# Patient Record
Sex: Female | Born: 1935
Health system: Southern US, Community
[De-identification: ages and names within clinical notes are randomized; demographics above are authoritative.]

## PROBLEM LIST (undated history)

## (undated) DIAGNOSIS — I739 Peripheral vascular disease, unspecified: Secondary | ICD-10-CM

## (undated) DIAGNOSIS — I2510 Atherosclerotic heart disease of native coronary artery without angina pectoris: Secondary | ICD-10-CM

## (undated) DIAGNOSIS — E119 Type 2 diabetes mellitus without complications: Secondary | ICD-10-CM

## (undated) DIAGNOSIS — D509 Iron deficiency anemia, unspecified: Secondary | ICD-10-CM

## (undated) DIAGNOSIS — K5790 Diverticulosis of intestine, part unspecified, without perforation or abscess without bleeding: Secondary | ICD-10-CM

## (undated) DIAGNOSIS — J309 Allergic rhinitis, unspecified: Secondary | ICD-10-CM

## (undated) DIAGNOSIS — I10 Essential (primary) hypertension: Secondary | ICD-10-CM

## (undated) DIAGNOSIS — E785 Hyperlipidemia, unspecified: Secondary | ICD-10-CM

## (undated) DIAGNOSIS — I5022 Chronic systolic (congestive) heart failure: Secondary | ICD-10-CM

## (undated) DIAGNOSIS — D481 Neoplasm of uncertain behavior of connective and other soft tissue: Secondary | ICD-10-CM

## (undated) DIAGNOSIS — G629 Polyneuropathy, unspecified: Secondary | ICD-10-CM

## (undated) HISTORY — DX: Type 2 diabetes mellitus without complications: E11.9

## (undated) HISTORY — PX: OTHER SURGICAL HISTORY: SHX169

## (undated) HISTORY — DX: Polyneuropathy, unspecified: G62.9

## (undated) HISTORY — DX: Atherosclerotic heart disease of native coronary artery without angina pectoris: I25.10

## (undated) HISTORY — DX: Diverticulosis of intestine, part unspecified, without perforation or abscess without bleeding: K57.90

## (undated) HISTORY — PX: BACK SURGERY: SHX140

## (undated) HISTORY — DX: Hyperlipidemia, unspecified: E78.5

## (undated) HISTORY — DX: Peripheral vascular disease, unspecified: I73.9

## (undated) HISTORY — DX: Iron deficiency anemia, unspecified: D50.9

## (undated) HISTORY — DX: Allergic rhinitis, unspecified: J30.9

---

## 2003-08-02 ENCOUNTER — Ambulatory Visit (HOSPITAL_BASED_OUTPATIENT_CLINIC_OR_DEPARTMENT_OTHER): Admission: RE | Admit: 2003-08-02 | Discharge: 2003-08-02 | Payer: Self-pay | Admitting: Orthopedic Surgery

## 2003-08-02 ENCOUNTER — Encounter: Payer: Self-pay | Admitting: Orthopedic Surgery

## 2003-09-22 ENCOUNTER — Ambulatory Visit (HOSPITAL_COMMUNITY): Admission: RE | Admit: 2003-09-22 | Discharge: 2003-09-22 | Payer: Self-pay | Admitting: Orthopedic Surgery

## 2003-09-22 ENCOUNTER — Ambulatory Visit (HOSPITAL_BASED_OUTPATIENT_CLINIC_OR_DEPARTMENT_OTHER): Admission: RE | Admit: 2003-09-22 | Discharge: 2003-09-22 | Payer: Self-pay | Admitting: Orthopedic Surgery

## 2009-12-01 HISTORY — PX: CORONARY ARTERY BYPASS GRAFT: SHX141

## 2013-12-08 DIAGNOSIS — E782 Mixed hyperlipidemia: Secondary | ICD-10-CM | POA: Diagnosis not present

## 2013-12-08 DIAGNOSIS — I10 Essential (primary) hypertension: Secondary | ICD-10-CM | POA: Diagnosis not present

## 2013-12-08 DIAGNOSIS — N390 Urinary tract infection, site not specified: Secondary | ICD-10-CM | POA: Diagnosis not present

## 2013-12-08 DIAGNOSIS — R7309 Other abnormal glucose: Secondary | ICD-10-CM | POA: Diagnosis not present

## 2013-12-08 DIAGNOSIS — E119 Type 2 diabetes mellitus without complications: Secondary | ICD-10-CM | POA: Diagnosis not present

## 2013-12-08 DIAGNOSIS — Z0000 Encounter for general adult medical examination without abnormal findings: Secondary | ICD-10-CM | POA: Diagnosis not present

## 2013-12-08 DIAGNOSIS — F411 Generalized anxiety disorder: Secondary | ICD-10-CM | POA: Diagnosis not present

## 2013-12-14 DIAGNOSIS — E782 Mixed hyperlipidemia: Secondary | ICD-10-CM | POA: Diagnosis not present

## 2013-12-14 DIAGNOSIS — I2510 Atherosclerotic heart disease of native coronary artery without angina pectoris: Secondary | ICD-10-CM | POA: Diagnosis not present

## 2013-12-14 DIAGNOSIS — Z6828 Body mass index (BMI) 28.0-28.9, adult: Secondary | ICD-10-CM | POA: Diagnosis not present

## 2013-12-14 DIAGNOSIS — I10 Essential (primary) hypertension: Secondary | ICD-10-CM | POA: Diagnosis not present

## 2013-12-14 DIAGNOSIS — F411 Generalized anxiety disorder: Secondary | ICD-10-CM | POA: Diagnosis not present

## 2013-12-14 DIAGNOSIS — E119 Type 2 diabetes mellitus without complications: Secondary | ICD-10-CM | POA: Diagnosis not present

## 2013-12-22 DIAGNOSIS — E1149 Type 2 diabetes mellitus with other diabetic neurological complication: Secondary | ICD-10-CM | POA: Diagnosis not present

## 2013-12-22 DIAGNOSIS — B351 Tinea unguium: Secondary | ICD-10-CM | POA: Diagnosis not present

## 2013-12-27 DIAGNOSIS — I6529 Occlusion and stenosis of unspecified carotid artery: Secondary | ICD-10-CM | POA: Diagnosis not present

## 2013-12-27 DIAGNOSIS — I2510 Atherosclerotic heart disease of native coronary artery without angina pectoris: Secondary | ICD-10-CM | POA: Diagnosis not present

## 2013-12-27 DIAGNOSIS — I4891 Unspecified atrial fibrillation: Secondary | ICD-10-CM | POA: Diagnosis not present

## 2013-12-27 DIAGNOSIS — I2589 Other forms of chronic ischemic heart disease: Secondary | ICD-10-CM | POA: Diagnosis not present

## 2013-12-27 DIAGNOSIS — E119 Type 2 diabetes mellitus without complications: Secondary | ICD-10-CM | POA: Diagnosis not present

## 2013-12-27 DIAGNOSIS — E785 Hyperlipidemia, unspecified: Secondary | ICD-10-CM | POA: Diagnosis not present

## 2013-12-27 DIAGNOSIS — I10 Essential (primary) hypertension: Secondary | ICD-10-CM | POA: Diagnosis not present

## 2013-12-27 DIAGNOSIS — I059 Rheumatic mitral valve disease, unspecified: Secondary | ICD-10-CM | POA: Diagnosis not present

## 2013-12-29 DIAGNOSIS — Z79899 Other long term (current) drug therapy: Secondary | ICD-10-CM | POA: Diagnosis not present

## 2013-12-29 DIAGNOSIS — M5030 Other cervical disc degeneration, unspecified cervical region: Secondary | ICD-10-CM | POA: Diagnosis not present

## 2013-12-29 DIAGNOSIS — M5137 Other intervertebral disc degeneration, lumbosacral region: Secondary | ICD-10-CM | POA: Diagnosis not present

## 2014-01-12 DIAGNOSIS — Z7901 Long term (current) use of anticoagulants: Secondary | ICD-10-CM | POA: Diagnosis not present

## 2014-01-12 DIAGNOSIS — S0100XA Unspecified open wound of scalp, initial encounter: Secondary | ICD-10-CM | POA: Diagnosis not present

## 2014-01-12 DIAGNOSIS — M542 Cervicalgia: Secondary | ICD-10-CM | POA: Diagnosis not present

## 2014-01-12 DIAGNOSIS — T1490XA Injury, unspecified, initial encounter: Secondary | ICD-10-CM | POA: Diagnosis not present

## 2014-01-12 DIAGNOSIS — S0990XA Unspecified injury of head, initial encounter: Secondary | ICD-10-CM | POA: Diagnosis not present

## 2014-01-12 DIAGNOSIS — S42009A Fracture of unspecified part of unspecified clavicle, initial encounter for closed fracture: Secondary | ICD-10-CM | POA: Diagnosis not present

## 2014-01-12 DIAGNOSIS — R079 Chest pain, unspecified: Secondary | ICD-10-CM | POA: Diagnosis not present

## 2014-01-12 DIAGNOSIS — R9431 Abnormal electrocardiogram [ECG] [EKG]: Secondary | ICD-10-CM | POA: Diagnosis not present

## 2014-01-12 DIAGNOSIS — R51 Headache: Secondary | ICD-10-CM | POA: Diagnosis not present

## 2014-01-12 DIAGNOSIS — M549 Dorsalgia, unspecified: Secondary | ICD-10-CM | POA: Diagnosis not present

## 2014-01-19 DIAGNOSIS — R269 Unspecified abnormalities of gait and mobility: Secondary | ICD-10-CM | POA: Diagnosis not present

## 2014-01-19 DIAGNOSIS — Z6827 Body mass index (BMI) 27.0-27.9, adult: Secondary | ICD-10-CM | POA: Diagnosis not present

## 2014-02-13 DIAGNOSIS — Z6826 Body mass index (BMI) 26.0-26.9, adult: Secondary | ICD-10-CM | POA: Diagnosis not present

## 2014-02-13 DIAGNOSIS — I10 Essential (primary) hypertension: Secondary | ICD-10-CM | POA: Diagnosis not present

## 2014-02-13 DIAGNOSIS — E119 Type 2 diabetes mellitus without complications: Secondary | ICD-10-CM | POA: Diagnosis not present

## 2014-02-13 DIAGNOSIS — I2510 Atherosclerotic heart disease of native coronary artery without angina pectoris: Secondary | ICD-10-CM | POA: Diagnosis not present

## 2014-02-13 DIAGNOSIS — R269 Unspecified abnormalities of gait and mobility: Secondary | ICD-10-CM | POA: Diagnosis not present

## 2014-02-13 DIAGNOSIS — F411 Generalized anxiety disorder: Secondary | ICD-10-CM | POA: Diagnosis not present

## 2014-02-13 DIAGNOSIS — E782 Mixed hyperlipidemia: Secondary | ICD-10-CM | POA: Diagnosis not present

## 2014-02-15 DIAGNOSIS — M810 Age-related osteoporosis without current pathological fracture: Secondary | ICD-10-CM | POA: Diagnosis not present

## 2014-02-15 DIAGNOSIS — Z79899 Other long term (current) drug therapy: Secondary | ICD-10-CM | POA: Diagnosis not present

## 2014-02-15 DIAGNOSIS — M461 Sacroiliitis, not elsewhere classified: Secondary | ICD-10-CM | POA: Diagnosis not present

## 2014-02-15 DIAGNOSIS — M961 Postlaminectomy syndrome, not elsewhere classified: Secondary | ICD-10-CM | POA: Diagnosis not present

## 2014-02-23 DIAGNOSIS — E1149 Type 2 diabetes mellitus with other diabetic neurological complication: Secondary | ICD-10-CM | POA: Diagnosis not present

## 2014-02-23 DIAGNOSIS — R609 Edema, unspecified: Secondary | ICD-10-CM | POA: Diagnosis not present

## 2014-02-23 DIAGNOSIS — B351 Tinea unguium: Secondary | ICD-10-CM | POA: Diagnosis not present

## 2014-02-23 DIAGNOSIS — M79609 Pain in unspecified limb: Secondary | ICD-10-CM | POA: Diagnosis not present

## 2014-02-24 DIAGNOSIS — N390 Urinary tract infection, site not specified: Secondary | ICD-10-CM | POA: Diagnosis not present

## 2014-02-24 DIAGNOSIS — M79609 Pain in unspecified limb: Secondary | ICD-10-CM | POA: Diagnosis not present

## 2014-02-24 DIAGNOSIS — E1149 Type 2 diabetes mellitus with other diabetic neurological complication: Secondary | ICD-10-CM | POA: Diagnosis not present

## 2014-02-24 DIAGNOSIS — E1142 Type 2 diabetes mellitus with diabetic polyneuropathy: Secondary | ICD-10-CM | POA: Diagnosis not present

## 2014-02-24 DIAGNOSIS — R509 Fever, unspecified: Secondary | ICD-10-CM | POA: Diagnosis not present

## 2014-03-31 DIAGNOSIS — S72109A Unspecified trochanteric fracture of unspecified femur, initial encounter for closed fracture: Secondary | ICD-10-CM | POA: Diagnosis not present

## 2014-03-31 DIAGNOSIS — R269 Unspecified abnormalities of gait and mobility: Secondary | ICD-10-CM | POA: Diagnosis present

## 2014-03-31 DIAGNOSIS — I4891 Unspecified atrial fibrillation: Secondary | ICD-10-CM | POA: Diagnosis not present

## 2014-03-31 DIAGNOSIS — M47817 Spondylosis without myelopathy or radiculopathy, lumbosacral region: Secondary | ICD-10-CM | POA: Diagnosis present

## 2014-03-31 DIAGNOSIS — T1490XA Injury, unspecified, initial encounter: Secondary | ICD-10-CM | POA: Diagnosis not present

## 2014-03-31 DIAGNOSIS — Z951 Presence of aortocoronary bypass graft: Secondary | ICD-10-CM | POA: Diagnosis not present

## 2014-03-31 DIAGNOSIS — R42 Dizziness and giddiness: Secondary | ICD-10-CM | POA: Diagnosis present

## 2014-03-31 DIAGNOSIS — I498 Other specified cardiac arrhythmias: Secondary | ICD-10-CM | POA: Diagnosis not present

## 2014-03-31 DIAGNOSIS — G2581 Restless legs syndrome: Secondary | ICD-10-CM | POA: Diagnosis present

## 2014-03-31 DIAGNOSIS — S7290XA Unspecified fracture of unspecified femur, initial encounter for closed fracture: Secondary | ICD-10-CM | POA: Diagnosis not present

## 2014-03-31 DIAGNOSIS — G988 Other disorders of nervous system: Secondary | ICD-10-CM | POA: Diagnosis not present

## 2014-03-31 DIAGNOSIS — R079 Chest pain, unspecified: Secondary | ICD-10-CM | POA: Diagnosis not present

## 2014-03-31 DIAGNOSIS — G5790 Unspecified mononeuropathy of unspecified lower limb: Secondary | ICD-10-CM | POA: Diagnosis not present

## 2014-03-31 DIAGNOSIS — I10 Essential (primary) hypertension: Secondary | ICD-10-CM | POA: Diagnosis present

## 2014-03-31 DIAGNOSIS — M25559 Pain in unspecified hip: Secondary | ICD-10-CM | POA: Diagnosis not present

## 2014-03-31 DIAGNOSIS — E119 Type 2 diabetes mellitus without complications: Secondary | ICD-10-CM | POA: Diagnosis not present

## 2014-03-31 DIAGNOSIS — J449 Chronic obstructive pulmonary disease, unspecified: Secondary | ICD-10-CM | POA: Diagnosis present

## 2014-04-03 DIAGNOSIS — E119 Type 2 diabetes mellitus without complications: Secondary | ICD-10-CM | POA: Diagnosis not present

## 2014-04-03 DIAGNOSIS — G609 Hereditary and idiopathic neuropathy, unspecified: Secondary | ICD-10-CM | POA: Diagnosis not present

## 2014-04-03 DIAGNOSIS — I2510 Atherosclerotic heart disease of native coronary artery without angina pectoris: Secondary | ICD-10-CM | POA: Diagnosis not present

## 2014-04-03 DIAGNOSIS — W19XXXA Unspecified fall, initial encounter: Secondary | ICD-10-CM | POA: Diagnosis not present

## 2014-04-03 DIAGNOSIS — S72009D Fracture of unspecified part of neck of unspecified femur, subsequent encounter for closed fracture with routine healing: Secondary | ICD-10-CM | POA: Diagnosis not present

## 2014-04-03 DIAGNOSIS — S60919A Unspecified superficial injury of unspecified wrist, initial encounter: Secondary | ICD-10-CM | POA: Diagnosis not present

## 2014-04-03 DIAGNOSIS — J449 Chronic obstructive pulmonary disease, unspecified: Secondary | ICD-10-CM | POA: Diagnosis not present

## 2014-04-03 DIAGNOSIS — I739 Peripheral vascular disease, unspecified: Secondary | ICD-10-CM | POA: Diagnosis not present

## 2014-04-03 DIAGNOSIS — I10 Essential (primary) hypertension: Secondary | ICD-10-CM | POA: Diagnosis not present

## 2014-04-03 DIAGNOSIS — S50909A Unspecified superficial injury of unspecified elbow, initial encounter: Secondary | ICD-10-CM | POA: Diagnosis not present

## 2014-04-03 DIAGNOSIS — M129 Arthropathy, unspecified: Secondary | ICD-10-CM | POA: Diagnosis not present

## 2014-04-04 DIAGNOSIS — I739 Peripheral vascular disease, unspecified: Secondary | ICD-10-CM | POA: Diagnosis not present

## 2014-04-04 DIAGNOSIS — G609 Hereditary and idiopathic neuropathy, unspecified: Secondary | ICD-10-CM | POA: Diagnosis not present

## 2014-04-04 DIAGNOSIS — I2510 Atherosclerotic heart disease of native coronary artery without angina pectoris: Secondary | ICD-10-CM | POA: Diagnosis not present

## 2014-04-04 DIAGNOSIS — E119 Type 2 diabetes mellitus without complications: Secondary | ICD-10-CM | POA: Diagnosis not present

## 2014-04-04 DIAGNOSIS — J449 Chronic obstructive pulmonary disease, unspecified: Secondary | ICD-10-CM | POA: Diagnosis not present

## 2014-04-04 DIAGNOSIS — S72009D Fracture of unspecified part of neck of unspecified femur, subsequent encounter for closed fracture with routine healing: Secondary | ICD-10-CM | POA: Diagnosis not present

## 2014-04-05 DIAGNOSIS — I2510 Atherosclerotic heart disease of native coronary artery without angina pectoris: Secondary | ICD-10-CM | POA: Diagnosis not present

## 2014-04-05 DIAGNOSIS — G609 Hereditary and idiopathic neuropathy, unspecified: Secondary | ICD-10-CM | POA: Diagnosis not present

## 2014-04-05 DIAGNOSIS — J449 Chronic obstructive pulmonary disease, unspecified: Secondary | ICD-10-CM | POA: Diagnosis not present

## 2014-04-05 DIAGNOSIS — S72009D Fracture of unspecified part of neck of unspecified femur, subsequent encounter for closed fracture with routine healing: Secondary | ICD-10-CM | POA: Diagnosis not present

## 2014-04-05 DIAGNOSIS — I739 Peripheral vascular disease, unspecified: Secondary | ICD-10-CM | POA: Diagnosis not present

## 2014-04-05 DIAGNOSIS — E119 Type 2 diabetes mellitus without complications: Secondary | ICD-10-CM | POA: Diagnosis not present

## 2014-04-07 DIAGNOSIS — E119 Type 2 diabetes mellitus without complications: Secondary | ICD-10-CM | POA: Diagnosis not present

## 2014-04-07 DIAGNOSIS — I2510 Atherosclerotic heart disease of native coronary artery without angina pectoris: Secondary | ICD-10-CM | POA: Diagnosis not present

## 2014-04-07 DIAGNOSIS — G609 Hereditary and idiopathic neuropathy, unspecified: Secondary | ICD-10-CM | POA: Diagnosis not present

## 2014-04-07 DIAGNOSIS — S72009D Fracture of unspecified part of neck of unspecified femur, subsequent encounter for closed fracture with routine healing: Secondary | ICD-10-CM | POA: Diagnosis not present

## 2014-04-07 DIAGNOSIS — J449 Chronic obstructive pulmonary disease, unspecified: Secondary | ICD-10-CM | POA: Diagnosis not present

## 2014-04-07 DIAGNOSIS — I739 Peripheral vascular disease, unspecified: Secondary | ICD-10-CM | POA: Diagnosis not present

## 2014-04-10 DIAGNOSIS — I739 Peripheral vascular disease, unspecified: Secondary | ICD-10-CM | POA: Diagnosis not present

## 2014-04-10 DIAGNOSIS — I2510 Atherosclerotic heart disease of native coronary artery without angina pectoris: Secondary | ICD-10-CM | POA: Diagnosis not present

## 2014-04-10 DIAGNOSIS — G609 Hereditary and idiopathic neuropathy, unspecified: Secondary | ICD-10-CM | POA: Diagnosis not present

## 2014-04-10 DIAGNOSIS — E119 Type 2 diabetes mellitus without complications: Secondary | ICD-10-CM | POA: Diagnosis not present

## 2014-04-10 DIAGNOSIS — J449 Chronic obstructive pulmonary disease, unspecified: Secondary | ICD-10-CM | POA: Diagnosis not present

## 2014-04-10 DIAGNOSIS — S72009D Fracture of unspecified part of neck of unspecified femur, subsequent encounter for closed fracture with routine healing: Secondary | ICD-10-CM | POA: Diagnosis not present

## 2014-04-12 DIAGNOSIS — I739 Peripheral vascular disease, unspecified: Secondary | ICD-10-CM | POA: Diagnosis not present

## 2014-04-12 DIAGNOSIS — I2510 Atherosclerotic heart disease of native coronary artery without angina pectoris: Secondary | ICD-10-CM | POA: Diagnosis not present

## 2014-04-12 DIAGNOSIS — E119 Type 2 diabetes mellitus without complications: Secondary | ICD-10-CM | POA: Diagnosis not present

## 2014-04-12 DIAGNOSIS — J449 Chronic obstructive pulmonary disease, unspecified: Secondary | ICD-10-CM | POA: Diagnosis not present

## 2014-04-12 DIAGNOSIS — S72009D Fracture of unspecified part of neck of unspecified femur, subsequent encounter for closed fracture with routine healing: Secondary | ICD-10-CM | POA: Diagnosis not present

## 2014-04-12 DIAGNOSIS — G609 Hereditary and idiopathic neuropathy, unspecified: Secondary | ICD-10-CM | POA: Diagnosis not present

## 2014-04-13 DIAGNOSIS — M461 Sacroiliitis, not elsewhere classified: Secondary | ICD-10-CM | POA: Diagnosis not present

## 2014-04-13 DIAGNOSIS — R262 Difficulty in walking, not elsewhere classified: Secondary | ICD-10-CM | POA: Diagnosis not present

## 2014-04-13 DIAGNOSIS — Z79899 Other long term (current) drug therapy: Secondary | ICD-10-CM | POA: Diagnosis not present

## 2014-04-13 DIAGNOSIS — S3210XA Unspecified fracture of sacrum, initial encounter for closed fracture: Secondary | ICD-10-CM | POA: Diagnosis not present

## 2014-04-13 DIAGNOSIS — M961 Postlaminectomy syndrome, not elsewhere classified: Secondary | ICD-10-CM | POA: Diagnosis not present

## 2014-04-14 DIAGNOSIS — J449 Chronic obstructive pulmonary disease, unspecified: Secondary | ICD-10-CM | POA: Diagnosis not present

## 2014-04-14 DIAGNOSIS — I2510 Atherosclerotic heart disease of native coronary artery without angina pectoris: Secondary | ICD-10-CM | POA: Diagnosis not present

## 2014-04-14 DIAGNOSIS — G609 Hereditary and idiopathic neuropathy, unspecified: Secondary | ICD-10-CM | POA: Diagnosis not present

## 2014-04-14 DIAGNOSIS — S72009D Fracture of unspecified part of neck of unspecified femur, subsequent encounter for closed fracture with routine healing: Secondary | ICD-10-CM | POA: Diagnosis not present

## 2014-04-14 DIAGNOSIS — I739 Peripheral vascular disease, unspecified: Secondary | ICD-10-CM | POA: Diagnosis not present

## 2014-04-14 DIAGNOSIS — E119 Type 2 diabetes mellitus without complications: Secondary | ICD-10-CM | POA: Diagnosis not present

## 2014-04-18 DIAGNOSIS — J449 Chronic obstructive pulmonary disease, unspecified: Secondary | ICD-10-CM | POA: Diagnosis not present

## 2014-04-18 DIAGNOSIS — S72009D Fracture of unspecified part of neck of unspecified femur, subsequent encounter for closed fracture with routine healing: Secondary | ICD-10-CM | POA: Diagnosis not present

## 2014-04-18 DIAGNOSIS — G609 Hereditary and idiopathic neuropathy, unspecified: Secondary | ICD-10-CM | POA: Diagnosis not present

## 2014-04-18 DIAGNOSIS — E119 Type 2 diabetes mellitus without complications: Secondary | ICD-10-CM | POA: Diagnosis not present

## 2014-04-18 DIAGNOSIS — I739 Peripheral vascular disease, unspecified: Secondary | ICD-10-CM | POA: Diagnosis not present

## 2014-04-18 DIAGNOSIS — I2510 Atherosclerotic heart disease of native coronary artery without angina pectoris: Secondary | ICD-10-CM | POA: Diagnosis not present

## 2014-04-19 DIAGNOSIS — I739 Peripheral vascular disease, unspecified: Secondary | ICD-10-CM | POA: Diagnosis not present

## 2014-04-19 DIAGNOSIS — J449 Chronic obstructive pulmonary disease, unspecified: Secondary | ICD-10-CM | POA: Diagnosis not present

## 2014-04-19 DIAGNOSIS — E119 Type 2 diabetes mellitus without complications: Secondary | ICD-10-CM | POA: Diagnosis not present

## 2014-04-19 DIAGNOSIS — I2510 Atherosclerotic heart disease of native coronary artery without angina pectoris: Secondary | ICD-10-CM | POA: Diagnosis not present

## 2014-04-19 DIAGNOSIS — G609 Hereditary and idiopathic neuropathy, unspecified: Secondary | ICD-10-CM | POA: Diagnosis not present

## 2014-04-19 DIAGNOSIS — S72009D Fracture of unspecified part of neck of unspecified femur, subsequent encounter for closed fracture with routine healing: Secondary | ICD-10-CM | POA: Diagnosis not present

## 2014-04-20 DIAGNOSIS — E119 Type 2 diabetes mellitus without complications: Secondary | ICD-10-CM | POA: Diagnosis not present

## 2014-04-20 DIAGNOSIS — S72009D Fracture of unspecified part of neck of unspecified femur, subsequent encounter for closed fracture with routine healing: Secondary | ICD-10-CM | POA: Diagnosis not present

## 2014-04-20 DIAGNOSIS — G609 Hereditary and idiopathic neuropathy, unspecified: Secondary | ICD-10-CM | POA: Diagnosis not present

## 2014-04-20 DIAGNOSIS — I739 Peripheral vascular disease, unspecified: Secondary | ICD-10-CM | POA: Diagnosis not present

## 2014-04-20 DIAGNOSIS — I2510 Atherosclerotic heart disease of native coronary artery without angina pectoris: Secondary | ICD-10-CM | POA: Diagnosis not present

## 2014-04-20 DIAGNOSIS — J449 Chronic obstructive pulmonary disease, unspecified: Secondary | ICD-10-CM | POA: Diagnosis not present

## 2014-04-22 DIAGNOSIS — I739 Peripheral vascular disease, unspecified: Secondary | ICD-10-CM | POA: Diagnosis not present

## 2014-04-22 DIAGNOSIS — S72009D Fracture of unspecified part of neck of unspecified femur, subsequent encounter for closed fracture with routine healing: Secondary | ICD-10-CM | POA: Diagnosis not present

## 2014-04-22 DIAGNOSIS — J449 Chronic obstructive pulmonary disease, unspecified: Secondary | ICD-10-CM | POA: Diagnosis not present

## 2014-04-22 DIAGNOSIS — G609 Hereditary and idiopathic neuropathy, unspecified: Secondary | ICD-10-CM | POA: Diagnosis not present

## 2014-04-22 DIAGNOSIS — I2510 Atherosclerotic heart disease of native coronary artery without angina pectoris: Secondary | ICD-10-CM | POA: Diagnosis not present

## 2014-04-22 DIAGNOSIS — E119 Type 2 diabetes mellitus without complications: Secondary | ICD-10-CM | POA: Diagnosis not present

## 2014-04-23 DIAGNOSIS — H10239 Serous conjunctivitis, except viral, unspecified eye: Secondary | ICD-10-CM | POA: Diagnosis not present

## 2014-04-25 DIAGNOSIS — J449 Chronic obstructive pulmonary disease, unspecified: Secondary | ICD-10-CM | POA: Diagnosis not present

## 2014-04-25 DIAGNOSIS — I739 Peripheral vascular disease, unspecified: Secondary | ICD-10-CM | POA: Diagnosis not present

## 2014-04-25 DIAGNOSIS — E119 Type 2 diabetes mellitus without complications: Secondary | ICD-10-CM | POA: Diagnosis not present

## 2014-04-25 DIAGNOSIS — I2510 Atherosclerotic heart disease of native coronary artery without angina pectoris: Secondary | ICD-10-CM | POA: Diagnosis not present

## 2014-04-25 DIAGNOSIS — S72009D Fracture of unspecified part of neck of unspecified femur, subsequent encounter for closed fracture with routine healing: Secondary | ICD-10-CM | POA: Diagnosis not present

## 2014-04-25 DIAGNOSIS — G609 Hereditary and idiopathic neuropathy, unspecified: Secondary | ICD-10-CM | POA: Diagnosis not present

## 2014-04-27 DIAGNOSIS — R609 Edema, unspecified: Secondary | ICD-10-CM | POA: Diagnosis not present

## 2014-04-27 DIAGNOSIS — B351 Tinea unguium: Secondary | ICD-10-CM | POA: Diagnosis not present

## 2014-04-27 DIAGNOSIS — E1149 Type 2 diabetes mellitus with other diabetic neurological complication: Secondary | ICD-10-CM | POA: Diagnosis not present

## 2014-04-28 DIAGNOSIS — S72009D Fracture of unspecified part of neck of unspecified femur, subsequent encounter for closed fracture with routine healing: Secondary | ICD-10-CM | POA: Diagnosis not present

## 2014-04-28 DIAGNOSIS — I2510 Atherosclerotic heart disease of native coronary artery without angina pectoris: Secondary | ICD-10-CM | POA: Diagnosis not present

## 2014-04-28 DIAGNOSIS — G609 Hereditary and idiopathic neuropathy, unspecified: Secondary | ICD-10-CM | POA: Diagnosis not present

## 2014-04-28 DIAGNOSIS — I739 Peripheral vascular disease, unspecified: Secondary | ICD-10-CM | POA: Diagnosis not present

## 2014-04-28 DIAGNOSIS — E119 Type 2 diabetes mellitus without complications: Secondary | ICD-10-CM | POA: Diagnosis not present

## 2014-04-28 DIAGNOSIS — J449 Chronic obstructive pulmonary disease, unspecified: Secondary | ICD-10-CM | POA: Diagnosis not present

## 2014-04-30 DIAGNOSIS — S72009D Fracture of unspecified part of neck of unspecified femur, subsequent encounter for closed fracture with routine healing: Secondary | ICD-10-CM | POA: Diagnosis not present

## 2014-04-30 DIAGNOSIS — J449 Chronic obstructive pulmonary disease, unspecified: Secondary | ICD-10-CM | POA: Diagnosis not present

## 2014-04-30 DIAGNOSIS — G609 Hereditary and idiopathic neuropathy, unspecified: Secondary | ICD-10-CM | POA: Diagnosis not present

## 2014-04-30 DIAGNOSIS — I2510 Atherosclerotic heart disease of native coronary artery without angina pectoris: Secondary | ICD-10-CM | POA: Diagnosis not present

## 2014-04-30 DIAGNOSIS — E119 Type 2 diabetes mellitus without complications: Secondary | ICD-10-CM | POA: Diagnosis not present

## 2014-04-30 DIAGNOSIS — I739 Peripheral vascular disease, unspecified: Secondary | ICD-10-CM | POA: Diagnosis not present

## 2014-05-01 DIAGNOSIS — S72009D Fracture of unspecified part of neck of unspecified femur, subsequent encounter for closed fracture with routine healing: Secondary | ICD-10-CM | POA: Diagnosis not present

## 2014-05-02 DIAGNOSIS — G609 Hereditary and idiopathic neuropathy, unspecified: Secondary | ICD-10-CM | POA: Diagnosis not present

## 2014-05-02 DIAGNOSIS — J449 Chronic obstructive pulmonary disease, unspecified: Secondary | ICD-10-CM | POA: Diagnosis not present

## 2014-05-02 DIAGNOSIS — S72009D Fracture of unspecified part of neck of unspecified femur, subsequent encounter for closed fracture with routine healing: Secondary | ICD-10-CM | POA: Diagnosis not present

## 2014-05-02 DIAGNOSIS — E119 Type 2 diabetes mellitus without complications: Secondary | ICD-10-CM | POA: Diagnosis not present

## 2014-05-02 DIAGNOSIS — I739 Peripheral vascular disease, unspecified: Secondary | ICD-10-CM | POA: Diagnosis not present

## 2014-05-02 DIAGNOSIS — I2510 Atherosclerotic heart disease of native coronary artery without angina pectoris: Secondary | ICD-10-CM | POA: Diagnosis not present

## 2014-05-04 DIAGNOSIS — G609 Hereditary and idiopathic neuropathy, unspecified: Secondary | ICD-10-CM | POA: Diagnosis not present

## 2014-05-04 DIAGNOSIS — I2510 Atherosclerotic heart disease of native coronary artery without angina pectoris: Secondary | ICD-10-CM | POA: Diagnosis not present

## 2014-05-04 DIAGNOSIS — E119 Type 2 diabetes mellitus without complications: Secondary | ICD-10-CM | POA: Diagnosis not present

## 2014-05-04 DIAGNOSIS — J449 Chronic obstructive pulmonary disease, unspecified: Secondary | ICD-10-CM | POA: Diagnosis not present

## 2014-05-04 DIAGNOSIS — I739 Peripheral vascular disease, unspecified: Secondary | ICD-10-CM | POA: Diagnosis not present

## 2014-05-04 DIAGNOSIS — S72009D Fracture of unspecified part of neck of unspecified femur, subsequent encounter for closed fracture with routine healing: Secondary | ICD-10-CM | POA: Diagnosis not present

## 2014-05-10 DIAGNOSIS — R269 Unspecified abnormalities of gait and mobility: Secondary | ICD-10-CM | POA: Diagnosis not present

## 2014-05-10 DIAGNOSIS — F411 Generalized anxiety disorder: Secondary | ICD-10-CM | POA: Diagnosis not present

## 2014-05-10 DIAGNOSIS — I10 Essential (primary) hypertension: Secondary | ICD-10-CM | POA: Diagnosis not present

## 2014-05-10 DIAGNOSIS — Z6826 Body mass index (BMI) 26.0-26.9, adult: Secondary | ICD-10-CM | POA: Diagnosis not present

## 2014-05-10 DIAGNOSIS — E782 Mixed hyperlipidemia: Secondary | ICD-10-CM | POA: Diagnosis not present

## 2014-05-10 DIAGNOSIS — I2510 Atherosclerotic heart disease of native coronary artery without angina pectoris: Secondary | ICD-10-CM | POA: Diagnosis not present

## 2014-05-10 DIAGNOSIS — E119 Type 2 diabetes mellitus without complications: Secondary | ICD-10-CM | POA: Diagnosis not present

## 2014-05-11 DIAGNOSIS — M961 Postlaminectomy syndrome, not elsewhere classified: Secondary | ICD-10-CM | POA: Diagnosis not present

## 2014-05-11 DIAGNOSIS — M5137 Other intervertebral disc degeneration, lumbosacral region: Secondary | ICD-10-CM | POA: Diagnosis not present

## 2014-05-11 DIAGNOSIS — M5030 Other cervical disc degeneration, unspecified cervical region: Secondary | ICD-10-CM | POA: Diagnosis not present

## 2014-05-11 DIAGNOSIS — G4701 Insomnia due to medical condition: Secondary | ICD-10-CM | POA: Diagnosis not present

## 2014-05-17 DIAGNOSIS — Z6825 Body mass index (BMI) 25.0-25.9, adult: Secondary | ICD-10-CM | POA: Diagnosis not present

## 2014-05-17 DIAGNOSIS — I10 Essential (primary) hypertension: Secondary | ICD-10-CM | POA: Diagnosis not present

## 2014-05-17 DIAGNOSIS — E119 Type 2 diabetes mellitus without complications: Secondary | ICD-10-CM | POA: Diagnosis not present

## 2014-05-17 DIAGNOSIS — F411 Generalized anxiety disorder: Secondary | ICD-10-CM | POA: Diagnosis not present

## 2014-05-17 DIAGNOSIS — I2510 Atherosclerotic heart disease of native coronary artery without angina pectoris: Secondary | ICD-10-CM | POA: Diagnosis not present

## 2014-05-17 DIAGNOSIS — R269 Unspecified abnormalities of gait and mobility: Secondary | ICD-10-CM | POA: Diagnosis not present

## 2014-05-17 DIAGNOSIS — E782 Mixed hyperlipidemia: Secondary | ICD-10-CM | POA: Diagnosis not present

## 2014-05-22 DIAGNOSIS — S72009D Fracture of unspecified part of neck of unspecified femur, subsequent encounter for closed fracture with routine healing: Secondary | ICD-10-CM | POA: Diagnosis not present

## 2014-06-09 DIAGNOSIS — M961 Postlaminectomy syndrome, not elsewhere classified: Secondary | ICD-10-CM | POA: Diagnosis not present

## 2014-06-09 DIAGNOSIS — Z79899 Other long term (current) drug therapy: Secondary | ICD-10-CM | POA: Diagnosis not present

## 2014-06-09 DIAGNOSIS — M5137 Other intervertebral disc degeneration, lumbosacral region: Secondary | ICD-10-CM | POA: Diagnosis not present

## 2014-06-09 DIAGNOSIS — M5030 Other cervical disc degeneration, unspecified cervical region: Secondary | ICD-10-CM | POA: Diagnosis not present

## 2014-06-15 DIAGNOSIS — H43819 Vitreous degeneration, unspecified eye: Secondary | ICD-10-CM | POA: Diagnosis not present

## 2014-06-15 DIAGNOSIS — E119 Type 2 diabetes mellitus without complications: Secondary | ICD-10-CM | POA: Diagnosis not present

## 2014-06-15 DIAGNOSIS — Z0100 Encounter for examination of eyes and vision without abnormal findings: Secondary | ICD-10-CM | POA: Diagnosis not present

## 2014-06-15 DIAGNOSIS — H01139 Eczematous dermatitis of unspecified eye, unspecified eyelid: Secondary | ICD-10-CM | POA: Diagnosis not present

## 2014-06-26 DIAGNOSIS — S72009D Fracture of unspecified part of neck of unspecified femur, subsequent encounter for closed fracture with routine healing: Secondary | ICD-10-CM | POA: Diagnosis not present

## 2014-06-29 DIAGNOSIS — B351 Tinea unguium: Secondary | ICD-10-CM | POA: Diagnosis not present

## 2014-06-29 DIAGNOSIS — M2040 Other hammer toe(s) (acquired), unspecified foot: Secondary | ICD-10-CM | POA: Diagnosis not present

## 2014-06-29 DIAGNOSIS — E1149 Type 2 diabetes mellitus with other diabetic neurological complication: Secondary | ICD-10-CM | POA: Diagnosis not present

## 2014-07-06 DIAGNOSIS — I2510 Atherosclerotic heart disease of native coronary artery without angina pectoris: Secondary | ICD-10-CM | POA: Diagnosis not present

## 2014-07-06 DIAGNOSIS — E119 Type 2 diabetes mellitus without complications: Secondary | ICD-10-CM | POA: Diagnosis not present

## 2014-07-06 DIAGNOSIS — R269 Unspecified abnormalities of gait and mobility: Secondary | ICD-10-CM | POA: Diagnosis not present

## 2014-07-06 DIAGNOSIS — F411 Generalized anxiety disorder: Secondary | ICD-10-CM | POA: Diagnosis not present

## 2014-07-06 DIAGNOSIS — I10 Essential (primary) hypertension: Secondary | ICD-10-CM | POA: Diagnosis not present

## 2014-07-06 DIAGNOSIS — E782 Mixed hyperlipidemia: Secondary | ICD-10-CM | POA: Diagnosis not present

## 2014-07-10 DIAGNOSIS — Z79899 Other long term (current) drug therapy: Secondary | ICD-10-CM | POA: Diagnosis not present

## 2014-07-10 DIAGNOSIS — IMO0002 Reserved for concepts with insufficient information to code with codable children: Secondary | ICD-10-CM | POA: Diagnosis not present

## 2014-07-10 DIAGNOSIS — M47812 Spondylosis without myelopathy or radiculopathy, cervical region: Secondary | ICD-10-CM | POA: Diagnosis not present

## 2014-07-10 DIAGNOSIS — M961 Postlaminectomy syndrome, not elsewhere classified: Secondary | ICD-10-CM | POA: Diagnosis not present

## 2014-07-11 DIAGNOSIS — S0990XA Unspecified injury of head, initial encounter: Secondary | ICD-10-CM | POA: Diagnosis not present

## 2014-07-11 DIAGNOSIS — S4980XA Other specified injuries of shoulder and upper arm, unspecified arm, initial encounter: Secondary | ICD-10-CM | POA: Diagnosis not present

## 2014-07-11 DIAGNOSIS — S0190XA Unspecified open wound of unspecified part of head, initial encounter: Secondary | ICD-10-CM | POA: Diagnosis not present

## 2014-07-11 DIAGNOSIS — R51 Headache: Secondary | ICD-10-CM | POA: Diagnosis not present

## 2014-07-11 DIAGNOSIS — S40019A Contusion of unspecified shoulder, initial encounter: Secondary | ICD-10-CM | POA: Diagnosis not present

## 2014-07-11 DIAGNOSIS — S199XXA Unspecified injury of neck, initial encounter: Secondary | ICD-10-CM | POA: Diagnosis not present

## 2014-07-11 DIAGNOSIS — M545 Low back pain, unspecified: Secondary | ICD-10-CM | POA: Diagnosis not present

## 2014-07-11 DIAGNOSIS — Z981 Arthrodesis status: Secondary | ICD-10-CM | POA: Diagnosis not present

## 2014-07-11 DIAGNOSIS — S32009A Unspecified fracture of unspecified lumbar vertebra, initial encounter for closed fracture: Secondary | ICD-10-CM | POA: Diagnosis not present

## 2014-07-11 DIAGNOSIS — M5030 Other cervical disc degeneration, unspecified cervical region: Secondary | ICD-10-CM | POA: Diagnosis not present

## 2014-07-11 DIAGNOSIS — S0180XA Unspecified open wound of other part of head, initial encounter: Secondary | ICD-10-CM | POA: Diagnosis not present

## 2014-07-11 DIAGNOSIS — S0993XA Unspecified injury of face, initial encounter: Secondary | ICD-10-CM | POA: Diagnosis not present

## 2014-07-11 DIAGNOSIS — S0100XA Unspecified open wound of scalp, initial encounter: Secondary | ICD-10-CM | POA: Diagnosis not present

## 2014-07-11 DIAGNOSIS — M542 Cervicalgia: Secondary | ICD-10-CM | POA: Diagnosis not present

## 2014-07-11 DIAGNOSIS — S46909A Unspecified injury of unspecified muscle, fascia and tendon at shoulder and upper arm level, unspecified arm, initial encounter: Secondary | ICD-10-CM | POA: Diagnosis not present

## 2014-07-11 DIAGNOSIS — M25519 Pain in unspecified shoulder: Secondary | ICD-10-CM | POA: Diagnosis not present

## 2014-07-11 DIAGNOSIS — IMO0002 Reserved for concepts with insufficient information to code with codable children: Secondary | ICD-10-CM | POA: Diagnosis not present

## 2014-07-21 DIAGNOSIS — E782 Mixed hyperlipidemia: Secondary | ICD-10-CM | POA: Diagnosis not present

## 2014-07-21 DIAGNOSIS — R269 Unspecified abnormalities of gait and mobility: Secondary | ICD-10-CM | POA: Diagnosis not present

## 2014-07-21 DIAGNOSIS — F411 Generalized anxiety disorder: Secondary | ICD-10-CM | POA: Diagnosis not present

## 2014-07-21 DIAGNOSIS — E119 Type 2 diabetes mellitus without complications: Secondary | ICD-10-CM | POA: Diagnosis not present

## 2014-07-21 DIAGNOSIS — I2510 Atherosclerotic heart disease of native coronary artery without angina pectoris: Secondary | ICD-10-CM | POA: Diagnosis not present

## 2014-07-21 DIAGNOSIS — I10 Essential (primary) hypertension: Secondary | ICD-10-CM | POA: Diagnosis not present

## 2014-08-23 DIAGNOSIS — IMO0002 Reserved for concepts with insufficient information to code with codable children: Secondary | ICD-10-CM | POA: Diagnosis not present

## 2014-08-23 DIAGNOSIS — T84498A Other mechanical complication of other internal orthopedic devices, implants and grafts, initial encounter: Secondary | ICD-10-CM | POA: Diagnosis not present

## 2014-08-23 DIAGNOSIS — R262 Difficulty in walking, not elsewhere classified: Secondary | ICD-10-CM | POA: Diagnosis not present

## 2014-08-23 DIAGNOSIS — M810 Age-related osteoporosis without current pathological fracture: Secondary | ICD-10-CM | POA: Diagnosis not present

## 2014-08-23 DIAGNOSIS — M961 Postlaminectomy syndrome, not elsewhere classified: Secondary | ICD-10-CM | POA: Diagnosis not present

## 2014-08-31 DIAGNOSIS — B351 Tinea unguium: Secondary | ICD-10-CM | POA: Diagnosis not present

## 2014-08-31 DIAGNOSIS — S93515A Sprain of interphalangeal joint of left lesser toe(s), initial encounter: Secondary | ICD-10-CM | POA: Diagnosis not present

## 2014-08-31 DIAGNOSIS — M2042 Other hammer toe(s) (acquired), left foot: Secondary | ICD-10-CM | POA: Diagnosis not present

## 2014-08-31 DIAGNOSIS — L218 Other seborrheic dermatitis: Secondary | ICD-10-CM | POA: Diagnosis not present

## 2014-09-06 DIAGNOSIS — T85192A Other mechanical complication of implanted electronic neurostimulator (electrode) of spinal cord, initial encounter: Secondary | ICD-10-CM | POA: Diagnosis not present

## 2014-09-06 DIAGNOSIS — M7989 Other specified soft tissue disorders: Secondary | ICD-10-CM | POA: Diagnosis not present

## 2014-09-06 DIAGNOSIS — T85118A Breakdown (mechanical) of other implanted electronic stimulator of nervous system, initial encounter: Secondary | ICD-10-CM | POA: Diagnosis not present

## 2014-09-06 DIAGNOSIS — T8584XA Pain due to internal prosthetic devices, implants and grafts, not elsewhere classified, initial encounter: Secondary | ICD-10-CM | POA: Diagnosis not present

## 2014-09-14 DIAGNOSIS — M21372 Foot drop, left foot: Secondary | ICD-10-CM | POA: Diagnosis not present

## 2014-09-14 DIAGNOSIS — M21371 Foot drop, right foot: Secondary | ICD-10-CM | POA: Diagnosis not present

## 2014-09-28 DIAGNOSIS — Z6825 Body mass index (BMI) 25.0-25.9, adult: Secondary | ICD-10-CM | POA: Diagnosis not present

## 2014-09-28 DIAGNOSIS — I2584 Coronary atherosclerosis due to calcified coronary lesion: Secondary | ICD-10-CM | POA: Diagnosis not present

## 2014-09-28 DIAGNOSIS — E782 Mixed hyperlipidemia: Secondary | ICD-10-CM | POA: Diagnosis not present

## 2014-09-28 DIAGNOSIS — Z23 Encounter for immunization: Secondary | ICD-10-CM | POA: Diagnosis not present

## 2014-09-28 DIAGNOSIS — I10 Essential (primary) hypertension: Secondary | ICD-10-CM | POA: Diagnosis not present

## 2014-09-28 DIAGNOSIS — M792 Neuralgia and neuritis, unspecified: Secondary | ICD-10-CM | POA: Diagnosis not present

## 2014-09-28 DIAGNOSIS — E119 Type 2 diabetes mellitus without complications: Secondary | ICD-10-CM | POA: Diagnosis not present

## 2014-09-28 DIAGNOSIS — F411 Generalized anxiety disorder: Secondary | ICD-10-CM | POA: Diagnosis not present

## 2014-10-17 DIAGNOSIS — M961 Postlaminectomy syndrome, not elsewhere classified: Secondary | ICD-10-CM | POA: Diagnosis not present

## 2014-10-17 DIAGNOSIS — M810 Age-related osteoporosis without current pathological fracture: Secondary | ICD-10-CM | POA: Diagnosis not present

## 2014-10-17 DIAGNOSIS — M4806 Spinal stenosis, lumbar region: Secondary | ICD-10-CM | POA: Diagnosis not present

## 2014-10-17 DIAGNOSIS — M5416 Radiculopathy, lumbar region: Secondary | ICD-10-CM | POA: Diagnosis not present

## 2014-10-17 DIAGNOSIS — Z79891 Long term (current) use of opiate analgesic: Secondary | ICD-10-CM | POA: Diagnosis not present

## 2014-10-18 DIAGNOSIS — M4806 Spinal stenosis, lumbar region: Secondary | ICD-10-CM | POA: Diagnosis not present

## 2014-10-18 DIAGNOSIS — Z9181 History of falling: Secondary | ICD-10-CM | POA: Diagnosis not present

## 2014-10-18 DIAGNOSIS — M961 Postlaminectomy syndrome, not elsewhere classified: Secondary | ICD-10-CM | POA: Diagnosis not present

## 2014-10-18 DIAGNOSIS — Z7902 Long term (current) use of antithrombotics/antiplatelets: Secondary | ICD-10-CM | POA: Diagnosis not present

## 2014-10-18 DIAGNOSIS — I10 Essential (primary) hypertension: Secondary | ICD-10-CM | POA: Diagnosis not present

## 2014-10-18 DIAGNOSIS — Z7982 Long term (current) use of aspirin: Secondary | ICD-10-CM | POA: Diagnosis not present

## 2014-10-18 DIAGNOSIS — R269 Unspecified abnormalities of gait and mobility: Secondary | ICD-10-CM | POA: Diagnosis not present

## 2014-10-18 DIAGNOSIS — I2510 Atherosclerotic heart disease of native coronary artery without angina pectoris: Secondary | ICD-10-CM | POA: Diagnosis not present

## 2014-10-18 DIAGNOSIS — M810 Age-related osteoporosis without current pathological fracture: Secondary | ICD-10-CM | POA: Diagnosis not present

## 2014-10-18 DIAGNOSIS — E119 Type 2 diabetes mellitus without complications: Secondary | ICD-10-CM | POA: Diagnosis not present

## 2014-10-18 DIAGNOSIS — G8929 Other chronic pain: Secondary | ICD-10-CM | POA: Diagnosis not present

## 2014-10-19 DIAGNOSIS — M961 Postlaminectomy syndrome, not elsewhere classified: Secondary | ICD-10-CM | POA: Diagnosis not present

## 2014-10-19 DIAGNOSIS — I2510 Atherosclerotic heart disease of native coronary artery without angina pectoris: Secondary | ICD-10-CM | POA: Diagnosis not present

## 2014-10-19 DIAGNOSIS — R269 Unspecified abnormalities of gait and mobility: Secondary | ICD-10-CM | POA: Diagnosis not present

## 2014-10-19 DIAGNOSIS — G8929 Other chronic pain: Secondary | ICD-10-CM | POA: Diagnosis not present

## 2014-10-19 DIAGNOSIS — M810 Age-related osteoporosis without current pathological fracture: Secondary | ICD-10-CM | POA: Diagnosis not present

## 2014-10-19 DIAGNOSIS — M4806 Spinal stenosis, lumbar region: Secondary | ICD-10-CM | POA: Diagnosis not present

## 2014-10-20 DIAGNOSIS — G8929 Other chronic pain: Secondary | ICD-10-CM | POA: Diagnosis not present

## 2014-10-20 DIAGNOSIS — M4806 Spinal stenosis, lumbar region: Secondary | ICD-10-CM | POA: Diagnosis not present

## 2014-10-20 DIAGNOSIS — M961 Postlaminectomy syndrome, not elsewhere classified: Secondary | ICD-10-CM | POA: Diagnosis not present

## 2014-10-20 DIAGNOSIS — I2510 Atherosclerotic heart disease of native coronary artery without angina pectoris: Secondary | ICD-10-CM | POA: Diagnosis not present

## 2014-10-20 DIAGNOSIS — R269 Unspecified abnormalities of gait and mobility: Secondary | ICD-10-CM | POA: Diagnosis not present

## 2014-10-20 DIAGNOSIS — M810 Age-related osteoporosis without current pathological fracture: Secondary | ICD-10-CM | POA: Diagnosis not present

## 2014-10-23 DIAGNOSIS — I2510 Atherosclerotic heart disease of native coronary artery without angina pectoris: Secondary | ICD-10-CM | POA: Diagnosis not present

## 2014-10-23 DIAGNOSIS — M810 Age-related osteoporosis without current pathological fracture: Secondary | ICD-10-CM | POA: Diagnosis not present

## 2014-10-23 DIAGNOSIS — R269 Unspecified abnormalities of gait and mobility: Secondary | ICD-10-CM | POA: Diagnosis not present

## 2014-10-23 DIAGNOSIS — M4806 Spinal stenosis, lumbar region: Secondary | ICD-10-CM | POA: Diagnosis not present

## 2014-10-23 DIAGNOSIS — G8929 Other chronic pain: Secondary | ICD-10-CM | POA: Diagnosis not present

## 2014-10-23 DIAGNOSIS — M961 Postlaminectomy syndrome, not elsewhere classified: Secondary | ICD-10-CM | POA: Diagnosis not present

## 2014-10-24 DIAGNOSIS — R269 Unspecified abnormalities of gait and mobility: Secondary | ICD-10-CM | POA: Diagnosis not present

## 2014-10-24 DIAGNOSIS — M961 Postlaminectomy syndrome, not elsewhere classified: Secondary | ICD-10-CM | POA: Diagnosis not present

## 2014-10-24 DIAGNOSIS — M4806 Spinal stenosis, lumbar region: Secondary | ICD-10-CM | POA: Diagnosis not present

## 2014-10-24 DIAGNOSIS — M810 Age-related osteoporosis without current pathological fracture: Secondary | ICD-10-CM | POA: Diagnosis not present

## 2014-10-24 DIAGNOSIS — G8929 Other chronic pain: Secondary | ICD-10-CM | POA: Diagnosis not present

## 2014-10-24 DIAGNOSIS — I2510 Atherosclerotic heart disease of native coronary artery without angina pectoris: Secondary | ICD-10-CM | POA: Diagnosis not present

## 2014-10-25 DIAGNOSIS — M810 Age-related osteoporosis without current pathological fracture: Secondary | ICD-10-CM | POA: Diagnosis not present

## 2014-10-25 DIAGNOSIS — M961 Postlaminectomy syndrome, not elsewhere classified: Secondary | ICD-10-CM | POA: Diagnosis not present

## 2014-10-25 DIAGNOSIS — M4806 Spinal stenosis, lumbar region: Secondary | ICD-10-CM | POA: Diagnosis not present

## 2014-10-25 DIAGNOSIS — I2510 Atherosclerotic heart disease of native coronary artery without angina pectoris: Secondary | ICD-10-CM | POA: Diagnosis not present

## 2014-10-25 DIAGNOSIS — R269 Unspecified abnormalities of gait and mobility: Secondary | ICD-10-CM | POA: Diagnosis not present

## 2014-10-25 DIAGNOSIS — G8929 Other chronic pain: Secondary | ICD-10-CM | POA: Diagnosis not present

## 2014-10-30 DIAGNOSIS — M4806 Spinal stenosis, lumbar region: Secondary | ICD-10-CM | POA: Diagnosis not present

## 2014-10-30 DIAGNOSIS — G8929 Other chronic pain: Secondary | ICD-10-CM | POA: Diagnosis not present

## 2014-10-30 DIAGNOSIS — M961 Postlaminectomy syndrome, not elsewhere classified: Secondary | ICD-10-CM | POA: Diagnosis not present

## 2014-10-30 DIAGNOSIS — R269 Unspecified abnormalities of gait and mobility: Secondary | ICD-10-CM | POA: Diagnosis not present

## 2014-10-30 DIAGNOSIS — M810 Age-related osteoporosis without current pathological fracture: Secondary | ICD-10-CM | POA: Diagnosis not present

## 2014-10-30 DIAGNOSIS — I2510 Atherosclerotic heart disease of native coronary artery without angina pectoris: Secondary | ICD-10-CM | POA: Diagnosis not present

## 2014-11-01 DIAGNOSIS — I2510 Atherosclerotic heart disease of native coronary artery without angina pectoris: Secondary | ICD-10-CM | POA: Diagnosis not present

## 2014-11-01 DIAGNOSIS — M4806 Spinal stenosis, lumbar region: Secondary | ICD-10-CM | POA: Diagnosis not present

## 2014-11-01 DIAGNOSIS — M810 Age-related osteoporosis without current pathological fracture: Secondary | ICD-10-CM | POA: Diagnosis not present

## 2014-11-01 DIAGNOSIS — M961 Postlaminectomy syndrome, not elsewhere classified: Secondary | ICD-10-CM | POA: Diagnosis not present

## 2014-11-01 DIAGNOSIS — G8929 Other chronic pain: Secondary | ICD-10-CM | POA: Diagnosis not present

## 2014-11-01 DIAGNOSIS — R269 Unspecified abnormalities of gait and mobility: Secondary | ICD-10-CM | POA: Diagnosis not present

## 2014-12-11 DIAGNOSIS — Z79899 Other long term (current) drug therapy: Secondary | ICD-10-CM | POA: Diagnosis not present

## 2014-12-11 DIAGNOSIS — M542 Cervicalgia: Secondary | ICD-10-CM | POA: Diagnosis not present

## 2014-12-11 DIAGNOSIS — R269 Unspecified abnormalities of gait and mobility: Secondary | ICD-10-CM | POA: Diagnosis not present

## 2014-12-11 DIAGNOSIS — Z23 Encounter for immunization: Secondary | ICD-10-CM | POA: Diagnosis not present

## 2014-12-11 DIAGNOSIS — E119 Type 2 diabetes mellitus without complications: Secondary | ICD-10-CM | POA: Diagnosis not present

## 2014-12-11 DIAGNOSIS — I10 Essential (primary) hypertension: Secondary | ICD-10-CM | POA: Diagnosis not present

## 2014-12-11 DIAGNOSIS — D539 Nutritional anemia, unspecified: Secondary | ICD-10-CM | POA: Diagnosis not present

## 2014-12-19 DIAGNOSIS — R269 Unspecified abnormalities of gait and mobility: Secondary | ICD-10-CM | POA: Diagnosis not present

## 2014-12-19 DIAGNOSIS — M6281 Muscle weakness (generalized): Secondary | ICD-10-CM | POA: Diagnosis not present

## 2014-12-19 DIAGNOSIS — R279 Unspecified lack of coordination: Secondary | ICD-10-CM | POA: Diagnosis not present

## 2014-12-21 DIAGNOSIS — R279 Unspecified lack of coordination: Secondary | ICD-10-CM | POA: Diagnosis not present

## 2014-12-21 DIAGNOSIS — R269 Unspecified abnormalities of gait and mobility: Secondary | ICD-10-CM | POA: Diagnosis not present

## 2014-12-21 DIAGNOSIS — M6281 Muscle weakness (generalized): Secondary | ICD-10-CM | POA: Diagnosis not present

## 2014-12-25 DIAGNOSIS — R269 Unspecified abnormalities of gait and mobility: Secondary | ICD-10-CM | POA: Diagnosis not present

## 2014-12-25 DIAGNOSIS — R279 Unspecified lack of coordination: Secondary | ICD-10-CM | POA: Diagnosis not present

## 2014-12-25 DIAGNOSIS — M6281 Muscle weakness (generalized): Secondary | ICD-10-CM | POA: Diagnosis not present

## 2014-12-27 DIAGNOSIS — M6281 Muscle weakness (generalized): Secondary | ICD-10-CM | POA: Diagnosis not present

## 2014-12-27 DIAGNOSIS — R279 Unspecified lack of coordination: Secondary | ICD-10-CM | POA: Diagnosis not present

## 2014-12-27 DIAGNOSIS — R269 Unspecified abnormalities of gait and mobility: Secondary | ICD-10-CM | POA: Diagnosis not present

## 2014-12-28 DIAGNOSIS — R279 Unspecified lack of coordination: Secondary | ICD-10-CM | POA: Diagnosis not present

## 2014-12-28 DIAGNOSIS — M6281 Muscle weakness (generalized): Secondary | ICD-10-CM | POA: Diagnosis not present

## 2014-12-28 DIAGNOSIS — R269 Unspecified abnormalities of gait and mobility: Secondary | ICD-10-CM | POA: Diagnosis not present

## 2015-01-01 DIAGNOSIS — M6281 Muscle weakness (generalized): Secondary | ICD-10-CM | POA: Diagnosis not present

## 2015-01-01 DIAGNOSIS — R279 Unspecified lack of coordination: Secondary | ICD-10-CM | POA: Diagnosis not present

## 2015-01-01 DIAGNOSIS — R278 Other lack of coordination: Secondary | ICD-10-CM | POA: Diagnosis not present

## 2015-01-01 DIAGNOSIS — R269 Unspecified abnormalities of gait and mobility: Secondary | ICD-10-CM | POA: Diagnosis not present

## 2015-01-02 DIAGNOSIS — I10 Essential (primary) hypertension: Secondary | ICD-10-CM | POA: Diagnosis not present

## 2015-01-02 DIAGNOSIS — E611 Iron deficiency: Secondary | ICD-10-CM | POA: Diagnosis not present

## 2015-01-03 DIAGNOSIS — R278 Other lack of coordination: Secondary | ICD-10-CM | POA: Diagnosis not present

## 2015-01-03 DIAGNOSIS — M6281 Muscle weakness (generalized): Secondary | ICD-10-CM | POA: Diagnosis not present

## 2015-01-03 DIAGNOSIS — R269 Unspecified abnormalities of gait and mobility: Secondary | ICD-10-CM | POA: Diagnosis not present

## 2015-01-03 DIAGNOSIS — R279 Unspecified lack of coordination: Secondary | ICD-10-CM | POA: Diagnosis not present

## 2015-01-04 DIAGNOSIS — R279 Unspecified lack of coordination: Secondary | ICD-10-CM | POA: Diagnosis not present

## 2015-01-04 DIAGNOSIS — R278 Other lack of coordination: Secondary | ICD-10-CM | POA: Diagnosis not present

## 2015-01-04 DIAGNOSIS — M6281 Muscle weakness (generalized): Secondary | ICD-10-CM | POA: Diagnosis not present

## 2015-01-04 DIAGNOSIS — R269 Unspecified abnormalities of gait and mobility: Secondary | ICD-10-CM | POA: Diagnosis not present

## 2015-01-09 DIAGNOSIS — R279 Unspecified lack of coordination: Secondary | ICD-10-CM | POA: Diagnosis not present

## 2015-01-09 DIAGNOSIS — R278 Other lack of coordination: Secondary | ICD-10-CM | POA: Diagnosis not present

## 2015-01-09 DIAGNOSIS — M6281 Muscle weakness (generalized): Secondary | ICD-10-CM | POA: Diagnosis not present

## 2015-01-09 DIAGNOSIS — R269 Unspecified abnormalities of gait and mobility: Secondary | ICD-10-CM | POA: Diagnosis not present

## 2015-01-10 DIAGNOSIS — R269 Unspecified abnormalities of gait and mobility: Secondary | ICD-10-CM | POA: Diagnosis not present

## 2015-01-10 DIAGNOSIS — R278 Other lack of coordination: Secondary | ICD-10-CM | POA: Diagnosis not present

## 2015-01-10 DIAGNOSIS — R279 Unspecified lack of coordination: Secondary | ICD-10-CM | POA: Diagnosis not present

## 2015-01-10 DIAGNOSIS — M6281 Muscle weakness (generalized): Secondary | ICD-10-CM | POA: Diagnosis not present

## 2015-01-11 DIAGNOSIS — Z79899 Other long term (current) drug therapy: Secondary | ICD-10-CM | POA: Diagnosis not present

## 2015-01-12 DIAGNOSIS — M6281 Muscle weakness (generalized): Secondary | ICD-10-CM | POA: Diagnosis not present

## 2015-01-12 DIAGNOSIS — R279 Unspecified lack of coordination: Secondary | ICD-10-CM | POA: Diagnosis not present

## 2015-01-12 DIAGNOSIS — R278 Other lack of coordination: Secondary | ICD-10-CM | POA: Diagnosis not present

## 2015-01-12 DIAGNOSIS — R269 Unspecified abnormalities of gait and mobility: Secondary | ICD-10-CM | POA: Diagnosis not present

## 2015-01-15 DIAGNOSIS — M6281 Muscle weakness (generalized): Secondary | ICD-10-CM | POA: Diagnosis not present

## 2015-01-15 DIAGNOSIS — R278 Other lack of coordination: Secondary | ICD-10-CM | POA: Diagnosis not present

## 2015-01-15 DIAGNOSIS — R269 Unspecified abnormalities of gait and mobility: Secondary | ICD-10-CM | POA: Diagnosis not present

## 2015-01-15 DIAGNOSIS — R279 Unspecified lack of coordination: Secondary | ICD-10-CM | POA: Diagnosis not present

## 2015-01-16 DIAGNOSIS — M6281 Muscle weakness (generalized): Secondary | ICD-10-CM | POA: Diagnosis not present

## 2015-01-16 DIAGNOSIS — R278 Other lack of coordination: Secondary | ICD-10-CM | POA: Diagnosis not present

## 2015-01-16 DIAGNOSIS — R279 Unspecified lack of coordination: Secondary | ICD-10-CM | POA: Diagnosis not present

## 2015-01-16 DIAGNOSIS — R269 Unspecified abnormalities of gait and mobility: Secondary | ICD-10-CM | POA: Diagnosis not present

## 2015-01-18 DIAGNOSIS — M6281 Muscle weakness (generalized): Secondary | ICD-10-CM | POA: Diagnosis not present

## 2015-01-18 DIAGNOSIS — R279 Unspecified lack of coordination: Secondary | ICD-10-CM | POA: Diagnosis not present

## 2015-01-18 DIAGNOSIS — R278 Other lack of coordination: Secondary | ICD-10-CM | POA: Diagnosis not present

## 2015-01-18 DIAGNOSIS — R269 Unspecified abnormalities of gait and mobility: Secondary | ICD-10-CM | POA: Diagnosis not present

## 2015-01-22 DIAGNOSIS — R269 Unspecified abnormalities of gait and mobility: Secondary | ICD-10-CM | POA: Diagnosis not present

## 2015-01-22 DIAGNOSIS — M6281 Muscle weakness (generalized): Secondary | ICD-10-CM | POA: Diagnosis not present

## 2015-01-22 DIAGNOSIS — R279 Unspecified lack of coordination: Secondary | ICD-10-CM | POA: Diagnosis not present

## 2015-01-22 DIAGNOSIS — R278 Other lack of coordination: Secondary | ICD-10-CM | POA: Diagnosis not present

## 2015-01-24 DIAGNOSIS — R269 Unspecified abnormalities of gait and mobility: Secondary | ICD-10-CM | POA: Diagnosis not present

## 2015-01-24 DIAGNOSIS — R279 Unspecified lack of coordination: Secondary | ICD-10-CM | POA: Diagnosis not present

## 2015-01-24 DIAGNOSIS — R278 Other lack of coordination: Secondary | ICD-10-CM | POA: Diagnosis not present

## 2015-01-24 DIAGNOSIS — M6281 Muscle weakness (generalized): Secondary | ICD-10-CM | POA: Diagnosis not present

## 2015-01-25 DIAGNOSIS — R269 Unspecified abnormalities of gait and mobility: Secondary | ICD-10-CM | POA: Diagnosis not present

## 2015-01-25 DIAGNOSIS — R278 Other lack of coordination: Secondary | ICD-10-CM | POA: Diagnosis not present

## 2015-01-25 DIAGNOSIS — R279 Unspecified lack of coordination: Secondary | ICD-10-CM | POA: Diagnosis not present

## 2015-01-25 DIAGNOSIS — M6281 Muscle weakness (generalized): Secondary | ICD-10-CM | POA: Diagnosis not present

## 2015-01-26 DIAGNOSIS — M6281 Muscle weakness (generalized): Secondary | ICD-10-CM | POA: Diagnosis not present

## 2015-01-26 DIAGNOSIS — R279 Unspecified lack of coordination: Secondary | ICD-10-CM | POA: Diagnosis not present

## 2015-01-26 DIAGNOSIS — R278 Other lack of coordination: Secondary | ICD-10-CM | POA: Diagnosis not present

## 2015-01-26 DIAGNOSIS — R269 Unspecified abnormalities of gait and mobility: Secondary | ICD-10-CM | POA: Diagnosis not present

## 2015-01-29 DIAGNOSIS — R278 Other lack of coordination: Secondary | ICD-10-CM | POA: Diagnosis not present

## 2015-01-29 DIAGNOSIS — R279 Unspecified lack of coordination: Secondary | ICD-10-CM | POA: Diagnosis not present

## 2015-01-29 DIAGNOSIS — R269 Unspecified abnormalities of gait and mobility: Secondary | ICD-10-CM | POA: Diagnosis not present

## 2015-01-29 DIAGNOSIS — M6281 Muscle weakness (generalized): Secondary | ICD-10-CM | POA: Diagnosis not present

## 2015-01-30 DIAGNOSIS — R269 Unspecified abnormalities of gait and mobility: Secondary | ICD-10-CM | POA: Diagnosis not present

## 2015-01-30 DIAGNOSIS — M6281 Muscle weakness (generalized): Secondary | ICD-10-CM | POA: Diagnosis not present

## 2015-01-30 DIAGNOSIS — R278 Other lack of coordination: Secondary | ICD-10-CM | POA: Diagnosis not present

## 2015-01-30 DIAGNOSIS — R279 Unspecified lack of coordination: Secondary | ICD-10-CM | POA: Diagnosis not present

## 2015-01-31 DIAGNOSIS — M6281 Muscle weakness (generalized): Secondary | ICD-10-CM | POA: Diagnosis not present

## 2015-01-31 DIAGNOSIS — R279 Unspecified lack of coordination: Secondary | ICD-10-CM | POA: Diagnosis not present

## 2015-01-31 DIAGNOSIS — R269 Unspecified abnormalities of gait and mobility: Secondary | ICD-10-CM | POA: Diagnosis not present

## 2015-01-31 DIAGNOSIS — R278 Other lack of coordination: Secondary | ICD-10-CM | POA: Diagnosis not present

## 2015-02-01 DIAGNOSIS — R278 Other lack of coordination: Secondary | ICD-10-CM | POA: Diagnosis not present

## 2015-02-01 DIAGNOSIS — R269 Unspecified abnormalities of gait and mobility: Secondary | ICD-10-CM | POA: Diagnosis not present

## 2015-02-01 DIAGNOSIS — R279 Unspecified lack of coordination: Secondary | ICD-10-CM | POA: Diagnosis not present

## 2015-02-01 DIAGNOSIS — M6281 Muscle weakness (generalized): Secondary | ICD-10-CM | POA: Diagnosis not present

## 2015-02-05 DIAGNOSIS — M6281 Muscle weakness (generalized): Secondary | ICD-10-CM | POA: Diagnosis not present

## 2015-02-05 DIAGNOSIS — R279 Unspecified lack of coordination: Secondary | ICD-10-CM | POA: Diagnosis not present

## 2015-02-05 DIAGNOSIS — R269 Unspecified abnormalities of gait and mobility: Secondary | ICD-10-CM | POA: Diagnosis not present

## 2015-02-05 DIAGNOSIS — R278 Other lack of coordination: Secondary | ICD-10-CM | POA: Diagnosis not present

## 2015-02-06 DIAGNOSIS — M6281 Muscle weakness (generalized): Secondary | ICD-10-CM | POA: Diagnosis not present

## 2015-02-06 DIAGNOSIS — R278 Other lack of coordination: Secondary | ICD-10-CM | POA: Diagnosis not present

## 2015-02-06 DIAGNOSIS — R279 Unspecified lack of coordination: Secondary | ICD-10-CM | POA: Diagnosis not present

## 2015-02-06 DIAGNOSIS — R269 Unspecified abnormalities of gait and mobility: Secondary | ICD-10-CM | POA: Diagnosis not present

## 2015-02-08 DIAGNOSIS — R278 Other lack of coordination: Secondary | ICD-10-CM | POA: Diagnosis not present

## 2015-02-08 DIAGNOSIS — M6281 Muscle weakness (generalized): Secondary | ICD-10-CM | POA: Diagnosis not present

## 2015-02-08 DIAGNOSIS — R279 Unspecified lack of coordination: Secondary | ICD-10-CM | POA: Diagnosis not present

## 2015-02-08 DIAGNOSIS — R269 Unspecified abnormalities of gait and mobility: Secondary | ICD-10-CM | POA: Diagnosis not present

## 2015-02-09 DIAGNOSIS — R279 Unspecified lack of coordination: Secondary | ICD-10-CM | POA: Diagnosis not present

## 2015-02-09 DIAGNOSIS — R269 Unspecified abnormalities of gait and mobility: Secondary | ICD-10-CM | POA: Diagnosis not present

## 2015-02-09 DIAGNOSIS — R278 Other lack of coordination: Secondary | ICD-10-CM | POA: Diagnosis not present

## 2015-02-09 DIAGNOSIS — M6281 Muscle weakness (generalized): Secondary | ICD-10-CM | POA: Diagnosis not present

## 2015-02-12 DIAGNOSIS — M1712 Unilateral primary osteoarthritis, left knee: Secondary | ICD-10-CM | POA: Diagnosis not present

## 2015-02-12 DIAGNOSIS — M961 Postlaminectomy syndrome, not elsewhere classified: Secondary | ICD-10-CM | POA: Diagnosis not present

## 2015-02-12 DIAGNOSIS — M1612 Unilateral primary osteoarthritis, left hip: Secondary | ICD-10-CM | POA: Diagnosis not present

## 2015-02-13 DIAGNOSIS — M6281 Muscle weakness (generalized): Secondary | ICD-10-CM | POA: Diagnosis not present

## 2015-02-13 DIAGNOSIS — R279 Unspecified lack of coordination: Secondary | ICD-10-CM | POA: Diagnosis not present

## 2015-02-13 DIAGNOSIS — R269 Unspecified abnormalities of gait and mobility: Secondary | ICD-10-CM | POA: Diagnosis not present

## 2015-02-13 DIAGNOSIS — R278 Other lack of coordination: Secondary | ICD-10-CM | POA: Diagnosis not present

## 2015-02-14 DIAGNOSIS — R279 Unspecified lack of coordination: Secondary | ICD-10-CM | POA: Diagnosis not present

## 2015-02-14 DIAGNOSIS — R269 Unspecified abnormalities of gait and mobility: Secondary | ICD-10-CM | POA: Diagnosis not present

## 2015-02-14 DIAGNOSIS — R278 Other lack of coordination: Secondary | ICD-10-CM | POA: Diagnosis not present

## 2015-02-14 DIAGNOSIS — M6281 Muscle weakness (generalized): Secondary | ICD-10-CM | POA: Diagnosis not present

## 2015-02-15 DIAGNOSIS — M6281 Muscle weakness (generalized): Secondary | ICD-10-CM | POA: Diagnosis not present

## 2015-02-15 DIAGNOSIS — R278 Other lack of coordination: Secondary | ICD-10-CM | POA: Diagnosis not present

## 2015-02-15 DIAGNOSIS — R269 Unspecified abnormalities of gait and mobility: Secondary | ICD-10-CM | POA: Diagnosis not present

## 2015-02-15 DIAGNOSIS — R279 Unspecified lack of coordination: Secondary | ICD-10-CM | POA: Diagnosis not present

## 2015-02-16 DIAGNOSIS — M6281 Muscle weakness (generalized): Secondary | ICD-10-CM | POA: Diagnosis not present

## 2015-02-16 DIAGNOSIS — M160 Bilateral primary osteoarthritis of hip: Secondary | ICD-10-CM | POA: Diagnosis not present

## 2015-02-16 DIAGNOSIS — R278 Other lack of coordination: Secondary | ICD-10-CM | POA: Diagnosis not present

## 2015-02-16 DIAGNOSIS — R269 Unspecified abnormalities of gait and mobility: Secondary | ICD-10-CM | POA: Diagnosis not present

## 2015-02-16 DIAGNOSIS — R279 Unspecified lack of coordination: Secondary | ICD-10-CM | POA: Diagnosis not present

## 2015-02-19 DIAGNOSIS — R269 Unspecified abnormalities of gait and mobility: Secondary | ICD-10-CM | POA: Diagnosis not present

## 2015-02-19 DIAGNOSIS — M6281 Muscle weakness (generalized): Secondary | ICD-10-CM | POA: Diagnosis not present

## 2015-02-19 DIAGNOSIS — R279 Unspecified lack of coordination: Secondary | ICD-10-CM | POA: Diagnosis not present

## 2015-02-19 DIAGNOSIS — R278 Other lack of coordination: Secondary | ICD-10-CM | POA: Diagnosis not present

## 2015-02-21 DIAGNOSIS — R279 Unspecified lack of coordination: Secondary | ICD-10-CM | POA: Diagnosis not present

## 2015-02-21 DIAGNOSIS — R278 Other lack of coordination: Secondary | ICD-10-CM | POA: Diagnosis not present

## 2015-02-21 DIAGNOSIS — R269 Unspecified abnormalities of gait and mobility: Secondary | ICD-10-CM | POA: Diagnosis not present

## 2015-02-21 DIAGNOSIS — M6281 Muscle weakness (generalized): Secondary | ICD-10-CM | POA: Diagnosis not present

## 2015-02-22 DIAGNOSIS — R278 Other lack of coordination: Secondary | ICD-10-CM | POA: Diagnosis not present

## 2015-02-22 DIAGNOSIS — R279 Unspecified lack of coordination: Secondary | ICD-10-CM | POA: Diagnosis not present

## 2015-02-22 DIAGNOSIS — M6281 Muscle weakness (generalized): Secondary | ICD-10-CM | POA: Diagnosis not present

## 2015-02-22 DIAGNOSIS — R269 Unspecified abnormalities of gait and mobility: Secondary | ICD-10-CM | POA: Diagnosis not present

## 2015-02-26 DIAGNOSIS — R269 Unspecified abnormalities of gait and mobility: Secondary | ICD-10-CM | POA: Diagnosis not present

## 2015-02-26 DIAGNOSIS — M6281 Muscle weakness (generalized): Secondary | ICD-10-CM | POA: Diagnosis not present

## 2015-02-26 DIAGNOSIS — R278 Other lack of coordination: Secondary | ICD-10-CM | POA: Diagnosis not present

## 2015-02-26 DIAGNOSIS — R279 Unspecified lack of coordination: Secondary | ICD-10-CM | POA: Diagnosis not present

## 2015-02-27 DIAGNOSIS — R278 Other lack of coordination: Secondary | ICD-10-CM | POA: Diagnosis not present

## 2015-02-27 DIAGNOSIS — M6281 Muscle weakness (generalized): Secondary | ICD-10-CM | POA: Diagnosis not present

## 2015-02-27 DIAGNOSIS — R269 Unspecified abnormalities of gait and mobility: Secondary | ICD-10-CM | POA: Diagnosis not present

## 2015-02-27 DIAGNOSIS — R279 Unspecified lack of coordination: Secondary | ICD-10-CM | POA: Diagnosis not present

## 2015-02-28 DIAGNOSIS — R269 Unspecified abnormalities of gait and mobility: Secondary | ICD-10-CM | POA: Diagnosis not present

## 2015-02-28 DIAGNOSIS — M6281 Muscle weakness (generalized): Secondary | ICD-10-CM | POA: Diagnosis not present

## 2015-02-28 DIAGNOSIS — R278 Other lack of coordination: Secondary | ICD-10-CM | POA: Diagnosis not present

## 2015-02-28 DIAGNOSIS — R279 Unspecified lack of coordination: Secondary | ICD-10-CM | POA: Diagnosis not present

## 2015-03-01 DIAGNOSIS — M6281 Muscle weakness (generalized): Secondary | ICD-10-CM | POA: Diagnosis not present

## 2015-03-01 DIAGNOSIS — R278 Other lack of coordination: Secondary | ICD-10-CM | POA: Diagnosis not present

## 2015-03-01 DIAGNOSIS — R279 Unspecified lack of coordination: Secondary | ICD-10-CM | POA: Diagnosis not present

## 2015-03-01 DIAGNOSIS — R269 Unspecified abnormalities of gait and mobility: Secondary | ICD-10-CM | POA: Diagnosis not present

## 2015-03-04 ENCOUNTER — Encounter (HOSPITAL_COMMUNITY): Payer: Self-pay | Admitting: Nurse Practitioner

## 2015-03-04 ENCOUNTER — Emergency Department (HOSPITAL_COMMUNITY)
Admission: EM | Admit: 2015-03-04 | Discharge: 2015-03-05 | Disposition: A | Discharge status: Home / Self Care | Payer: Medicare Other | Attending: Emergency Medicine | Admitting: Emergency Medicine

## 2015-03-04 DIAGNOSIS — S81819A Laceration without foreign body, unspecified lower leg, initial encounter: Secondary | ICD-10-CM | POA: Diagnosis not present

## 2015-03-04 DIAGNOSIS — S81811A Laceration without foreign body, right lower leg, initial encounter: Secondary | ICD-10-CM | POA: Insufficient documentation

## 2015-03-04 DIAGNOSIS — I10 Essential (primary) hypertension: Secondary | ICD-10-CM | POA: Diagnosis not present

## 2015-03-04 DIAGNOSIS — W19XXXA Unspecified fall, initial encounter: Secondary | ICD-10-CM

## 2015-03-04 DIAGNOSIS — Y9389 Activity, other specified: Secondary | ICD-10-CM | POA: Diagnosis not present

## 2015-03-04 DIAGNOSIS — Z792 Long term (current) use of antibiotics: Secondary | ICD-10-CM | POA: Insufficient documentation

## 2015-03-04 DIAGNOSIS — Y9289 Other specified places as the place of occurrence of the external cause: Secondary | ICD-10-CM | POA: Insufficient documentation

## 2015-03-04 DIAGNOSIS — Z951 Presence of aortocoronary bypass graft: Secondary | ICD-10-CM | POA: Diagnosis not present

## 2015-03-04 DIAGNOSIS — Y998 Other external cause status: Secondary | ICD-10-CM | POA: Diagnosis not present

## 2015-03-04 DIAGNOSIS — W01198A Fall on same level from slipping, tripping and stumbling with subsequent striking against other object, initial encounter: Secondary | ICD-10-CM | POA: Diagnosis not present

## 2015-03-04 DIAGNOSIS — R031 Nonspecific low blood-pressure reading: Secondary | ICD-10-CM | POA: Diagnosis not present

## 2015-03-04 DIAGNOSIS — S51811A Laceration without foreign body of right forearm, initial encounter: Secondary | ICD-10-CM | POA: Insufficient documentation

## 2015-03-04 HISTORY — DX: Essential (primary) hypertension: I10

## 2015-03-04 MED ORDER — LIDOCAINE-EPINEPHRINE (PF) 2 %-1:200000 IJ SOLN
20.0000 mL | Freq: Once | INTRAMUSCULAR | Status: AC
  Administered 2015-03-05: 20 mL
  Filled 2015-03-04: qty 20

## 2015-03-04 NOTE — ED Notes (Signed)
Pt presented from [facility], report of a fall, pt normally has a walker states she "tripped while she was turning" fell injuring her right shin and right forearm both with skin tears, controlled bleeding and a dressing done by the medics. Pt states she is not in any pain as she had "just taken a usual Rx Norco". Of note pt takes plavix.

## 2015-03-04 NOTE — ED Notes (Signed)
Bed: RESA Expected date:  Expected time:  Means of arrival:  Comments: EMS 81 yo female from assisted living/slipped and fell

## 2015-03-05 ENCOUNTER — Other Ambulatory Visit (HOSPITAL_COMMUNITY): Payer: Self-pay

## 2015-03-05 DIAGNOSIS — S51811A Laceration without foreign body of right forearm, initial encounter: Secondary | ICD-10-CM | POA: Diagnosis not present

## 2015-03-05 LAB — I-STAT CHEM 8, ED
BUN: 23 mg/dL (ref 6–23)
CHLORIDE: 99 mmol/L (ref 96–112)
CREATININE: 1 mg/dL (ref 0.50–1.10)
Calcium, Ion: 1.24 mmol/L (ref 1.13–1.30)
Glucose, Bld: 156 mg/dL — ABNORMAL HIGH (ref 70–99)
HCT: 33 % — ABNORMAL LOW (ref 36.0–46.0)
Hemoglobin: 11.2 g/dL — ABNORMAL LOW (ref 12.0–15.0)
POTASSIUM: 4.5 mmol/L (ref 3.5–5.1)
SODIUM: 136 mmol/L (ref 135–145)
TCO2: 24 mmol/L (ref 0–100)

## 2015-03-05 MED ORDER — BACITRACIN ZINC 500 UNIT/GM EX OINT
TOPICAL_OINTMENT | CUTANEOUS | Status: AC
  Administered 2015-03-05: 1
  Filled 2015-03-05: qty 0.9

## 2015-03-05 MED ORDER — BACITRACIN ZINC 500 UNIT/GM EX OINT: 1.0000 "application " | TOPICAL_OINTMENT | Freq: Two times a day (BID) | CUTANEOUS | Status: DC

## 2015-03-05 NOTE — Discharge Instructions (Signed)
Laceration Care, Adult °A laceration is a cut or lesion that goes through all layers of the skin and into the tissue just beneath the skin. °TREATMENT  °Some lacerations may not require closure. Some lacerations may not be able to be closed due to an increased risk of infection. It is important to see your caregiver as soon as possible after an injury to minimize the risk of infection and maximize the opportunity for successful closure. °If closure is appropriate, pain medicines may be given, if needed. The wound will be cleaned to help prevent infection. Your caregiver will use stitches (sutures), staples, wound glue (adhesive), or skin adhesive strips to repair the laceration. These tools bring the skin edges together to allow for faster healing and a better cosmetic outcome. However, all wounds will heal with a scar. Once the wound has healed, scarring can be minimized by covering the wound with sunscreen during the day for 1 full year. °HOME CARE INSTRUCTIONS  °For sutures or staples: °· Keep the wound clean and dry. °· If you were given a bandage (dressing), you should change it at least once a day. Also, change the dressing if it becomes wet or dirty, or as directed by your caregiver. °· Wash the wound with soap and water 2 times a day. Rinse the wound off with water to remove all soap. Pat the wound dry with a clean towel. °· After cleaning, apply a thin layer of the antibiotic ointment as recommended by your caregiver. This will help prevent infection and keep the dressing from sticking. °· You may shower as usual after the first 24 hours. Do not soak the wound in water until the sutures are removed. °· Only take over-the-counter or prescription medicines for pain, discomfort, or fever as directed by your caregiver. °· Get your sutures or staples removed as directed by your caregiver. °For skin adhesive strips: °· Keep the wound clean and dry. °· Do not get the skin adhesive strips wet. You may bathe  carefully, using caution to keep the wound dry. °· If the wound gets wet, pat it dry with a clean towel. °· Skin adhesive strips will fall off on their own. You may trim the strips as the wound heals. Do not remove skin adhesive strips that are still stuck to the wound. They will fall off in time. °For wound adhesive: °· You may briefly wet your wound in the shower or bath. Do not soak or scrub the wound. Do not swim. Avoid periods of heavy perspiration until the skin adhesive has fallen off on its own. After showering or bathing, gently pat the wound dry with a clean towel. °· Do not apply liquid medicine, cream medicine, or ointment medicine to your wound while the skin adhesive is in place. This may loosen the film before your wound is healed. °· If a dressing is placed over the wound, be careful not to apply tape directly over the skin adhesive. This may cause the adhesive to be pulled off before the wound is healed. °· Avoid prolonged exposure to sunlight or tanning lamps while the skin adhesive is in place. Exposure to ultraviolet light in the first year will darken the scar. °· The skin adhesive will usually remain in place for 5 to 10 days, then naturally fall off the skin. Do not pick at the adhesive film. °You may need a tetanus shot if: °· You cannot remember when you had your last tetanus shot. °· You have never had a tetanus   shot. If you get a tetanus shot, your arm may swell, get red, and feel warm to the touch. This is common and not a problem. If you need a tetanus shot and you choose not to have one, there is a rare chance of getting tetanus. Sickness from tetanus can be serious. SEEK MEDICAL CARE IF:   You have redness, swelling, or increasing pain in the wound.  You see a red line that goes away from the wound.  You have yellowish-white fluid (pus) coming from the wound.  You have a fever.  You notice a bad smell coming from the wound or dressing.  Your wound breaks open before or  after sutures have been removed.  You notice something coming out of the wound such as wood or glass.  Your wound is on your hand or foot and you cannot move a finger or toe. SEEK IMMEDIATE MEDICAL CARE IF:   Your pain is not controlled with prescribed medicine.  You have severe swelling around the wound causing pain and numbness or a change in color in your arm, hand, leg, or foot.  Your wound splits open and starts bleeding.  You have worsening numbness, weakness, or loss of function of any joint around or beyond the wound.  You develop painful lumps near the wound or on the skin anywhere on your body. MAKE SURE YOU:   Understand these instructions.  Will watch your condition.  Will get help right away if you are not doing well or get worse. Document Released: 11/17/2005 Document Revised: 02/09/2012 Document Reviewed: 05/13/2011 [facility] Patient Information 2015 [place], [place]. This information is not intended to replace advice given to you by your health care provider. Make sure you discuss any questions you have with your health care provider. Tissue Adhesive Wound Care Some cuts, wounds, lacerations, and incisions can be repaired by using tissue adhesive. Tissue adhesive is like glue. It holds the skin together, allowing for faster healing. It forms a strong bond on the skin in about 1 minute and reaches its full strength in about 2 or 3 minutes. The adhesive disappears naturally while the wound is healing. It is important to take proper care of your wound at home while it heals.  HOME CARE INSTRUCTIONS   Showers are allowed. Do not soak the area containing the tissue adhesive. Do not take baths, swim, or use hot tubs. Do not use any soaps or ointments on the wound. Certain ointments can weaken the glue.  If a bandage (dressing) has been applied, follow your health care provider's instructions for how often to change the dressing.   Keep the dressing dry if one has been  applied.   Do not scratch, pick, or rub the adhesive.   Do not place tape over the adhesive. The adhesive could come off when pulling the tape off.   Protect the wound from further injury until it is healed.   Protect the wound from sun and tanning bed exposure while it is healing and for several weeks after healing.   Only take over-the-counter or prescription medicines as directed by your health care provider.   Keep all follow-up appointments as directed by your health care provider. SEEK IMMEDIATE MEDICAL CARE IF:   Your wound becomes red, swollen, hot, or tender.   You develop a rash after the glue is applied.  You have increasing pain in the wound.   You have a red streak that goes away from the wound.   You have  pus coming from the wound.   You have increased bleeding.  You have a fever.  You have shaking chills.   You notice a bad smell coming from the wound.   Your wound or adhesive breaks open.  MAKE SURE YOU:   Understand these instructions.  Will watch your condition.  Will get help right away if you are not doing well or get worse. Document Released: 05/13/2001 Document Revised: 09/07/2013 Document Reviewed: 06/08/2013 [facility] Patient Information 2015 [place], [place]. This information is not intended to replace advice given to you by your health care provider. Make sure you discuss any questions you have with your health care provider.

## 2015-03-05 NOTE — ED Provider Notes (Signed)
CSN: [id number]     Arrival date & time 03/04/15  2247 History   First MD Initiated Contact with Patient 03/04/15 2308     Chief Complaint  Patient presents with  . Fall  . Skin Tears      HPI Patient reports she was walking out of the bathroom and then ended up on the floor P she is unsure if she passed out or whether or not she simply tripped.  Family reports that she has recurrent falls.  Patient presents with a skin tear to her right forearm and a skin tear with associated laceration to her right lower leg.  She denies head injury.  No neck pain.  She denies weakness of her arms or legs.  Patient was brought to the ER by EMS.  Patient's been in her normal state of health recently per her husband and family members.  Patient was seen earlier by the daughter and noted to be normal.  No reports of vomiting or diarrhea.  No recent fever or chills.  No other complaints.  Requests repair of her  skin tears and laceration.   Past Medical History  Diagnosis Date  . Hypertension   . S/P CABG x 3    Past Surgical History  Procedure Laterality Date  . Coronary stents x3     History reviewed. No pertinent family history. History  Substance Use Topics  . Smoking status: Never Smoker   . Smokeless tobacco: Never Used  . Alcohol Use: No   OB History    No data available     Review of Systems  All other systems reviewed and are negative.     Allergies  Review of patient's allergies indicates no known allergies.  Home Medications   Prior to Admission medications   Medication Sig Start Date End Date Taking? Authorizing Provider  bacitracin ointment Apply 1 application topically 2 (two) times daily. 03/05/15   Jola Schmidt, MD   BP 133/39 mmHg  Pulse 55  Temp(Src) 98.1 F (36.7 C) (Oral)  Resp 12  SpO2 98% Physical Exam  Constitutional: She is oriented to person, place, and time. She appears well-developed and well-nourished. No distress.  HENT:  Head: Normocephalic and  atraumatic.  Eyes: EOM are normal.  Neck: Normal range of motion.  No C-spine tenderness.  Cardiovascular: Normal rate, regular rhythm and normal heart sounds.   Pulmonary/Chest: Effort normal and breath sounds normal.  Abdominal: Soft. She exhibits no distension. There is no tenderness.  Musculoskeletal: Normal range of motion.  Superficial skin tear to her right posterior forearm without active bleeding.  Patient with a complicated skin tear with associated deep laceration involving the dermis and epidermis and subcutaneous fat of the right lower leg.  Full range of motion of the bilateral hips and bilateral knees and bilateral ankles.  Full range of motion of wrists, elbows, shoulders bilaterally.  Neurological: She is alert and oriented to person, place, and time.  Skin: Skin is warm and dry.  Psychiatric: She has a normal mood and affect. Judgment normal.  Nursing note and vitals reviewed.   ED Course  Procedures (including critical care time)  LACERATION REPAIR (COMPLICATED -LAYERED CLOSURE) Performed by: Hoy Morn Consent: Verbal consent obtained. Risks and benefits: risks, benefits and alternatives were discussed Patient identity confirmed: provided demographic data Time out performed prior to procedure Prepped and Draped in normal sterile fashion Wound explored Laceration Location: right lower lateral leg Laceration Length: 4cm No Foreign Bodies seen or palpated Anesthesia:  local infiltration Local anesthetic: lidocaine 2% with epinephrine Anesthetic total: 7 ml Irrigation method: syringe Amount of cleaning: standard Skin closure: 3-0 prolene and 4-0 vicryl Number of sutures or staples: 2 and  Technique: horizontal mattress and single interrupted deep Patient tolerance: Patient tolerated the procedure well with no immediate complications.  LACERATION REPAIR Performed by: Hoy Morn Consent: Verbal consent obtained. Risks and benefits: risks, benefits and  alternatives were discussed Patient identity confirmed: provided demographic data Time out performed prior to procedure Prepped and Draped in normal sterile fashion Wound explored Laceration Location: right forearm Laceration Length: 1.5cm No Foreign Bodies seen or palpated Anesthesia: none Amount of cleaning: standard Skin closure: tissue adhesive Number of sutures or staples: tissue adhesive Technique: tissue adhesive Patient tolerance: Patient tolerated the procedure well with no immediate complications.     Labs Review Labs Reviewed  I-STAT CHEM 8, ED - Abnormal; Notable for the following:    Glucose, Bld 156 (*)    Hemoglobin 11.2 (*)    HCT 33.0 (*)    All other components within normal limits    Imaging Review No results found.  ECG interpretation   Date: 03/05/2015  Rate: 58  Rhythm: normal sinus rhythm  QRS Axis: normal  Intervals: normal  ST/T Wave abnormalities: normal  Conduction Disutrbances: none  Narrative Interpretation:   Old EKG Reviewed: no prior ecg   MDM   Final diagnoses:  Fall, initial encounter  Skin tear of right forearm without complication, initial encounter  Laceration of right lower leg, initial encounter      Infection warnings given.  Head injury warnings given.  EKG and labs without significant abnormality.  Skin tear fixed with Dermabond.  Complicated laceration with layered closure.  Jola Schmidt, MD 03/05/15 [id number]

## 2015-03-05 NOTE — ED Notes (Signed)
EKG given to [facility] for review

## 2015-03-06 DIAGNOSIS — M6281 Muscle weakness (generalized): Secondary | ICD-10-CM | POA: Diagnosis not present

## 2015-03-06 DIAGNOSIS — R269 Unspecified abnormalities of gait and mobility: Secondary | ICD-10-CM | POA: Diagnosis not present

## 2015-03-06 DIAGNOSIS — R278 Other lack of coordination: Secondary | ICD-10-CM | POA: Diagnosis not present

## 2015-03-06 DIAGNOSIS — R279 Unspecified lack of coordination: Secondary | ICD-10-CM | POA: Diagnosis not present

## 2015-03-07 DIAGNOSIS — R269 Unspecified abnormalities of gait and mobility: Secondary | ICD-10-CM | POA: Diagnosis not present

## 2015-03-07 DIAGNOSIS — R278 Other lack of coordination: Secondary | ICD-10-CM | POA: Diagnosis not present

## 2015-03-07 DIAGNOSIS — R279 Unspecified lack of coordination: Secondary | ICD-10-CM | POA: Diagnosis not present

## 2015-03-07 DIAGNOSIS — M6281 Muscle weakness (generalized): Secondary | ICD-10-CM | POA: Diagnosis not present

## 2015-03-08 DIAGNOSIS — R279 Unspecified lack of coordination: Secondary | ICD-10-CM | POA: Diagnosis not present

## 2015-03-08 DIAGNOSIS — R278 Other lack of coordination: Secondary | ICD-10-CM | POA: Diagnosis not present

## 2015-03-08 DIAGNOSIS — M1612 Unilateral primary osteoarthritis, left hip: Secondary | ICD-10-CM | POA: Diagnosis not present

## 2015-03-08 DIAGNOSIS — R269 Unspecified abnormalities of gait and mobility: Secondary | ICD-10-CM | POA: Diagnosis not present

## 2015-03-08 DIAGNOSIS — M6281 Muscle weakness (generalized): Secondary | ICD-10-CM | POA: Diagnosis not present

## 2015-03-09 DIAGNOSIS — R278 Other lack of coordination: Secondary | ICD-10-CM | POA: Diagnosis not present

## 2015-03-09 DIAGNOSIS — M6281 Muscle weakness (generalized): Secondary | ICD-10-CM | POA: Diagnosis not present

## 2015-03-09 DIAGNOSIS — R269 Unspecified abnormalities of gait and mobility: Secondary | ICD-10-CM | POA: Diagnosis not present

## 2015-03-09 DIAGNOSIS — R279 Unspecified lack of coordination: Secondary | ICD-10-CM | POA: Diagnosis not present

## 2015-03-11 DIAGNOSIS — L0390 Cellulitis, unspecified: Secondary | ICD-10-CM | POA: Diagnosis not present

## 2015-03-13 DIAGNOSIS — M1712 Unilateral primary osteoarthritis, left knee: Secondary | ICD-10-CM | POA: Diagnosis not present

## 2015-03-13 DIAGNOSIS — S81811A Laceration without foreign body, right lower leg, initial encounter: Secondary | ICD-10-CM | POA: Diagnosis not present

## 2015-03-13 DIAGNOSIS — R279 Unspecified lack of coordination: Secondary | ICD-10-CM | POA: Diagnosis not present

## 2015-03-13 DIAGNOSIS — M6281 Muscle weakness (generalized): Secondary | ICD-10-CM | POA: Diagnosis not present

## 2015-03-13 DIAGNOSIS — R269 Unspecified abnormalities of gait and mobility: Secondary | ICD-10-CM | POA: Diagnosis not present

## 2015-03-13 DIAGNOSIS — M1612 Unilateral primary osteoarthritis, left hip: Secondary | ICD-10-CM | POA: Diagnosis not present

## 2015-03-13 DIAGNOSIS — R278 Other lack of coordination: Secondary | ICD-10-CM | POA: Diagnosis not present

## 2015-03-20 DIAGNOSIS — T148 Other injury of unspecified body region: Secondary | ICD-10-CM | POA: Diagnosis not present

## 2015-03-20 DIAGNOSIS — R279 Unspecified lack of coordination: Secondary | ICD-10-CM | POA: Diagnosis not present

## 2015-03-20 DIAGNOSIS — M6281 Muscle weakness (generalized): Secondary | ICD-10-CM | POA: Diagnosis not present

## 2015-03-20 DIAGNOSIS — R269 Unspecified abnormalities of gait and mobility: Secondary | ICD-10-CM | POA: Diagnosis not present

## 2015-03-20 DIAGNOSIS — R278 Other lack of coordination: Secondary | ICD-10-CM | POA: Diagnosis not present

## 2015-03-21 DIAGNOSIS — R278 Other lack of coordination: Secondary | ICD-10-CM | POA: Diagnosis not present

## 2015-03-21 DIAGNOSIS — R279 Unspecified lack of coordination: Secondary | ICD-10-CM | POA: Diagnosis not present

## 2015-03-21 DIAGNOSIS — R269 Unspecified abnormalities of gait and mobility: Secondary | ICD-10-CM | POA: Diagnosis not present

## 2015-03-21 DIAGNOSIS — M6281 Muscle weakness (generalized): Secondary | ICD-10-CM | POA: Diagnosis not present

## 2015-03-22 DIAGNOSIS — M6281 Muscle weakness (generalized): Secondary | ICD-10-CM | POA: Diagnosis not present

## 2015-03-22 DIAGNOSIS — R279 Unspecified lack of coordination: Secondary | ICD-10-CM | POA: Diagnosis not present

## 2015-03-22 DIAGNOSIS — R278 Other lack of coordination: Secondary | ICD-10-CM | POA: Diagnosis not present

## 2015-03-22 DIAGNOSIS — R269 Unspecified abnormalities of gait and mobility: Secondary | ICD-10-CM | POA: Diagnosis not present

## 2015-03-23 DIAGNOSIS — R269 Unspecified abnormalities of gait and mobility: Secondary | ICD-10-CM | POA: Diagnosis not present

## 2015-03-23 DIAGNOSIS — R279 Unspecified lack of coordination: Secondary | ICD-10-CM | POA: Diagnosis not present

## 2015-03-23 DIAGNOSIS — M6281 Muscle weakness (generalized): Secondary | ICD-10-CM | POA: Diagnosis not present

## 2015-03-23 DIAGNOSIS — R278 Other lack of coordination: Secondary | ICD-10-CM | POA: Diagnosis not present

## 2015-03-26 DIAGNOSIS — R279 Unspecified lack of coordination: Secondary | ICD-10-CM | POA: Diagnosis not present

## 2015-03-26 DIAGNOSIS — R269 Unspecified abnormalities of gait and mobility: Secondary | ICD-10-CM | POA: Diagnosis not present

## 2015-03-26 DIAGNOSIS — R278 Other lack of coordination: Secondary | ICD-10-CM | POA: Diagnosis not present

## 2015-03-26 DIAGNOSIS — M6281 Muscle weakness (generalized): Secondary | ICD-10-CM | POA: Diagnosis not present

## 2015-03-27 DIAGNOSIS — M6281 Muscle weakness (generalized): Secondary | ICD-10-CM | POA: Diagnosis not present

## 2015-03-27 DIAGNOSIS — R278 Other lack of coordination: Secondary | ICD-10-CM | POA: Diagnosis not present

## 2015-03-27 DIAGNOSIS — R269 Unspecified abnormalities of gait and mobility: Secondary | ICD-10-CM | POA: Diagnosis not present

## 2015-03-27 DIAGNOSIS — R279 Unspecified lack of coordination: Secondary | ICD-10-CM | POA: Diagnosis not present

## 2015-03-28 DIAGNOSIS — R279 Unspecified lack of coordination: Secondary | ICD-10-CM | POA: Diagnosis not present

## 2015-03-28 DIAGNOSIS — R269 Unspecified abnormalities of gait and mobility: Secondary | ICD-10-CM | POA: Diagnosis not present

## 2015-03-28 DIAGNOSIS — R278 Other lack of coordination: Secondary | ICD-10-CM | POA: Diagnosis not present

## 2015-03-28 DIAGNOSIS — M6281 Muscle weakness (generalized): Secondary | ICD-10-CM | POA: Diagnosis not present

## 2015-03-29 DIAGNOSIS — S81811D Laceration without foreign body, right lower leg, subsequent encounter: Secondary | ICD-10-CM | POA: Diagnosis not present

## 2015-03-29 DIAGNOSIS — R278 Other lack of coordination: Secondary | ICD-10-CM | POA: Diagnosis not present

## 2015-03-29 DIAGNOSIS — M545 Low back pain: Secondary | ICD-10-CM | POA: Diagnosis not present

## 2015-03-29 DIAGNOSIS — R279 Unspecified lack of coordination: Secondary | ICD-10-CM | POA: Diagnosis not present

## 2015-03-29 DIAGNOSIS — R269 Unspecified abnormalities of gait and mobility: Secondary | ICD-10-CM | POA: Diagnosis not present

## 2015-03-29 DIAGNOSIS — M6281 Muscle weakness (generalized): Secondary | ICD-10-CM | POA: Diagnosis not present

## 2015-03-30 DIAGNOSIS — R278 Other lack of coordination: Secondary | ICD-10-CM | POA: Diagnosis not present

## 2015-03-30 DIAGNOSIS — R279 Unspecified lack of coordination: Secondary | ICD-10-CM | POA: Diagnosis not present

## 2015-03-30 DIAGNOSIS — R269 Unspecified abnormalities of gait and mobility: Secondary | ICD-10-CM | POA: Diagnosis not present

## 2015-03-30 DIAGNOSIS — M6281 Muscle weakness (generalized): Secondary | ICD-10-CM | POA: Diagnosis not present

## 2015-04-11 DIAGNOSIS — D649 Anemia, unspecified: Secondary | ICD-10-CM | POA: Diagnosis not present

## 2015-04-11 DIAGNOSIS — M6281 Muscle weakness (generalized): Secondary | ICD-10-CM | POA: Diagnosis not present

## 2015-04-11 DIAGNOSIS — R269 Unspecified abnormalities of gait and mobility: Secondary | ICD-10-CM | POA: Diagnosis not present

## 2015-04-11 DIAGNOSIS — R278 Other lack of coordination: Secondary | ICD-10-CM | POA: Diagnosis not present

## 2015-04-11 DIAGNOSIS — I2510 Atherosclerotic heart disease of native coronary artery without angina pectoris: Secondary | ICD-10-CM | POA: Diagnosis not present

## 2015-04-11 DIAGNOSIS — E1142 Type 2 diabetes mellitus with diabetic polyneuropathy: Secondary | ICD-10-CM | POA: Diagnosis not present

## 2015-04-11 DIAGNOSIS — R279 Unspecified lack of coordination: Secondary | ICD-10-CM | POA: Diagnosis not present

## 2015-04-11 DIAGNOSIS — J309 Allergic rhinitis, unspecified: Secondary | ICD-10-CM | POA: Diagnosis not present

## 2015-04-11 DIAGNOSIS — R0600 Dyspnea, unspecified: Secondary | ICD-10-CM | POA: Diagnosis not present

## 2015-04-11 DIAGNOSIS — I10 Essential (primary) hypertension: Secondary | ICD-10-CM | POA: Diagnosis not present

## 2015-04-12 DIAGNOSIS — R278 Other lack of coordination: Secondary | ICD-10-CM | POA: Diagnosis not present

## 2015-04-12 DIAGNOSIS — S81811D Laceration without foreign body, right lower leg, subsequent encounter: Secondary | ICD-10-CM | POA: Diagnosis not present

## 2015-04-12 DIAGNOSIS — M6281 Muscle weakness (generalized): Secondary | ICD-10-CM | POA: Diagnosis not present

## 2015-04-12 DIAGNOSIS — R279 Unspecified lack of coordination: Secondary | ICD-10-CM | POA: Diagnosis not present

## 2015-04-12 DIAGNOSIS — R269 Unspecified abnormalities of gait and mobility: Secondary | ICD-10-CM | POA: Diagnosis not present

## 2015-04-13 DIAGNOSIS — R269 Unspecified abnormalities of gait and mobility: Secondary | ICD-10-CM | POA: Diagnosis not present

## 2015-04-13 DIAGNOSIS — R279 Unspecified lack of coordination: Secondary | ICD-10-CM | POA: Diagnosis not present

## 2015-04-13 DIAGNOSIS — R278 Other lack of coordination: Secondary | ICD-10-CM | POA: Diagnosis not present

## 2015-04-13 DIAGNOSIS — M6281 Muscle weakness (generalized): Secondary | ICD-10-CM | POA: Diagnosis not present

## 2015-04-16 DIAGNOSIS — R278 Other lack of coordination: Secondary | ICD-10-CM | POA: Diagnosis not present

## 2015-04-16 DIAGNOSIS — M6281 Muscle weakness (generalized): Secondary | ICD-10-CM | POA: Diagnosis not present

## 2015-04-16 DIAGNOSIS — R279 Unspecified lack of coordination: Secondary | ICD-10-CM | POA: Diagnosis not present

## 2015-04-16 DIAGNOSIS — R269 Unspecified abnormalities of gait and mobility: Secondary | ICD-10-CM | POA: Diagnosis not present

## 2015-04-17 DIAGNOSIS — M6281 Muscle weakness (generalized): Secondary | ICD-10-CM | POA: Diagnosis not present

## 2015-04-17 DIAGNOSIS — R278 Other lack of coordination: Secondary | ICD-10-CM | POA: Diagnosis not present

## 2015-04-17 DIAGNOSIS — R279 Unspecified lack of coordination: Secondary | ICD-10-CM | POA: Diagnosis not present

## 2015-04-17 DIAGNOSIS — R269 Unspecified abnormalities of gait and mobility: Secondary | ICD-10-CM | POA: Diagnosis not present

## 2015-04-18 DIAGNOSIS — M6281 Muscle weakness (generalized): Secondary | ICD-10-CM | POA: Diagnosis not present

## 2015-04-18 DIAGNOSIS — R279 Unspecified lack of coordination: Secondary | ICD-10-CM | POA: Diagnosis not present

## 2015-04-18 DIAGNOSIS — R269 Unspecified abnormalities of gait and mobility: Secondary | ICD-10-CM | POA: Diagnosis not present

## 2015-04-18 DIAGNOSIS — R278 Other lack of coordination: Secondary | ICD-10-CM | POA: Diagnosis not present

## 2015-04-19 DIAGNOSIS — M6281 Muscle weakness (generalized): Secondary | ICD-10-CM | POA: Diagnosis not present

## 2015-04-19 DIAGNOSIS — R278 Other lack of coordination: Secondary | ICD-10-CM | POA: Diagnosis not present

## 2015-04-19 DIAGNOSIS — S81811D Laceration without foreign body, right lower leg, subsequent encounter: Secondary | ICD-10-CM | POA: Diagnosis not present

## 2015-04-19 DIAGNOSIS — R269 Unspecified abnormalities of gait and mobility: Secondary | ICD-10-CM | POA: Diagnosis not present

## 2015-04-19 DIAGNOSIS — R279 Unspecified lack of coordination: Secondary | ICD-10-CM | POA: Diagnosis not present

## 2015-04-20 DIAGNOSIS — M479 Spondylosis, unspecified: Secondary | ICD-10-CM | POA: Diagnosis not present

## 2015-04-20 DIAGNOSIS — M170 Bilateral primary osteoarthritis of knee: Secondary | ICD-10-CM | POA: Diagnosis not present

## 2015-04-20 DIAGNOSIS — R278 Other lack of coordination: Secondary | ICD-10-CM | POA: Diagnosis not present

## 2015-04-20 DIAGNOSIS — E1140 Type 2 diabetes mellitus with diabetic neuropathy, unspecified: Secondary | ICD-10-CM | POA: Diagnosis not present

## 2015-04-20 DIAGNOSIS — M6281 Muscle weakness (generalized): Secondary | ICD-10-CM | POA: Diagnosis not present

## 2015-04-20 DIAGNOSIS — M129 Arthropathy, unspecified: Secondary | ICD-10-CM | POA: Diagnosis not present

## 2015-04-20 DIAGNOSIS — M1612 Unilateral primary osteoarthritis, left hip: Secondary | ICD-10-CM | POA: Diagnosis not present

## 2015-04-20 DIAGNOSIS — R269 Unspecified abnormalities of gait and mobility: Secondary | ICD-10-CM | POA: Diagnosis not present

## 2015-04-20 DIAGNOSIS — R279 Unspecified lack of coordination: Secondary | ICD-10-CM | POA: Diagnosis not present

## 2015-04-23 DIAGNOSIS — R278 Other lack of coordination: Secondary | ICD-10-CM | POA: Diagnosis not present

## 2015-04-23 DIAGNOSIS — R269 Unspecified abnormalities of gait and mobility: Secondary | ICD-10-CM | POA: Diagnosis not present

## 2015-04-23 DIAGNOSIS — R279 Unspecified lack of coordination: Secondary | ICD-10-CM | POA: Diagnosis not present

## 2015-04-23 DIAGNOSIS — M6281 Muscle weakness (generalized): Secondary | ICD-10-CM | POA: Diagnosis not present

## 2015-04-24 DIAGNOSIS — R279 Unspecified lack of coordination: Secondary | ICD-10-CM | POA: Diagnosis not present

## 2015-04-24 DIAGNOSIS — R278 Other lack of coordination: Secondary | ICD-10-CM | POA: Diagnosis not present

## 2015-04-24 DIAGNOSIS — M6281 Muscle weakness (generalized): Secondary | ICD-10-CM | POA: Diagnosis not present

## 2015-04-24 DIAGNOSIS — R269 Unspecified abnormalities of gait and mobility: Secondary | ICD-10-CM | POA: Diagnosis not present

## 2015-04-25 DIAGNOSIS — R269 Unspecified abnormalities of gait and mobility: Secondary | ICD-10-CM | POA: Diagnosis not present

## 2015-04-25 DIAGNOSIS — M6281 Muscle weakness (generalized): Secondary | ICD-10-CM | POA: Diagnosis not present

## 2015-04-25 DIAGNOSIS — R278 Other lack of coordination: Secondary | ICD-10-CM | POA: Diagnosis not present

## 2015-04-25 DIAGNOSIS — R279 Unspecified lack of coordination: Secondary | ICD-10-CM | POA: Diagnosis not present

## 2015-04-26 DIAGNOSIS — R278 Other lack of coordination: Secondary | ICD-10-CM | POA: Diagnosis not present

## 2015-04-26 DIAGNOSIS — R269 Unspecified abnormalities of gait and mobility: Secondary | ICD-10-CM | POA: Diagnosis not present

## 2015-04-26 DIAGNOSIS — M6281 Muscle weakness (generalized): Secondary | ICD-10-CM | POA: Diagnosis not present

## 2015-04-26 DIAGNOSIS — R279 Unspecified lack of coordination: Secondary | ICD-10-CM | POA: Diagnosis not present

## 2015-05-01 DIAGNOSIS — R269 Unspecified abnormalities of gait and mobility: Secondary | ICD-10-CM | POA: Diagnosis not present

## 2015-05-01 DIAGNOSIS — R279 Unspecified lack of coordination: Secondary | ICD-10-CM | POA: Diagnosis not present

## 2015-05-01 DIAGNOSIS — M6281 Muscle weakness (generalized): Secondary | ICD-10-CM | POA: Diagnosis not present

## 2015-05-01 DIAGNOSIS — R278 Other lack of coordination: Secondary | ICD-10-CM | POA: Diagnosis not present

## 2015-05-02 ENCOUNTER — Ambulatory Visit (INDEPENDENT_AMBULATORY_CARE_PROVIDER_SITE_OTHER): Payer: Medicare Other | Admitting: Cardiology

## 2015-05-02 ENCOUNTER — Encounter: Payer: Self-pay | Admitting: Cardiology

## 2015-05-02 VITALS — BP 144/50 | HR 63 | Ht 61.0 in | Wt 140.0 lb

## 2015-05-02 DIAGNOSIS — I779 Disorder of arteries and arterioles, unspecified: Secondary | ICD-10-CM | POA: Diagnosis not present

## 2015-05-02 DIAGNOSIS — R42 Dizziness and giddiness: Secondary | ICD-10-CM | POA: Diagnosis not present

## 2015-05-02 DIAGNOSIS — I2510 Atherosclerotic heart disease of native coronary artery without angina pectoris: Secondary | ICD-10-CM

## 2015-05-02 DIAGNOSIS — R279 Unspecified lack of coordination: Secondary | ICD-10-CM | POA: Diagnosis not present

## 2015-05-02 DIAGNOSIS — I2583 Coronary atherosclerosis due to lipid rich plaque: Secondary | ICD-10-CM

## 2015-05-02 DIAGNOSIS — R278 Other lack of coordination: Secondary | ICD-10-CM | POA: Diagnosis not present

## 2015-05-02 DIAGNOSIS — R269 Unspecified abnormalities of gait and mobility: Secondary | ICD-10-CM | POA: Diagnosis not present

## 2015-05-02 DIAGNOSIS — I739 Peripheral vascular disease, unspecified: Secondary | ICD-10-CM

## 2015-05-02 DIAGNOSIS — M6281 Muscle weakness (generalized): Secondary | ICD-10-CM | POA: Diagnosis not present

## 2015-05-02 NOTE — Progress Notes (Signed)
Cardiology Office Note   Date:  [id number]   ID:  Jessica King, DOB [id number], MRN [id number]  PCP:  Jessica Argyle, MD  Cardiologist:   Jessica Breeding, MD   Chief Complaint  Patient presents with  . Coronary Artery Disease      History of Present Illness: Jessica King is a 81 y.o. female who presents for evaluation of known coronary disease. She has moved from [place] where she had bypass surgery and she reports 3 stents. I don't have any of these records. She apparently also has had a subclavian stent. Many years ago she had carotid endarterectomy.  She is being considered for possible orthopedic surgery. It's interesting she has been told that she will need this when she falls and she says they expect her to fall again. She has frequent falls and uses a walker. She does get some chest discomfort. This is in her mid chest. It is sporadic. It is sharp. It comes and goes spontaneously. It lasts for a very brief time. She does not have associated symptoms such as nausea vomiting or diaphoresis. She has chronic dyspnea. She gets around slowly with orthopedic problems and uses a walker. She does not describe resting shortness of breath, PND or orthopnea. She doesn't have palpitations, presyncope or syncope.   Past Medical History  Diagnosis Date  . Hypertension   . Diabetes mellitus without complication   . Coronary artery disease   . Hyperlipidemia   . Iron deficiency anemia   . Diverticulosis   . Peripheral neuropathy     Frequent fall  . Dyspnea   . Allergic rhinitis     Past Surgical History  Procedure Laterality Date  . Coronary stents x3    . Back surgery      x 3 or 4 lumbar, cervical x 2  . Coronary artery bypass graft  2011    x 3 (records pending)  . Cea       Current Outpatient Prescriptions  Medication Sig Dispense Refill  . Calcium Carbonate (CALCIUM 600 PO) Take 600 mg by mouth 2 (two) times daily.    . carvedilol (COREG) 3.125 MG tablet Take 3.125  mg by mouth 2 (two) times daily with a meal.    . clonazePAM (KLONOPIN) 0.5 MG tablet Take 0.5 mg by mouth 2 (two) times daily as needed for anxiety.    . clopidogrel (PLAVIX) 75 MG tablet Take 75 mg by mouth daily.    . ferrous sulfate 325 (65 FE) MG tablet Take 325 mg by mouth daily with breakfast.    . gabapentin (NEURONTIN) 300 MG capsule Take 300 mg by mouth 3 (three) times daily.    Marland Kitchen HYDROcodone-acetaminophen (NORCO) 7.5-325 MG per tablet Take 1 tablet by mouth every 6 (six) hours as needed for moderate pain.    Marland Kitchen lisinopril (PRINIVIL,ZESTRIL) 2.5 MG tablet Take 2.5 mg by mouth daily.    . metFORMIN (GLUCOPHAGE) 1000 MG tablet Take 1,000 mg by mouth 2 (two) times daily with a meal.    . methocarbamol (ROBAXIN) 750 MG tablet Take 750 mg by mouth 2 (two) times daily.    Marland Kitchen PARoxetine (PAXIL) 20 MG tablet Take 20 mg by mouth daily.    . simvastatin (ZOCOR) 40 MG tablet Take 40 mg by mouth daily.    Marland Kitchen zolpidem (AMBIEN) 5 MG tablet Take 10 mg by mouth at bedtime.     No current facility-administered medications for this visit.    Allergies:   Sulfa  antibiotics    Social History:  The patient  reports that she has never smoked. She has never used smokeless tobacco. She reports that she does not drink alcohol.   Family History:  The patient's family history includes Diabetes in her father; Heart attack (age of onset: 84) in her brother; Heart disease in her mother; Hypertension in her mother.    ROS:  Please see the history of present illness.   Otherwise, review of systems are positive for reflux, back pain, joint pains..   All other systems are reviewed and negative.    PHYSICAL EXAM: VS:  BP 144/50 mmHg  Pulse 63  Ht 5\' 1"  (1.549 m)  Wt 140 lb (63.504 kg)  BMI 26.47 kg/m2 , BMI Body mass index is 26.47 kg/(m^2). GENERAL:  Well appearing, but somewhat frail HEENT:  Pupils equal round and reactive, fundi not visualized, oral mucosa unremarkable NECK:  No jugular venous distention,  waveform within normal limits, carotid upstroke brisk and symmetric, bilateral bruits, no thyromegaly LYMPHATICS:  No cervical, inguinal adenopathy LUNGS:  Clear to auscultation bilaterally BACK:  No CVA tenderness CHEST:  Unremarkable HEART:  PMI not displaced or sustained,S1 and S2 within normal limits, no S3, no S4, no clicks, no rubs, no murmurs ABD:  Flat, positive bowel sounds normal in frequency in pitch, no bruits, no rebound, no guarding, no midline pulsatile mass, no hepatomegaly, no splenomegaly EXT:  2 plus pulses throughout, no edema, no cyanosis no clubbing, right leg swelling with healing anterior tibial wound.  SKIN:  No rashes no nodules NEURO:  Cranial nerves II through XII grossly intact, motor grossly intact throughout PSYCH:  Cognitively intact, oriented to person place and time    EKG:  EKG is notordered today. The ekg ordered 4/4 demonstrates sinus rhythm, rate 58, axis within normal limits, intervals within normal limits, no acute ST-T wave changes.   Recent Labs: 03/05/2015: BUN 23; Creatinine 1.00; Hemoglobin 11.2*; Potassium 4.5; Sodium 136    Lipid Panel No results found for: CHOL, TRIG, HDL, CHOLHDL, VLDL, LDLCALC, LDLDIRECT    Wt Readings from Last 3 Encounters:  05/02/15 140 lb (63.504 kg)      Other studies Reviewed: Additional studies/ records that were reviewed today include: Office records from Jessica Argyle, MD. Review of the above records demonstrates:  Please see elsewhere in the note.     ASSESSMENT AND PLAN:  CAD:  I will need to get her records from [place]. She's not had a recent stress test and she does have some pain though atypical. She will need stress testing but would not be a walk on a treadmill. Therefore, she will have a [employer].  BRUIT:  She has known carotid disease and bilateral bruits. I will get a carotid Doppler.  HTN:  Her blood pressure is borderline. I will not changes to her meds that she can keep an  eye on this at home.  DM:  I will defer to Jessica Argyle, MD   DYSLIPIDEMIA:  Given her significant vascular history I will suggest a goal LDL less than 70 and I will again defer to Jessica Argyle, MD   Current medicines are reviewed at length with the patient today.  The patient does not have concerns regarding medicines.  The following changes have been made:  no change  Labs/ tests ordered today include:   Orders Placed This Encounter  Procedures  . Myocardial Perfusion Imaging     Disposition:   FU with me in six  months.     Signed, Jessica Breeding, MD  05/02/2015 4:01 PM    [facility]

## 2015-05-02 NOTE — Patient Instructions (Signed)
Your physician recommends that you schedule a follow-up appointment in: 6 months with Dr. Percival Spanish  We are ordering a stress test and a doppler for you to get done

## 2015-05-03 DIAGNOSIS — R279 Unspecified lack of coordination: Secondary | ICD-10-CM | POA: Diagnosis not present

## 2015-05-03 DIAGNOSIS — M6281 Muscle weakness (generalized): Secondary | ICD-10-CM | POA: Diagnosis not present

## 2015-05-03 DIAGNOSIS — R269 Unspecified abnormalities of gait and mobility: Secondary | ICD-10-CM | POA: Diagnosis not present

## 2015-05-03 DIAGNOSIS — R278 Other lack of coordination: Secondary | ICD-10-CM | POA: Diagnosis not present

## 2015-05-07 DIAGNOSIS — R278 Other lack of coordination: Secondary | ICD-10-CM | POA: Diagnosis not present

## 2015-05-07 DIAGNOSIS — R269 Unspecified abnormalities of gait and mobility: Secondary | ICD-10-CM | POA: Diagnosis not present

## 2015-05-07 DIAGNOSIS — M6281 Muscle weakness (generalized): Secondary | ICD-10-CM | POA: Diagnosis not present

## 2015-05-07 DIAGNOSIS — R279 Unspecified lack of coordination: Secondary | ICD-10-CM | POA: Diagnosis not present

## 2015-05-08 ENCOUNTER — Telehealth: Payer: Self-pay | Admitting: Cardiology

## 2015-05-08 NOTE — Telephone Encounter (Signed)
Patient requesting records be obtained from [facility] as she in this area now.  She gave information as to where to fax.  Faxed her signed release on 05/08/15. lp

## 2015-05-09 DIAGNOSIS — R279 Unspecified lack of coordination: Secondary | ICD-10-CM | POA: Diagnosis not present

## 2015-05-09 DIAGNOSIS — R269 Unspecified abnormalities of gait and mobility: Secondary | ICD-10-CM | POA: Diagnosis not present

## 2015-05-09 DIAGNOSIS — M6281 Muscle weakness (generalized): Secondary | ICD-10-CM | POA: Diagnosis not present

## 2015-05-09 DIAGNOSIS — R278 Other lack of coordination: Secondary | ICD-10-CM | POA: Diagnosis not present

## 2015-05-10 DIAGNOSIS — R278 Other lack of coordination: Secondary | ICD-10-CM | POA: Diagnosis not present

## 2015-05-10 DIAGNOSIS — R279 Unspecified lack of coordination: Secondary | ICD-10-CM | POA: Diagnosis not present

## 2015-05-10 DIAGNOSIS — R269 Unspecified abnormalities of gait and mobility: Secondary | ICD-10-CM | POA: Diagnosis not present

## 2015-05-10 DIAGNOSIS — M6281 Muscle weakness (generalized): Secondary | ICD-10-CM | POA: Diagnosis not present

## 2015-05-11 DIAGNOSIS — R279 Unspecified lack of coordination: Secondary | ICD-10-CM | POA: Diagnosis not present

## 2015-05-11 DIAGNOSIS — M6281 Muscle weakness (generalized): Secondary | ICD-10-CM | POA: Diagnosis not present

## 2015-05-11 DIAGNOSIS — R269 Unspecified abnormalities of gait and mobility: Secondary | ICD-10-CM | POA: Diagnosis not present

## 2015-05-11 DIAGNOSIS — R278 Other lack of coordination: Secondary | ICD-10-CM | POA: Diagnosis not present

## 2015-05-14 DIAGNOSIS — R279 Unspecified lack of coordination: Secondary | ICD-10-CM | POA: Diagnosis not present

## 2015-05-14 DIAGNOSIS — M6281 Muscle weakness (generalized): Secondary | ICD-10-CM | POA: Diagnosis not present

## 2015-05-14 DIAGNOSIS — R269 Unspecified abnormalities of gait and mobility: Secondary | ICD-10-CM | POA: Diagnosis not present

## 2015-05-14 DIAGNOSIS — R278 Other lack of coordination: Secondary | ICD-10-CM | POA: Diagnosis not present

## 2015-05-15 DIAGNOSIS — R269 Unspecified abnormalities of gait and mobility: Secondary | ICD-10-CM | POA: Diagnosis not present

## 2015-05-15 DIAGNOSIS — R279 Unspecified lack of coordination: Secondary | ICD-10-CM | POA: Diagnosis not present

## 2015-05-15 DIAGNOSIS — M6281 Muscle weakness (generalized): Secondary | ICD-10-CM | POA: Diagnosis not present

## 2015-05-15 DIAGNOSIS — R278 Other lack of coordination: Secondary | ICD-10-CM | POA: Diagnosis not present

## 2015-05-16 DIAGNOSIS — R278 Other lack of coordination: Secondary | ICD-10-CM | POA: Diagnosis not present

## 2015-05-16 DIAGNOSIS — M6281 Muscle weakness (generalized): Secondary | ICD-10-CM | POA: Diagnosis not present

## 2015-05-16 DIAGNOSIS — R279 Unspecified lack of coordination: Secondary | ICD-10-CM | POA: Diagnosis not present

## 2015-05-16 DIAGNOSIS — R269 Unspecified abnormalities of gait and mobility: Secondary | ICD-10-CM | POA: Diagnosis not present

## 2015-05-17 DIAGNOSIS — R278 Other lack of coordination: Secondary | ICD-10-CM | POA: Diagnosis not present

## 2015-05-17 DIAGNOSIS — R279 Unspecified lack of coordination: Secondary | ICD-10-CM | POA: Diagnosis not present

## 2015-05-17 DIAGNOSIS — M6281 Muscle weakness (generalized): Secondary | ICD-10-CM | POA: Diagnosis not present

## 2015-05-17 DIAGNOSIS — R269 Unspecified abnormalities of gait and mobility: Secondary | ICD-10-CM | POA: Diagnosis not present

## 2015-05-18 DIAGNOSIS — R269 Unspecified abnormalities of gait and mobility: Secondary | ICD-10-CM | POA: Diagnosis not present

## 2015-05-18 DIAGNOSIS — R279 Unspecified lack of coordination: Secondary | ICD-10-CM | POA: Diagnosis not present

## 2015-05-18 DIAGNOSIS — M6281 Muscle weakness (generalized): Secondary | ICD-10-CM | POA: Diagnosis not present

## 2015-05-18 DIAGNOSIS — R278 Other lack of coordination: Secondary | ICD-10-CM | POA: Diagnosis not present

## 2015-05-21 DIAGNOSIS — R279 Unspecified lack of coordination: Secondary | ICD-10-CM | POA: Diagnosis not present

## 2015-05-21 DIAGNOSIS — M6281 Muscle weakness (generalized): Secondary | ICD-10-CM | POA: Diagnosis not present

## 2015-05-21 DIAGNOSIS — R278 Other lack of coordination: Secondary | ICD-10-CM | POA: Diagnosis not present

## 2015-05-21 DIAGNOSIS — R269 Unspecified abnormalities of gait and mobility: Secondary | ICD-10-CM | POA: Diagnosis not present

## 2015-05-22 ENCOUNTER — Telehealth (HOSPITAL_COMMUNITY): Payer: Self-pay

## 2015-05-22 DIAGNOSIS — M6281 Muscle weakness (generalized): Secondary | ICD-10-CM | POA: Diagnosis not present

## 2015-05-22 DIAGNOSIS — R269 Unspecified abnormalities of gait and mobility: Secondary | ICD-10-CM | POA: Diagnosis not present

## 2015-05-22 DIAGNOSIS — R278 Other lack of coordination: Secondary | ICD-10-CM | POA: Diagnosis not present

## 2015-05-22 DIAGNOSIS — R279 Unspecified lack of coordination: Secondary | ICD-10-CM | POA: Diagnosis not present

## 2015-05-22 NOTE — Telephone Encounter (Signed)
Encounter complete. 

## 2015-05-23 DIAGNOSIS — R278 Other lack of coordination: Secondary | ICD-10-CM | POA: Diagnosis not present

## 2015-05-23 DIAGNOSIS — M6281 Muscle weakness (generalized): Secondary | ICD-10-CM | POA: Diagnosis not present

## 2015-05-23 DIAGNOSIS — R279 Unspecified lack of coordination: Secondary | ICD-10-CM | POA: Diagnosis not present

## 2015-05-23 DIAGNOSIS — R269 Unspecified abnormalities of gait and mobility: Secondary | ICD-10-CM | POA: Diagnosis not present

## 2015-05-24 ENCOUNTER — Ambulatory Visit (HOSPITAL_BASED_OUTPATIENT_CLINIC_OR_DEPARTMENT_OTHER)
Admission: RE | Admit: 2015-05-24 | Discharge: 2015-05-24 | Disposition: A | Discharge status: Home / Self Care | Payer: Medicare Other | Source: Ambulatory Visit | Attending: Cardiology | Admitting: Cardiology

## 2015-05-24 ENCOUNTER — Ambulatory Visit (HOSPITAL_COMMUNITY)
Admission: RE | Admit: 2015-05-24 | Discharge: 2015-05-24 | Disposition: A | Discharge status: Home / Self Care | Payer: Medicare Other | Source: Ambulatory Visit | Attending: Cardiology | Admitting: Cardiology

## 2015-05-24 DIAGNOSIS — R42 Dizziness and giddiness: Secondary | ICD-10-CM

## 2015-05-24 DIAGNOSIS — I779 Disorder of arteries and arterioles, unspecified: Secondary | ICD-10-CM | POA: Insufficient documentation

## 2015-05-24 DIAGNOSIS — I2510 Atherosclerotic heart disease of native coronary artery without angina pectoris: Secondary | ICD-10-CM

## 2015-05-24 DIAGNOSIS — I739 Peripheral vascular disease, unspecified: Principal | ICD-10-CM

## 2015-05-24 DIAGNOSIS — I2583 Coronary atherosclerosis due to lipid rich plaque: Secondary | ICD-10-CM

## 2015-05-24 LAB — MYOCARDIAL PERFUSION IMAGING
CHL CUP NUCLEAR SDS: 0
CHL CUP NUCLEAR SSS: 3
LV sys vol: 43 mL
LVDIAVOL: 99 mL
Peak HR: 64 {beats}/min
Rest HR: 50 {beats}/min
SRS: 3
TID: 1.01

## 2015-05-24 MED ORDER — REGADENOSON 0.4 MG/5ML IV SOLN
0.4000 mg | Freq: Once | INTRAVENOUS | Status: AC
  Administered 2015-05-24: 0.4 mg via INTRAVENOUS

## 2015-05-24 MED ORDER — TECHNETIUM TC 99M SESTAMIBI GENERIC - CARDIOLITE
10.4000 | Freq: Once | INTRAVENOUS | Status: AC | PRN
  Administered 2015-05-24: 10 via INTRAVENOUS

## 2015-05-24 MED ORDER — AMINOPHYLLINE 25 MG/ML IV SOLN
75.0000 mg | Freq: Once | INTRAVENOUS | Status: AC
  Administered 2015-05-24: 75 mg via INTRAVENOUS

## 2015-05-24 MED ORDER — TECHNETIUM TC 99M SESTAMIBI GENERIC - CARDIOLITE
31.6000 | Freq: Once | INTRAVENOUS | Status: AC | PRN
  Administered 2015-05-24: 32 via INTRAVENOUS

## 2015-05-25 ENCOUNTER — Telehealth: Payer: Self-pay | Admitting: Cardiology

## 2015-05-25 DIAGNOSIS — R279 Unspecified lack of coordination: Secondary | ICD-10-CM | POA: Diagnosis not present

## 2015-05-25 DIAGNOSIS — R278 Other lack of coordination: Secondary | ICD-10-CM | POA: Diagnosis not present

## 2015-05-25 DIAGNOSIS — R269 Unspecified abnormalities of gait and mobility: Secondary | ICD-10-CM | POA: Diagnosis not present

## 2015-05-25 DIAGNOSIS — M6281 Muscle weakness (generalized): Secondary | ICD-10-CM | POA: Diagnosis not present

## 2015-05-25 NOTE — Telephone Encounter (Signed)
Closed encounter °

## 2015-05-30 ENCOUNTER — Telehealth: Payer: Self-pay | Admitting: Cardiology

## 2015-05-30 NOTE — Telephone Encounter (Signed)
Received records from [facility] for appointment with Dr Percival Spanish on 05/31/15.  Records given to [facility] (medical records) for Dr Hochrein's schedule on 05/31/15. lp

## 2015-05-31 ENCOUNTER — Ambulatory Visit (INDEPENDENT_AMBULATORY_CARE_PROVIDER_SITE_OTHER): Payer: Medicare Other | Admitting: Cardiology

## 2015-05-31 ENCOUNTER — Encounter: Payer: Self-pay | Admitting: Cardiology

## 2015-05-31 VITALS — BP 156/54 | HR 61 | Ht 61.0 in | Wt 142.0 lb

## 2015-05-31 DIAGNOSIS — I2510 Atherosclerotic heart disease of native coronary artery without angina pectoris: Secondary | ICD-10-CM | POA: Diagnosis not present

## 2015-05-31 NOTE — Patient Instructions (Signed)
Your physician wants you to follow-up in: 1 Year. You will receive a reminder letter in the mail two months in advance. If you don't receive a letter, please call our office to schedule the follow-up appointment.  

## 2015-05-31 NOTE — Progress Notes (Signed)
Cardiology Office Note   Date:  0000000   ID:  Jessica King, DOB [id number], MRN [id number]  PCP:  Mathews Argyle, MD  Cardiologist:   Minus Breeding, MD   Chief Complaint  Patient presents with  . Coronary Artery Disease      History of Present Illness: Jessica King is a 81 y.o. female who presents for evaluation of known coronary disease. She has moved from [place].  Since my first appointment with her I have received her medical records. Her history of coronary disease is reported below. She's had stenting and bypass surgery. Her last stress test appears to be 2013.   I saw her as a new patient recently and saw her preoperatively given her extensive vascular history. I did send her for a stress perfusion study which was low risk with no evidence of ischemia.  I did not receive a record of her bypass anatomy however.  She has not committed to surgery yet but returned for follow up after her stress test and carotid Dopplers. She uses a walker. She does get some chest discomfort as previously described but this is rare and unchanged.  She has chronic dyspnea. She gets around slowly with orthopedic problems . She does not describe resting shortness of breath, PND or orthopnea. She doesn't have palpitations, presyncope or syncope.   Past Medical History  Diagnosis Date  . Hypertension   . Diabetes mellitus without complication   . Coronary artery disease     Cardiac cath 1999, 2007, 2010, Taxus stent x 2 to LAD 2007.   Marland Kitchen Hyperlipidemia   . Iron deficiency anemia   . Diverticulosis   . Peripheral neuropathy     Frequent fall  . Dyspnea   . Allergic rhinitis   . PVD (peripheral vascular disease)     Subclavian stent May 2013 (subclavian steal)    Past Surgical History  Procedure Laterality Date  . Coronary stents x3    . Back surgery      x 3 or 4 lumbar, cervical x 2  . Coronary artery bypass graft  2011    x 3   . Cea       Current Outpatient Prescriptions   Medication Sig Dispense Refill  . carvedilol (COREG) 3.125 MG tablet Take 3.125 mg by mouth 2 (two) times daily with a meal.    . clonazePAM (KLONOPIN) 0.5 MG tablet Take 0.5 mg by mouth 2 (two) times daily as needed for anxiety.    . clopidogrel (PLAVIX) 75 MG tablet Take 75 mg by mouth daily.    . ferrous sulfate 325 (65 FE) MG tablet Take 325 mg by mouth daily with breakfast.    . gabapentin (NEURONTIN) 300 MG capsule Take 300 mg by mouth 3 (three) times daily.    Marland Kitchen HYDROcodone-acetaminophen (NORCO) 7.5-325 MG per tablet Take 1 tablet by mouth every 6 (six) hours as needed for moderate pain.    Marland Kitchen lisinopril (PRINIVIL,ZESTRIL) 2.5 MG tablet Take 2.5 mg by mouth daily.    . metFORMIN (GLUCOPHAGE) 1000 MG tablet Take 1,000 mg by mouth 2 (two) times daily with a meal.    . methocarbamol (ROBAXIN) 750 MG tablet Take 750 mg by mouth 2 (two) times daily.    Marland Kitchen PARoxetine (PAXIL) 20 MG tablet Take 20 mg by mouth daily.    . simvastatin (ZOCOR) 40 MG tablet Take 40 mg by mouth daily.    Marland Kitchen zolpidem (AMBIEN) 5 MG tablet Take 10 mg by mouth  at bedtime.     No current facility-administered medications for this visit.    Allergies:   Sulfa antibiotics    ROS:  Please see the history of present illness.   Otherwise, review of systems are positive for back pain, joint pains..   All other systems are reviewed and negative.    PHYSICAL EXAM: VS:  BP 156/54 mmHg  Pulse 61  Ht 5\' 1"  (1.549 m)  Wt 142 lb (64.411 kg)  BMI 26.84 kg/m2 , BMI Body mass index is 26.84 kg/(m^2). GENERAL:  Well appearing, but somewhat frail HEENT:  Pupils equal round and reactive, fundi not visualized, oral mucosa unremarkable NECK:  No jugular venous distention, waveform within normal limits, carotid upstroke brisk and symmetric, bilateral bruits, no thyromegaly LUNGS:  Clear to auscultation bilaterally CHEST:  Unremarkable HEART:  PMI not displaced or sustained,S1 and S2 within normal limits, no S3, no S4, no clicks, no  rubs, no murmurs ABD:  Flat, positive bowel sounds normal in frequency in pitch, no bruits, no rebound, no guarding, no midline pulsatile mass, no hepatomegaly, no splenomegaly EXT:  2 plus pulses throughout, no edema, no cyanosis no clubbing, right leg swelling with healing anterior tibial wound.     EKG:  EKG is not ordered today.    Recent Labs: 03/05/2015: BUN 23; Creatinine, Ser 1.00; Hemoglobin 11.2*; Potassium 4.5; Sodium 136    Lipid Panel No results found for: CHOL, TRIG, HDL, CHOLHDL, VLDL, LDLCALC, LDLDIRECT    Wt Readings from Last 3 Encounters:  05/31/15 142 lb (64.411 kg)  05/24/15 140 lb (63.504 kg)  05/02/15 140 lb (63.504 kg)      Other studies Reviewed: Additional studies/ records that were reviewed today include: Office records from [place] of the above records demonstrates:  Please see elsewhere in the note.     ASSESSMENT AND PLAN:  CAD:   She had no ischemia and the EF was normal.  No further testing is indicated.  Of note, if she decides to have orthopedic surgery, based on ACC/AHA guidelines, the patient would be at acceptable risk for the planned procedure without further cardiovascular testing if she has this in the next six to twelve months.  BRUIT:  She had mild plaque on Doppler.  I will follow again in one year.   HTN:  Her blood pressure is borderline. I will not changes to her meds that she can keep an eye on this at home.  DM:  I will defer to Mathews Argyle, MD   DYSLIPIDEMIA:  Given her significant vascular history I will suggest a goal LDL less than 70 and I will again defer to Mathews Argyle, MD   Current medicines are reviewed at length with the patient today.  The patient does not have concerns regarding medicines.  The following changes have been made:  no change  Labs/ tests ordered today include:   No orders of the defined types were placed in this encounter.     Disposition:   FU with me in one year.     Signed, Minus Breeding, MD  05/31/2015 2:20 PM    [facility]

## 2015-06-11 ENCOUNTER — Ambulatory Visit: Payer: BLUE CROSS/BLUE SHIELD | Admitting: Cardiology

## 2015-06-12 DIAGNOSIS — I739 Peripheral vascular disease, unspecified: Secondary | ICD-10-CM | POA: Diagnosis not present

## 2015-06-12 DIAGNOSIS — E1151 Type 2 diabetes mellitus with diabetic peripheral angiopathy without gangrene: Secondary | ICD-10-CM | POA: Diagnosis not present

## 2015-06-12 DIAGNOSIS — L603 Nail dystrophy: Secondary | ICD-10-CM | POA: Diagnosis not present

## 2015-06-12 DIAGNOSIS — M79609 Pain in unspecified limb: Secondary | ICD-10-CM | POA: Diagnosis not present

## 2015-06-19 DIAGNOSIS — M25552 Pain in left hip: Secondary | ICD-10-CM | POA: Diagnosis not present

## 2015-07-06 DIAGNOSIS — I10 Essential (primary) hypertension: Secondary | ICD-10-CM | POA: Diagnosis not present

## 2015-07-06 DIAGNOSIS — E119 Type 2 diabetes mellitus without complications: Secondary | ICD-10-CM | POA: Diagnosis not present

## 2015-07-13 DIAGNOSIS — L570 Actinic keratosis: Secondary | ICD-10-CM | POA: Diagnosis not present

## 2015-07-13 DIAGNOSIS — L905 Scar conditions and fibrosis of skin: Secondary | ICD-10-CM | POA: Diagnosis not present

## 2015-07-13 DIAGNOSIS — C44612 Basal cell carcinoma of skin of right upper limb, including shoulder: Secondary | ICD-10-CM | POA: Diagnosis not present

## 2015-07-13 DIAGNOSIS — D044 Carcinoma in situ of skin of scalp and neck: Secondary | ICD-10-CM | POA: Diagnosis not present

## 2015-07-24 DIAGNOSIS — L0889 Other specified local infections of the skin and subcutaneous tissue: Secondary | ICD-10-CM | POA: Diagnosis not present

## 2015-07-24 DIAGNOSIS — L03115 Cellulitis of right lower limb: Secondary | ICD-10-CM | POA: Diagnosis not present

## 2015-07-24 DIAGNOSIS — D044 Carcinoma in situ of skin of scalp and neck: Secondary | ICD-10-CM | POA: Diagnosis not present

## 2015-07-24 DIAGNOSIS — B9562 Methicillin resistant Staphylococcus aureus infection as the cause of diseases classified elsewhere: Secondary | ICD-10-CM | POA: Diagnosis not present

## 2015-07-24 DIAGNOSIS — Z85828 Personal history of other malignant neoplasm of skin: Secondary | ICD-10-CM | POA: Diagnosis not present

## 2015-08-14 DIAGNOSIS — M25552 Pain in left hip: Secondary | ICD-10-CM | POA: Diagnosis not present

## 2015-08-30 DIAGNOSIS — I739 Peripheral vascular disease, unspecified: Secondary | ICD-10-CM | POA: Diagnosis not present

## 2015-08-30 DIAGNOSIS — E1151 Type 2 diabetes mellitus with diabetic peripheral angiopathy without gangrene: Secondary | ICD-10-CM | POA: Diagnosis not present

## 2015-08-30 DIAGNOSIS — L603 Nail dystrophy: Secondary | ICD-10-CM | POA: Diagnosis not present

## 2015-09-13 DIAGNOSIS — Z23 Encounter for immunization: Secondary | ICD-10-CM | POA: Diagnosis not present

## 2015-10-19 DIAGNOSIS — E1140 Type 2 diabetes mellitus with diabetic neuropathy, unspecified: Secondary | ICD-10-CM | POA: Diagnosis not present

## 2015-10-19 DIAGNOSIS — E119 Type 2 diabetes mellitus without complications: Secondary | ICD-10-CM | POA: Diagnosis not present

## 2015-10-19 DIAGNOSIS — E1121 Type 2 diabetes mellitus with diabetic nephropathy: Secondary | ICD-10-CM | POA: Diagnosis not present

## 2015-10-19 DIAGNOSIS — E7800 Pure hypercholesterolemia, unspecified: Secondary | ICD-10-CM | POA: Diagnosis not present

## 2015-10-19 DIAGNOSIS — N183 Chronic kidney disease, stage 3 (moderate): Secondary | ICD-10-CM | POA: Diagnosis not present

## 2015-10-19 DIAGNOSIS — E611 Iron deficiency: Secondary | ICD-10-CM | POA: Diagnosis not present

## 2015-10-19 DIAGNOSIS — Z0000 Encounter for general adult medical examination without abnormal findings: Secondary | ICD-10-CM | POA: Diagnosis not present

## 2015-10-19 DIAGNOSIS — M810 Age-related osteoporosis without current pathological fracture: Secondary | ICD-10-CM | POA: Diagnosis not present

## 2015-10-19 DIAGNOSIS — I129 Hypertensive chronic kidney disease with stage 1 through stage 4 chronic kidney disease, or unspecified chronic kidney disease: Secondary | ICD-10-CM | POA: Diagnosis not present

## 2015-10-19 DIAGNOSIS — Z79899 Other long term (current) drug therapy: Secondary | ICD-10-CM | POA: Diagnosis not present

## 2015-10-19 DIAGNOSIS — F325 Major depressive disorder, single episode, in full remission: Secondary | ICD-10-CM | POA: Diagnosis not present

## 2015-10-19 DIAGNOSIS — I10 Essential (primary) hypertension: Secondary | ICD-10-CM | POA: Diagnosis not present

## 2015-11-15 DIAGNOSIS — G9009 Other idiopathic peripheral autonomic neuropathy: Secondary | ICD-10-CM | POA: Diagnosis not present

## 2015-11-15 DIAGNOSIS — L603 Nail dystrophy: Secondary | ICD-10-CM | POA: Diagnosis not present

## 2015-11-15 DIAGNOSIS — I739 Peripheral vascular disease, unspecified: Secondary | ICD-10-CM | POA: Diagnosis not present

## 2015-11-15 DIAGNOSIS — E1151 Type 2 diabetes mellitus with diabetic peripheral angiopathy without gangrene: Secondary | ICD-10-CM | POA: Diagnosis not present

## 2015-12-05 DIAGNOSIS — M810 Age-related osteoporosis without current pathological fracture: Secondary | ICD-10-CM | POA: Diagnosis not present

## 2015-12-12 DIAGNOSIS — E119 Type 2 diabetes mellitus without complications: Secondary | ICD-10-CM | POA: Diagnosis not present

## 2015-12-12 DIAGNOSIS — H1859 Other hereditary corneal dystrophies: Secondary | ICD-10-CM | POA: Diagnosis not present

## 2015-12-12 DIAGNOSIS — H5213 Myopia, bilateral: Secondary | ICD-10-CM | POA: Diagnosis not present

## 2015-12-12 DIAGNOSIS — H04123 Dry eye syndrome of bilateral lacrimal glands: Secondary | ICD-10-CM | POA: Diagnosis not present

## 2016-01-01 DIAGNOSIS — J069 Acute upper respiratory infection, unspecified: Secondary | ICD-10-CM | POA: Diagnosis not present

## 2016-01-18 DIAGNOSIS — L720 Epidermal cyst: Secondary | ICD-10-CM | POA: Diagnosis not present

## 2016-01-18 DIAGNOSIS — L814 Other melanin hyperpigmentation: Secondary | ICD-10-CM | POA: Diagnosis not present

## 2016-01-18 DIAGNOSIS — Z85828 Personal history of other malignant neoplasm of skin: Secondary | ICD-10-CM | POA: Diagnosis not present

## 2016-01-18 DIAGNOSIS — D2271 Melanocytic nevi of right lower limb, including hip: Secondary | ICD-10-CM | POA: Diagnosis not present

## 2016-01-18 DIAGNOSIS — D1801 Hemangioma of skin and subcutaneous tissue: Secondary | ICD-10-CM | POA: Diagnosis not present

## 2016-01-18 DIAGNOSIS — L821 Other seborrheic keratosis: Secondary | ICD-10-CM | POA: Diagnosis not present

## 2016-04-15 DIAGNOSIS — R269 Unspecified abnormalities of gait and mobility: Secondary | ICD-10-CM | POA: Diagnosis not present

## 2016-04-15 DIAGNOSIS — E7800 Pure hypercholesterolemia, unspecified: Secondary | ICD-10-CM | POA: Diagnosis not present

## 2016-04-15 DIAGNOSIS — Z7984 Long term (current) use of oral hypoglycemic drugs: Secondary | ICD-10-CM | POA: Diagnosis not present

## 2016-04-15 DIAGNOSIS — I480 Paroxysmal atrial fibrillation: Secondary | ICD-10-CM | POA: Diagnosis not present

## 2016-04-15 DIAGNOSIS — E1121 Type 2 diabetes mellitus with diabetic nephropathy: Secondary | ICD-10-CM | POA: Diagnosis not present

## 2016-04-15 DIAGNOSIS — I498 Other specified cardiac arrhythmias: Secondary | ICD-10-CM | POA: Diagnosis not present

## 2016-04-15 DIAGNOSIS — N183 Chronic kidney disease, stage 3 (moderate): Secondary | ICD-10-CM | POA: Diagnosis not present

## 2016-04-15 DIAGNOSIS — E1140 Type 2 diabetes mellitus with diabetic neuropathy, unspecified: Secondary | ICD-10-CM | POA: Diagnosis not present

## 2016-04-15 DIAGNOSIS — I129 Hypertensive chronic kidney disease with stage 1 through stage 4 chronic kidney disease, or unspecified chronic kidney disease: Secondary | ICD-10-CM | POA: Diagnosis not present

## 2016-05-05 DIAGNOSIS — R41841 Cognitive communication deficit: Secondary | ICD-10-CM | POA: Diagnosis not present

## 2016-05-05 DIAGNOSIS — M6281 Muscle weakness (generalized): Secondary | ICD-10-CM | POA: Diagnosis not present

## 2016-05-05 DIAGNOSIS — R262 Difficulty in walking, not elsewhere classified: Secondary | ICD-10-CM | POA: Diagnosis not present

## 2016-05-05 DIAGNOSIS — R278 Other lack of coordination: Secondary | ICD-10-CM | POA: Diagnosis not present

## 2016-05-07 ENCOUNTER — Other Ambulatory Visit (HOSPITAL_COMMUNITY): Payer: Self-pay | Admitting: Geriatric Medicine

## 2016-05-07 DIAGNOSIS — R278 Other lack of coordination: Secondary | ICD-10-CM | POA: Diagnosis not present

## 2016-05-07 DIAGNOSIS — I4891 Unspecified atrial fibrillation: Secondary | ICD-10-CM

## 2016-05-07 DIAGNOSIS — M6281 Muscle weakness (generalized): Secondary | ICD-10-CM | POA: Diagnosis not present

## 2016-05-07 DIAGNOSIS — R41841 Cognitive communication deficit: Secondary | ICD-10-CM | POA: Diagnosis not present

## 2016-05-07 DIAGNOSIS — R262 Difficulty in walking, not elsewhere classified: Secondary | ICD-10-CM | POA: Diagnosis not present

## 2016-05-09 DIAGNOSIS — R41841 Cognitive communication deficit: Secondary | ICD-10-CM | POA: Diagnosis not present

## 2016-05-09 DIAGNOSIS — R262 Difficulty in walking, not elsewhere classified: Secondary | ICD-10-CM | POA: Diagnosis not present

## 2016-05-09 DIAGNOSIS — R278 Other lack of coordination: Secondary | ICD-10-CM | POA: Diagnosis not present

## 2016-05-09 DIAGNOSIS — M6281 Muscle weakness (generalized): Secondary | ICD-10-CM | POA: Diagnosis not present

## 2016-05-12 DIAGNOSIS — R41841 Cognitive communication deficit: Secondary | ICD-10-CM | POA: Diagnosis not present

## 2016-05-12 DIAGNOSIS — M6281 Muscle weakness (generalized): Secondary | ICD-10-CM | POA: Diagnosis not present

## 2016-05-12 DIAGNOSIS — R278 Other lack of coordination: Secondary | ICD-10-CM | POA: Diagnosis not present

## 2016-05-12 DIAGNOSIS — R262 Difficulty in walking, not elsewhere classified: Secondary | ICD-10-CM | POA: Diagnosis not present

## 2016-05-14 DIAGNOSIS — R278 Other lack of coordination: Secondary | ICD-10-CM | POA: Diagnosis not present

## 2016-05-14 DIAGNOSIS — R262 Difficulty in walking, not elsewhere classified: Secondary | ICD-10-CM | POA: Diagnosis not present

## 2016-05-14 DIAGNOSIS — R41841 Cognitive communication deficit: Secondary | ICD-10-CM | POA: Diagnosis not present

## 2016-05-14 DIAGNOSIS — M6281 Muscle weakness (generalized): Secondary | ICD-10-CM | POA: Diagnosis not present

## 2016-05-16 DIAGNOSIS — M6281 Muscle weakness (generalized): Secondary | ICD-10-CM | POA: Diagnosis not present

## 2016-05-16 DIAGNOSIS — R41841 Cognitive communication deficit: Secondary | ICD-10-CM | POA: Diagnosis not present

## 2016-05-16 DIAGNOSIS — R278 Other lack of coordination: Secondary | ICD-10-CM | POA: Diagnosis not present

## 2016-05-16 DIAGNOSIS — R262 Difficulty in walking, not elsewhere classified: Secondary | ICD-10-CM | POA: Diagnosis not present

## 2016-05-19 DIAGNOSIS — R278 Other lack of coordination: Secondary | ICD-10-CM | POA: Diagnosis not present

## 2016-05-19 DIAGNOSIS — M6281 Muscle weakness (generalized): Secondary | ICD-10-CM | POA: Diagnosis not present

## 2016-05-19 DIAGNOSIS — R41841 Cognitive communication deficit: Secondary | ICD-10-CM | POA: Diagnosis not present

## 2016-05-19 DIAGNOSIS — R262 Difficulty in walking, not elsewhere classified: Secondary | ICD-10-CM | POA: Diagnosis not present

## 2016-05-20 DIAGNOSIS — R41841 Cognitive communication deficit: Secondary | ICD-10-CM | POA: Diagnosis not present

## 2016-05-20 DIAGNOSIS — R278 Other lack of coordination: Secondary | ICD-10-CM | POA: Diagnosis not present

## 2016-05-20 DIAGNOSIS — R262 Difficulty in walking, not elsewhere classified: Secondary | ICD-10-CM | POA: Diagnosis not present

## 2016-05-20 DIAGNOSIS — M6281 Muscle weakness (generalized): Secondary | ICD-10-CM | POA: Diagnosis not present

## 2016-05-22 ENCOUNTER — Ambulatory Visit (HOSPITAL_COMMUNITY): Payer: Medicare Other | Attending: Cardiology

## 2016-05-22 ENCOUNTER — Other Ambulatory Visit: Payer: Self-pay

## 2016-05-22 DIAGNOSIS — I351 Nonrheumatic aortic (valve) insufficiency: Secondary | ICD-10-CM | POA: Diagnosis not present

## 2016-05-22 DIAGNOSIS — E119 Type 2 diabetes mellitus without complications: Secondary | ICD-10-CM | POA: Diagnosis not present

## 2016-05-22 DIAGNOSIS — I340 Nonrheumatic mitral (valve) insufficiency: Secondary | ICD-10-CM | POA: Diagnosis not present

## 2016-05-22 DIAGNOSIS — I4891 Unspecified atrial fibrillation: Secondary | ICD-10-CM | POA: Insufficient documentation

## 2016-05-22 DIAGNOSIS — I119 Hypertensive heart disease without heart failure: Secondary | ICD-10-CM | POA: Diagnosis not present

## 2016-05-22 DIAGNOSIS — E785 Hyperlipidemia, unspecified: Secondary | ICD-10-CM | POA: Diagnosis not present

## 2016-05-22 LAB — ECHOCARDIOGRAM COMPLETE
E decel time: 151 msec
FS: 22 % — AB (ref 28–44)
IV/PV OW: 0.94
LA diam index: 2.88 cm/m2
LA vol A4C: 86 ml
LA vol index: 54 mL/m2
LA vol: 88 mL
LASIZE: 47 mm
LDCA: 3.14 cm2
LEFT ATRIUM END SYS DIAM: 47 mm
LV PW d: 9.96 mm — AB (ref 0.6–1.1)
LVOT VTI: 10.1 cm
LVOT peak grad rest: 1 mmHg
LVOT peak vel: 60.6 cm/s
LVOTD: 20 mm
LVOTSV: 32 mL
MV Dec: 151
MV pk A vel: 62.4 m/s
MV pk E vel: 106 m/s
MVPG: 4 mmHg
Reg peak vel: 250 cm/s
TR max vel: 250 cm/s
VTI: 195 cm

## 2016-05-23 DIAGNOSIS — M6281 Muscle weakness (generalized): Secondary | ICD-10-CM | POA: Diagnosis not present

## 2016-05-23 DIAGNOSIS — R262 Difficulty in walking, not elsewhere classified: Secondary | ICD-10-CM | POA: Diagnosis not present

## 2016-05-23 DIAGNOSIS — R278 Other lack of coordination: Secondary | ICD-10-CM | POA: Diagnosis not present

## 2016-05-23 DIAGNOSIS — R41841 Cognitive communication deficit: Secondary | ICD-10-CM | POA: Diagnosis not present

## 2016-05-26 DIAGNOSIS — R278 Other lack of coordination: Secondary | ICD-10-CM | POA: Diagnosis not present

## 2016-05-26 DIAGNOSIS — R41841 Cognitive communication deficit: Secondary | ICD-10-CM | POA: Diagnosis not present

## 2016-05-26 DIAGNOSIS — R262 Difficulty in walking, not elsewhere classified: Secondary | ICD-10-CM | POA: Diagnosis not present

## 2016-05-26 DIAGNOSIS — M6281 Muscle weakness (generalized): Secondary | ICD-10-CM | POA: Diagnosis not present

## 2016-05-28 DIAGNOSIS — R278 Other lack of coordination: Secondary | ICD-10-CM | POA: Diagnosis not present

## 2016-05-28 DIAGNOSIS — R262 Difficulty in walking, not elsewhere classified: Secondary | ICD-10-CM | POA: Diagnosis not present

## 2016-05-28 DIAGNOSIS — R41841 Cognitive communication deficit: Secondary | ICD-10-CM | POA: Diagnosis not present

## 2016-05-28 DIAGNOSIS — M6281 Muscle weakness (generalized): Secondary | ICD-10-CM | POA: Diagnosis not present

## 2016-05-29 DIAGNOSIS — R262 Difficulty in walking, not elsewhere classified: Secondary | ICD-10-CM | POA: Diagnosis not present

## 2016-05-29 DIAGNOSIS — R278 Other lack of coordination: Secondary | ICD-10-CM | POA: Diagnosis not present

## 2016-05-29 DIAGNOSIS — M6281 Muscle weakness (generalized): Secondary | ICD-10-CM | POA: Diagnosis not present

## 2016-05-29 DIAGNOSIS — R41841 Cognitive communication deficit: Secondary | ICD-10-CM | POA: Diagnosis not present

## 2016-06-02 DIAGNOSIS — M6281 Muscle weakness (generalized): Secondary | ICD-10-CM | POA: Diagnosis not present

## 2016-06-02 DIAGNOSIS — R296 Repeated falls: Secondary | ICD-10-CM | POA: Diagnosis not present

## 2016-06-02 DIAGNOSIS — R262 Difficulty in walking, not elsewhere classified: Secondary | ICD-10-CM | POA: Diagnosis not present

## 2016-06-02 DIAGNOSIS — R278 Other lack of coordination: Secondary | ICD-10-CM | POA: Diagnosis not present

## 2016-06-02 DIAGNOSIS — R2681 Unsteadiness on feet: Secondary | ICD-10-CM | POA: Diagnosis not present

## 2016-06-02 DIAGNOSIS — R41841 Cognitive communication deficit: Secondary | ICD-10-CM | POA: Diagnosis not present

## 2016-06-04 DIAGNOSIS — R262 Difficulty in walking, not elsewhere classified: Secondary | ICD-10-CM | POA: Diagnosis not present

## 2016-06-04 DIAGNOSIS — R2681 Unsteadiness on feet: Secondary | ICD-10-CM | POA: Diagnosis not present

## 2016-06-04 DIAGNOSIS — M6281 Muscle weakness (generalized): Secondary | ICD-10-CM | POA: Diagnosis not present

## 2016-06-04 DIAGNOSIS — R296 Repeated falls: Secondary | ICD-10-CM | POA: Diagnosis not present

## 2016-06-04 DIAGNOSIS — R41841 Cognitive communication deficit: Secondary | ICD-10-CM | POA: Diagnosis not present

## 2016-06-04 DIAGNOSIS — R278 Other lack of coordination: Secondary | ICD-10-CM | POA: Diagnosis not present

## 2016-06-05 DIAGNOSIS — R278 Other lack of coordination: Secondary | ICD-10-CM | POA: Diagnosis not present

## 2016-06-05 DIAGNOSIS — R262 Difficulty in walking, not elsewhere classified: Secondary | ICD-10-CM | POA: Diagnosis not present

## 2016-06-05 DIAGNOSIS — R2681 Unsteadiness on feet: Secondary | ICD-10-CM | POA: Diagnosis not present

## 2016-06-05 DIAGNOSIS — R41841 Cognitive communication deficit: Secondary | ICD-10-CM | POA: Diagnosis not present

## 2016-06-05 DIAGNOSIS — R296 Repeated falls: Secondary | ICD-10-CM | POA: Diagnosis not present

## 2016-06-05 DIAGNOSIS — M6281 Muscle weakness (generalized): Secondary | ICD-10-CM | POA: Diagnosis not present

## 2016-06-06 DIAGNOSIS — R278 Other lack of coordination: Secondary | ICD-10-CM | POA: Diagnosis not present

## 2016-06-06 DIAGNOSIS — R296 Repeated falls: Secondary | ICD-10-CM | POA: Diagnosis not present

## 2016-06-06 DIAGNOSIS — R41841 Cognitive communication deficit: Secondary | ICD-10-CM | POA: Diagnosis not present

## 2016-06-06 DIAGNOSIS — R262 Difficulty in walking, not elsewhere classified: Secondary | ICD-10-CM | POA: Diagnosis not present

## 2016-06-06 DIAGNOSIS — R2681 Unsteadiness on feet: Secondary | ICD-10-CM | POA: Diagnosis not present

## 2016-06-06 DIAGNOSIS — M6281 Muscle weakness (generalized): Secondary | ICD-10-CM | POA: Diagnosis not present

## 2016-06-10 DIAGNOSIS — R262 Difficulty in walking, not elsewhere classified: Secondary | ICD-10-CM | POA: Diagnosis not present

## 2016-06-10 DIAGNOSIS — R296 Repeated falls: Secondary | ICD-10-CM | POA: Diagnosis not present

## 2016-06-10 DIAGNOSIS — R41841 Cognitive communication deficit: Secondary | ICD-10-CM | POA: Diagnosis not present

## 2016-06-10 DIAGNOSIS — R2681 Unsteadiness on feet: Secondary | ICD-10-CM | POA: Diagnosis not present

## 2016-06-10 DIAGNOSIS — M6281 Muscle weakness (generalized): Secondary | ICD-10-CM | POA: Diagnosis not present

## 2016-06-10 DIAGNOSIS — R278 Other lack of coordination: Secondary | ICD-10-CM | POA: Diagnosis not present

## 2016-06-11 DIAGNOSIS — R262 Difficulty in walking, not elsewhere classified: Secondary | ICD-10-CM | POA: Diagnosis not present

## 2016-06-11 DIAGNOSIS — R278 Other lack of coordination: Secondary | ICD-10-CM | POA: Diagnosis not present

## 2016-06-11 DIAGNOSIS — R41841 Cognitive communication deficit: Secondary | ICD-10-CM | POA: Diagnosis not present

## 2016-06-11 DIAGNOSIS — R296 Repeated falls: Secondary | ICD-10-CM | POA: Diagnosis not present

## 2016-06-11 DIAGNOSIS — M6281 Muscle weakness (generalized): Secondary | ICD-10-CM | POA: Diagnosis not present

## 2016-06-11 DIAGNOSIS — R2681 Unsteadiness on feet: Secondary | ICD-10-CM | POA: Diagnosis not present

## 2016-06-13 DIAGNOSIS — R2681 Unsteadiness on feet: Secondary | ICD-10-CM | POA: Diagnosis not present

## 2016-06-13 DIAGNOSIS — R278 Other lack of coordination: Secondary | ICD-10-CM | POA: Diagnosis not present

## 2016-06-13 DIAGNOSIS — R296 Repeated falls: Secondary | ICD-10-CM | POA: Diagnosis not present

## 2016-06-13 DIAGNOSIS — R262 Difficulty in walking, not elsewhere classified: Secondary | ICD-10-CM | POA: Diagnosis not present

## 2016-06-13 DIAGNOSIS — R41841 Cognitive communication deficit: Secondary | ICD-10-CM | POA: Diagnosis not present

## 2016-06-13 DIAGNOSIS — M6281 Muscle weakness (generalized): Secondary | ICD-10-CM | POA: Diagnosis not present

## 2016-06-16 DIAGNOSIS — R262 Difficulty in walking, not elsewhere classified: Secondary | ICD-10-CM | POA: Diagnosis not present

## 2016-06-16 DIAGNOSIS — R296 Repeated falls: Secondary | ICD-10-CM | POA: Diagnosis not present

## 2016-06-16 DIAGNOSIS — R2681 Unsteadiness on feet: Secondary | ICD-10-CM | POA: Diagnosis not present

## 2016-06-16 DIAGNOSIS — R278 Other lack of coordination: Secondary | ICD-10-CM | POA: Diagnosis not present

## 2016-06-16 DIAGNOSIS — M6281 Muscle weakness (generalized): Secondary | ICD-10-CM | POA: Diagnosis not present

## 2016-06-16 DIAGNOSIS — R41841 Cognitive communication deficit: Secondary | ICD-10-CM | POA: Diagnosis not present

## 2016-06-18 DIAGNOSIS — M6281 Muscle weakness (generalized): Secondary | ICD-10-CM | POA: Diagnosis not present

## 2016-06-18 DIAGNOSIS — R41841 Cognitive communication deficit: Secondary | ICD-10-CM | POA: Diagnosis not present

## 2016-06-18 DIAGNOSIS — R2681 Unsteadiness on feet: Secondary | ICD-10-CM | POA: Diagnosis not present

## 2016-06-18 DIAGNOSIS — R278 Other lack of coordination: Secondary | ICD-10-CM | POA: Diagnosis not present

## 2016-06-18 DIAGNOSIS — R296 Repeated falls: Secondary | ICD-10-CM | POA: Diagnosis not present

## 2016-06-18 DIAGNOSIS — R262 Difficulty in walking, not elsewhere classified: Secondary | ICD-10-CM | POA: Diagnosis not present

## 2016-06-19 DIAGNOSIS — R262 Difficulty in walking, not elsewhere classified: Secondary | ICD-10-CM | POA: Diagnosis not present

## 2016-06-19 DIAGNOSIS — M6281 Muscle weakness (generalized): Secondary | ICD-10-CM | POA: Diagnosis not present

## 2016-06-19 DIAGNOSIS — F331 Major depressive disorder, recurrent, moderate: Secondary | ICD-10-CM | POA: Diagnosis not present

## 2016-06-19 DIAGNOSIS — R41841 Cognitive communication deficit: Secondary | ICD-10-CM | POA: Diagnosis not present

## 2016-06-19 DIAGNOSIS — R296 Repeated falls: Secondary | ICD-10-CM | POA: Diagnosis not present

## 2016-06-19 DIAGNOSIS — R278 Other lack of coordination: Secondary | ICD-10-CM | POA: Diagnosis not present

## 2016-06-19 DIAGNOSIS — R2681 Unsteadiness on feet: Secondary | ICD-10-CM | POA: Diagnosis not present

## 2016-06-20 DIAGNOSIS — M6281 Muscle weakness (generalized): Secondary | ICD-10-CM | POA: Diagnosis not present

## 2016-06-20 DIAGNOSIS — R2681 Unsteadiness on feet: Secondary | ICD-10-CM | POA: Diagnosis not present

## 2016-06-20 DIAGNOSIS — R41841 Cognitive communication deficit: Secondary | ICD-10-CM | POA: Diagnosis not present

## 2016-06-20 DIAGNOSIS — R296 Repeated falls: Secondary | ICD-10-CM | POA: Diagnosis not present

## 2016-06-20 DIAGNOSIS — R278 Other lack of coordination: Secondary | ICD-10-CM | POA: Diagnosis not present

## 2016-06-20 DIAGNOSIS — R262 Difficulty in walking, not elsewhere classified: Secondary | ICD-10-CM | POA: Diagnosis not present

## 2016-06-24 DIAGNOSIS — R278 Other lack of coordination: Secondary | ICD-10-CM | POA: Diagnosis not present

## 2016-06-24 DIAGNOSIS — M6281 Muscle weakness (generalized): Secondary | ICD-10-CM | POA: Diagnosis not present

## 2016-06-24 DIAGNOSIS — R41841 Cognitive communication deficit: Secondary | ICD-10-CM | POA: Diagnosis not present

## 2016-06-24 DIAGNOSIS — R262 Difficulty in walking, not elsewhere classified: Secondary | ICD-10-CM | POA: Diagnosis not present

## 2016-06-24 DIAGNOSIS — R296 Repeated falls: Secondary | ICD-10-CM | POA: Diagnosis not present

## 2016-06-24 DIAGNOSIS — R2681 Unsteadiness on feet: Secondary | ICD-10-CM | POA: Diagnosis not present

## 2016-06-26 ENCOUNTER — Emergency Department (HOSPITAL_COMMUNITY): Payer: Medicare Other

## 2016-06-26 ENCOUNTER — Encounter (HOSPITAL_COMMUNITY): Payer: Self-pay

## 2016-06-26 ENCOUNTER — Emergency Department (HOSPITAL_COMMUNITY)
Admission: EM | Admit: 2016-06-26 | Discharge: 2016-06-26 | Disposition: A | Discharge status: Home / Self Care | Payer: Medicare Other | Attending: Emergency Medicine | Admitting: Emergency Medicine

## 2016-06-26 DIAGNOSIS — Z7984 Long term (current) use of oral hypoglycemic drugs: Secondary | ICD-10-CM | POA: Diagnosis not present

## 2016-06-26 DIAGNOSIS — I10 Essential (primary) hypertension: Secondary | ICD-10-CM | POA: Diagnosis not present

## 2016-06-26 DIAGNOSIS — M546 Pain in thoracic spine: Secondary | ICD-10-CM | POA: Diagnosis not present

## 2016-06-26 DIAGNOSIS — W19XXXA Unspecified fall, initial encounter: Secondary | ICD-10-CM

## 2016-06-26 DIAGNOSIS — Y92009 Unspecified place in unspecified non-institutional (private) residence as the place of occurrence of the external cause: Secondary | ICD-10-CM | POA: Insufficient documentation

## 2016-06-26 DIAGNOSIS — Z955 Presence of coronary angioplasty implant and graft: Secondary | ICD-10-CM | POA: Insufficient documentation

## 2016-06-26 DIAGNOSIS — E1140 Type 2 diabetes mellitus with diabetic neuropathy, unspecified: Secondary | ICD-10-CM | POA: Diagnosis not present

## 2016-06-26 DIAGNOSIS — W228XXA Striking against or struck by other objects, initial encounter: Secondary | ICD-10-CM | POA: Diagnosis not present

## 2016-06-26 DIAGNOSIS — R262 Difficulty in walking, not elsewhere classified: Secondary | ICD-10-CM | POA: Diagnosis not present

## 2016-06-26 DIAGNOSIS — M542 Cervicalgia: Secondary | ICD-10-CM | POA: Insufficient documentation

## 2016-06-26 DIAGNOSIS — S299XXA Unspecified injury of thorax, initial encounter: Secondary | ICD-10-CM | POA: Diagnosis not present

## 2016-06-26 DIAGNOSIS — Y999 Unspecified external cause status: Secondary | ICD-10-CM | POA: Diagnosis not present

## 2016-06-26 DIAGNOSIS — I2510 Atherosclerotic heart disease of native coronary artery without angina pectoris: Secondary | ICD-10-CM | POA: Diagnosis not present

## 2016-06-26 DIAGNOSIS — R2681 Unsteadiness on feet: Secondary | ICD-10-CM | POA: Diagnosis not present

## 2016-06-26 DIAGNOSIS — Z951 Presence of aortocoronary bypass graft: Secondary | ICD-10-CM | POA: Insufficient documentation

## 2016-06-26 DIAGNOSIS — Y939 Activity, unspecified: Secondary | ICD-10-CM | POA: Diagnosis not present

## 2016-06-26 DIAGNOSIS — M6281 Muscle weakness (generalized): Secondary | ICD-10-CM | POA: Diagnosis not present

## 2016-06-26 DIAGNOSIS — Z7901 Long term (current) use of anticoagulants: Secondary | ICD-10-CM | POA: Insufficient documentation

## 2016-06-26 DIAGNOSIS — S0093XA Contusion of unspecified part of head, initial encounter: Secondary | ICD-10-CM | POA: Insufficient documentation

## 2016-06-26 DIAGNOSIS — R296 Repeated falls: Secondary | ICD-10-CM | POA: Diagnosis not present

## 2016-06-26 DIAGNOSIS — S199XXA Unspecified injury of neck, initial encounter: Secondary | ICD-10-CM | POA: Diagnosis not present

## 2016-06-26 DIAGNOSIS — R41841 Cognitive communication deficit: Secondary | ICD-10-CM | POA: Diagnosis not present

## 2016-06-26 DIAGNOSIS — R6889 Other general symptoms and signs: Secondary | ICD-10-CM | POA: Diagnosis not present

## 2016-06-26 DIAGNOSIS — R278 Other lack of coordination: Secondary | ICD-10-CM | POA: Diagnosis not present

## 2016-06-26 DIAGNOSIS — Z79899 Other long term (current) drug therapy: Secondary | ICD-10-CM | POA: Insufficient documentation

## 2016-06-26 DIAGNOSIS — T1490 Injury, unspecified: Secondary | ICD-10-CM | POA: Diagnosis not present

## 2016-06-26 DIAGNOSIS — S0990XA Unspecified injury of head, initial encounter: Secondary | ICD-10-CM | POA: Diagnosis not present

## 2016-06-26 LAB — BASIC METABOLIC PANEL
ANION GAP: 9 (ref 5–15)
BUN: 16 mg/dL (ref 6–20)
CHLORIDE: 103 mmol/L (ref 101–111)
CO2: 27 mmol/L (ref 22–32)
Calcium: 9.1 mg/dL (ref 8.9–10.3)
Creatinine, Ser: 1.15 mg/dL — ABNORMAL HIGH (ref 0.44–1.00)
GFR calc Af Amer: 50 mL/min — ABNORMAL LOW (ref >60)
GFR calc non Af Amer: 43 mL/min — ABNORMAL LOW (ref >60)
GLUCOSE: 252 mg/dL — AB (ref 65–99)
POTASSIUM: 4.4 mmol/L (ref 3.5–5.1)
Sodium: 139 mmol/L (ref 135–145)

## 2016-06-26 LAB — CBC WITH DIFFERENTIAL/PLATELET
Basophils Absolute: 0 10*3/uL (ref 0.0–0.1)
Basophils Relative: 0 %
EOS PCT: 3 %
Eosinophils Absolute: 0.2 10*3/uL (ref 0.0–0.7)
HEMATOCRIT: 33.4 % — AB (ref 36.0–46.0)
Hemoglobin: 10.8 g/dL — ABNORMAL LOW (ref 12.0–15.0)
LYMPHS ABS: 3.5 10*3/uL (ref 0.7–4.0)
LYMPHS PCT: 43 %
MCH: 32.1 pg (ref 26.0–34.0)
MCHC: 32.3 g/dL (ref 30.0–36.0)
MCV: 99.4 fL (ref 78.0–100.0)
Monocytes Absolute: 0.6 10*3/uL (ref 0.1–1.0)
Monocytes Relative: 7 %
NEUTROS ABS: 3.9 10*3/uL (ref 1.7–7.7)
Neutrophils Relative %: 47 %
PLATELETS: 192 10*3/uL (ref 150–400)
RBC: 3.36 MIL/uL — AB (ref 3.87–5.11)
RDW: 13.2 % (ref 11.5–15.5)
WBC: 8.3 10*3/uL (ref 4.0–10.5)

## 2016-06-26 LAB — URINALYSIS, ROUTINE W REFLEX MICROSCOPIC
Bilirubin Urine: NEGATIVE
Glucose, UA: NEGATIVE mg/dL
Ketones, ur: NEGATIVE mg/dL
Nitrite: NEGATIVE
PROTEIN: NEGATIVE mg/dL
Specific Gravity, Urine: 1.018 (ref 1.005–1.030)
pH: 5.5 (ref 5.0–8.0)

## 2016-06-26 LAB — URINE MICROSCOPIC-ADD ON

## 2016-06-26 LAB — I-STAT TROPONIN, ED: Troponin i, poc: 0.01 ng/mL (ref 0.00–0.08)

## 2016-06-26 NOTE — ED Notes (Signed)
Pt back in room . No distress from transport observed.

## 2016-06-26 NOTE — ED Provider Notes (Signed)
[facility] DEPT Provider Note   CSN: [id number] Arrival date & time: 06/26/16  [id number]  First Provider Contact:  First MD Initiated Contact with Patient 06/26/16 1910        History   Chief Complaint Chief Complaint  Patient presents with  . Fall    HPI Jessica King is a 82 y.o. female.  HPI   Patient is a 82 year old female with history of peripheral neuropathy with frequent falls, history of hyperlipidemia hypertension, CAD, A. fib on eliquis, presents to emergency room via EMS for a mechanical fall at home where she fell backwards onto a carpeted surface, hitting her head. She denies loss of consciousness. She states that immediate neck pain and gradual onset headache and upper back pain between her shoulder blades. She has a history of neck fracture, and arrived via EMS in c-collar. She has not lost consciousness since the fall, she denies nausea or vomiting. Currently she rates her neck pain is 0 out of 10, states that she has taken her pain medications at home.  She denies any numbness, tingling, chest pain, shortness of breath, abdominal pain.  Past Medical History:  Diagnosis Date  . Allergic rhinitis   . Coronary artery disease    Cardiac cath 1999, 2007, 2010, Taxus stent x 2 to LAD 2007.   . Diabetes mellitus without complication ([place])   . Diverticulosis   . Dyspnea   . Hyperlipidemia   . Hypertension   . Iron deficiency anemia   . Peripheral neuropathy ([facility])    Frequent fall  . PVD (peripheral vascular disease) ([place])    Subclavian stent May 2013 (subclavian steal)    Patient Active Problem List   Diagnosis Date Noted  . Dizziness 05/02/2015  . CAD (coronary artery disease) 05/02/2015  . PVD (peripheral vascular disease) ([place]) 05/02/2015    Past Surgical History:  Procedure Laterality Date  . BACK SURGERY     x 3 or 4 lumbar, cervical x 2  . CEA    . CORONARY ARTERY BYPASS GRAFT  2011   x 3   . Coronary Stents x3      OB History    No data  available       Home Medications    Prior to Admission medications   Medication Sig Start Date End Date Taking? Authorizing Provider  apixaban (ELIQUIS) 2.5 MG TABS tablet Take 2.5 mg by mouth 2 (two) times daily.   Yes Historical Provider, MD  carvedilol (COREG) 3.125 MG tablet Take 3.125 mg by mouth 2 (two) times daily with a meal.   Yes Historical Provider, MD  clonazePAM (KLONOPIN) 0.5 MG tablet Take 0.5 mg by mouth See admin instructions. Takes 1 tab in daytime as needed and 1 tab every bedtime   Yes Historical Provider, MD  gabapentin (NEURONTIN) 300 MG capsule Take 300 mg by mouth 4 (four) times daily. Takes 1 cap three times daily and 2 caps at bedtime   Yes Historical Provider, MD  HYDROcodone-acetaminophen (NORCO) 7.5-325 MG per tablet Take 1 tablet by mouth every 6 (six) hours.    Yes Historical Provider, MD  lisinopril (PRINIVIL,ZESTRIL) 2.5 MG tablet Take 2.5 mg by mouth daily.   Yes Historical Provider, MD  loratadine (CLARITIN) 10 MG tablet Take 10 mg by mouth daily.   Yes Historical Provider, MD  metFORMIN (GLUCOPHAGE) 1000 MG tablet Take 1,000 mg by mouth 2 (two) times daily with a meal.   Yes Historical Provider, MD  methocarbamol (ROBAXIN) 750 MG tablet  Take 750 mg by mouth 2 (two) times daily.   Yes Historical Provider, MD  Multiple Vitamin (MULTIVITAMIN WITH MINERALS) TABS tablet Take 1 tablet by mouth daily.   Yes Historical Provider, MD  PARoxetine (PAXIL) 20 MG tablet Take 20 mg by mouth daily.   Yes Historical Provider, MD  simvastatin (ZOCOR) 40 MG tablet Take 40 mg by mouth daily.   Yes Historical Provider, MD  zolpidem (AMBIEN) 5 MG tablet Take 10 mg by mouth at bedtime.   Yes Historical Provider, MD    Family History Family History  Problem Relation Age of Onset  . Hypertension Mother   . Heart disease Mother     No details.  Died at age 53  . Diabetes Father   . Heart attack Brother 39    Social History Social History  Substance Use Topics  . Smoking  status: Never Smoker  . Smokeless tobacco: Never Used  . Alcohol use No     Allergies   Sulfa antibiotics   Review of Systems Review of Systems  All other systems reviewed and are negative.    Physical Exam Updated Vital Signs BP 147/68 (BP Location: Right Arm)   Pulse 79   Temp 97.6 F (36.4 C) (Oral)   Resp 17   SpO2 98%   Physical Exam  Constitutional: She is oriented to person, place, and time. She appears well-developed and well-nourished. She is cooperative.  Non-toxic appearance. She does not have a sickly appearance. She does not appear ill. No distress. Cervical collar in place.  HENT:  Head: Normocephalic and atraumatic.  Nose: Nose normal.  Mouth/Throat: Oropharynx is clear and moist. No oropharyngeal exudate.  Eyes: Conjunctivae and EOM are normal. Pupils are equal, round, and reactive to light. Right eye exhibits no discharge. Left eye exhibits no discharge. No scleral icterus.  Neck: Phonation normal.  Cardiovascular: Normal rate, normal heart sounds and intact distal pulses.  An irregularly irregular rhythm present. Exam reveals no gallop and no friction rub.   No murmur heard. Pulses:      Radial pulses are 2+ on the right side, and 2+ on the left side.  Pulmonary/Chest: Effort normal and breath sounds normal. No respiratory distress. She has no wheezes. She has no rales. She exhibits no tenderness.  Abdominal: Soft. Bowel sounds are normal. She exhibits no distension and no mass. There is no tenderness. There is no rebound and no guarding.  Musculoskeletal: Normal range of motion. She exhibits no edema or tenderness.  Lymphadenopathy:    She has no cervical adenopathy.  Neurological: She is alert and oriented to person, place, and time. She has normal reflexes. She displays normal reflexes. No cranial nerve deficit. She exhibits normal muscle tone. Coordination normal.  Skin: Skin is warm and dry. No rash noted. She is not diaphoretic. No erythema. No  pallor.  Psychiatric: She has a normal mood and affect. Her behavior is normal. Judgment and thought content normal.  Nursing note and vitals reviewed.    ED Treatments / Results  Labs (all labs ordered are listed, but only abnormal results are displayed) Labs Reviewed  BASIC METABOLIC PANEL - Abnormal; Notable for the following:       Result Value   Glucose, Bld 252 (*)    Creatinine, Ser 1.15 (*)    GFR calc non Af Amer 43 (*)    GFR calc Af Amer 50 (*)    All other components within normal limits  CBC WITH DIFFERENTIAL/PLATELET - Abnormal;  Notable for the following:    RBC 3.36 (*)    Hemoglobin 10.8 (*)    HCT 33.4 (*)    All other components within normal limits  URINALYSIS, ROUTINE W REFLEX MICROSCOPIC (NOT AT [facility]) - Abnormal; Notable for the following:    Hgb urine dipstick SMALL (*)    Leukocytes, UA MODERATE (*)    All other components within normal limits  URINE MICROSCOPIC-ADD ON - Abnormal; Notable for the following:    Squamous Epithelial / LPF 0-5 (*)    Bacteria, UA RARE (*)    All other components within normal limits  URINE CULTURE  I-STAT TROPOININ, ED    EKG  EKG Interpretation None       Radiology Dg Thoracic Spine 2 View  Result Date: 06/26/2016 CLINICAL DATA:  Fall today with upper back pain, initial encounter EXAM: THORACIC SPINE 2 VIEWS COMPARISON:  None. FINDINGS: Vertebral body height is well maintained with the exception of the T12 vertebral body which demonstrates changes of prior vertebral augmentation. Osteophytic changes are noted marked at the T4-5 interspace. Changes of prior cervical spine surgery both anteriorly and posteriorly are noted. Changes of a left subclavian arterial stent are seen. No paraspinal mass lesion is noted. IMPRESSION: Chronic changes without acute abnormality. Electronically Signed   By: Inez Catalina M.D.   On: 06/26/2016 20:08  Ct Head Wo Contrast  Result Date: 06/26/2016 CLINICAL DATA:  Neck pain secondary to a  fall tonight. The patient struck the back of her head. EXAM: CT HEAD WITHOUT CONTRAST CT CERVICAL SPINE WITHOUT CONTRAST TECHNIQUE: Multidetector CT imaging of the head and cervical spine was performed following the standard protocol without intravenous contrast. Multiplanar CT image reconstructions of the cervical spine were also generated. COMPARISON:  None. FINDINGS: CT HEAD FINDINGS No mass lesion. No midline shift. No acute hemorrhage or hematoma. No extra-axial fluid collections. No evidence of acute infarction. There is diffuse cerebral cortical and cerebellar atrophy with secondary ventricular dilatation. No acute bone abnormality. CT CERVICAL SPINE FINDINGS There is no acute fracture or subluxation. Old healed fracture of the odontoid process of C2 with a screw in place. Previous anterior cervical fusion from C5-C7 with a fibula strut graft in place. Previous surgical posterior fusion from C4 to the T2. Auto fusion at T2-3 and T3-4. Moderate right bilateral facet arthritis at C2-3 and C3-4. IMPRESSION: 1. No acute intracranial abnormality.  Diffuse atrophy. 2. No acute abnormality of the cervical spine. Healed fracture of C2. Fusion of the spine from C4 through T4. 3. Aortic atherosclerosis. Electronically Signed   By: Lorriane Shire M.D.   On: 06/26/2016 21:11  Ct Cervical Spine Wo Contrast  Result Date: 06/26/2016 CLINICAL DATA:  Neck pain secondary to a fall tonight. The patient struck the back of her head. EXAM: CT HEAD WITHOUT CONTRAST CT CERVICAL SPINE WITHOUT CONTRAST TECHNIQUE: Multidetector CT imaging of the head and cervical spine was performed following the standard protocol without intravenous contrast. Multiplanar CT image reconstructions of the cervical spine were also generated. COMPARISON:  None. FINDINGS: CT HEAD FINDINGS No mass lesion. No midline shift. No acute hemorrhage or hematoma. No extra-axial fluid collections. No evidence of acute infarction. There is diffuse cerebral  cortical and cerebellar atrophy with secondary ventricular dilatation. No acute bone abnormality. CT CERVICAL SPINE FINDINGS There is no acute fracture or subluxation. Old healed fracture of the odontoid process of C2 with a screw in place. Previous anterior cervical fusion from C5-C7 with a fibula strut graft  in place. Previous surgical posterior fusion from C4 to the T2. Auto fusion at T2-3 and T3-4. Moderate right bilateral facet arthritis at C2-3 and C3-4. IMPRESSION: 1. No acute intracranial abnormality.  Diffuse atrophy. 2. No acute abnormality of the cervical spine. Healed fracture of C2. Fusion of the spine from C4 through T4. 3. Aortic atherosclerosis. Electronically Signed   By: Lorriane Shire M.D.   On: 06/26/2016 21:11   Procedures Procedures (including critical care time)  Medications Ordered in ED Medications - No data to display   Initial Impression / Assessment and Plan / ED Course  I have reviewed the triage vital signs and the nursing notes.  Pertinent labs & imaging results that were available during my care of the patient were reviewed by me and considered in my medical decision making (see chart for details).  Clinical Course  Elderly female with reported mechanical fall secondary to peripheral neuropathy, she did fall backwards hitting her head on a carpeted surface, patient is on eliquis, no LOC.  Pt complains of neck pain and has a history of cervical spine fractures. She also complains of upper midline back pain and denies headache or any other associated symptoms.  Basic labs, urinalysis, Ct head and neck and plain films of T-spine obtained. Urinalysis was pertinent for small hemoglobin, moderate leukocytes and rare bacteria on microscopy, sent for culture, patient does not have any urinary symptoms.  The patient will need antibiotic treatment only if culture is positive.  CT head and neck and plain films of T-spine were negative, c-collar was removed, patient continued  to report no pain.  She was discharged home with her family members, in good condition, stable vital signs.  She was encouraged to follow-up with her PCP next week    Final Clinical Impressions(s) / ED Diagnoses   Final diagnoses:  Fall, initial encounter  Head contusion, initial encounter  Neck pain  Midline thoracic back pain    New Prescriptions Discharge Medication List as of 06/26/2016 10:23 PM       Delsa Grana, PA-C 06/28/16 0133    Leonard Schwartz, MD 07/03/16 1657

## 2016-06-26 NOTE — ED Notes (Signed)
Patient transported to CT. Randall Hiss

## 2016-06-26 NOTE — ED Notes (Signed)
Patient transported to X-ray 

## 2016-06-26 NOTE — ED Triage Notes (Addendum)
Per EMS - pt had ground-level fall at home, has neuropathy and this caused fall. Denies LOC. Hit head. No deformities, bruising or swelling. Pt takes blood thinner for hx CABG.   Hx neck fx in past. Reports discomfort in neck at this time, no other neuro symptoms.

## 2016-06-26 NOTE — ED Notes (Signed)
Pt in afib.  Pt was dropping heart rate into low 40's-high 30's on pulse ox so I placed her on a 12-lead.

## 2016-06-26 NOTE — ED Notes (Signed)
Pt assisted to bathroom

## 2016-06-27 DIAGNOSIS — R278 Other lack of coordination: Secondary | ICD-10-CM | POA: Diagnosis not present

## 2016-06-27 DIAGNOSIS — R262 Difficulty in walking, not elsewhere classified: Secondary | ICD-10-CM | POA: Diagnosis not present

## 2016-06-27 DIAGNOSIS — R2681 Unsteadiness on feet: Secondary | ICD-10-CM | POA: Diagnosis not present

## 2016-06-27 DIAGNOSIS — R41841 Cognitive communication deficit: Secondary | ICD-10-CM | POA: Diagnosis not present

## 2016-06-27 DIAGNOSIS — R296 Repeated falls: Secondary | ICD-10-CM | POA: Diagnosis not present

## 2016-06-27 DIAGNOSIS — M6281 Muscle weakness (generalized): Secondary | ICD-10-CM | POA: Diagnosis not present

## 2016-06-29 LAB — URINE CULTURE

## 2016-06-30 DIAGNOSIS — G8929 Other chronic pain: Secondary | ICD-10-CM | POA: Diagnosis not present

## 2016-06-30 DIAGNOSIS — R2681 Unsteadiness on feet: Secondary | ICD-10-CM | POA: Diagnosis not present

## 2016-06-30 DIAGNOSIS — Z7984 Long term (current) use of oral hypoglycemic drugs: Secondary | ICD-10-CM | POA: Diagnosis not present

## 2016-06-30 DIAGNOSIS — F5101 Primary insomnia: Secondary | ICD-10-CM | POA: Diagnosis not present

## 2016-06-30 DIAGNOSIS — M25562 Pain in left knee: Secondary | ICD-10-CM | POA: Diagnosis not present

## 2016-06-30 DIAGNOSIS — R278 Other lack of coordination: Secondary | ICD-10-CM | POA: Diagnosis not present

## 2016-06-30 DIAGNOSIS — I129 Hypertensive chronic kidney disease with stage 1 through stage 4 chronic kidney disease, or unspecified chronic kidney disease: Secondary | ICD-10-CM | POA: Diagnosis not present

## 2016-06-30 DIAGNOSIS — R262 Difficulty in walking, not elsewhere classified: Secondary | ICD-10-CM | POA: Diagnosis not present

## 2016-06-30 DIAGNOSIS — R251 Tremor, unspecified: Secondary | ICD-10-CM | POA: Diagnosis not present

## 2016-06-30 DIAGNOSIS — M25561 Pain in right knee: Secondary | ICD-10-CM | POA: Diagnosis not present

## 2016-06-30 DIAGNOSIS — M6281 Muscle weakness (generalized): Secondary | ICD-10-CM | POA: Diagnosis not present

## 2016-06-30 DIAGNOSIS — N183 Chronic kidney disease, stage 3 (moderate): Secondary | ICD-10-CM | POA: Diagnosis not present

## 2016-06-30 DIAGNOSIS — E1121 Type 2 diabetes mellitus with diabetic nephropathy: Secondary | ICD-10-CM | POA: Diagnosis not present

## 2016-06-30 DIAGNOSIS — R41841 Cognitive communication deficit: Secondary | ICD-10-CM | POA: Diagnosis not present

## 2016-06-30 DIAGNOSIS — R296 Repeated falls: Secondary | ICD-10-CM | POA: Diagnosis not present

## 2016-06-30 DIAGNOSIS — R269 Unspecified abnormalities of gait and mobility: Secondary | ICD-10-CM | POA: Diagnosis not present

## 2016-07-08 DIAGNOSIS — M25521 Pain in right elbow: Secondary | ICD-10-CM | POA: Diagnosis not present

## 2016-07-08 DIAGNOSIS — R278 Other lack of coordination: Secondary | ICD-10-CM | POA: Diagnosis not present

## 2016-07-08 DIAGNOSIS — M6281 Muscle weakness (generalized): Secondary | ICD-10-CM | POA: Diagnosis not present

## 2016-07-08 DIAGNOSIS — R262 Difficulty in walking, not elsewhere classified: Secondary | ICD-10-CM | POA: Diagnosis not present

## 2016-07-08 DIAGNOSIS — R296 Repeated falls: Secondary | ICD-10-CM | POA: Diagnosis not present

## 2016-07-09 DIAGNOSIS — M6281 Muscle weakness (generalized): Secondary | ICD-10-CM | POA: Diagnosis not present

## 2016-07-09 DIAGNOSIS — M25521 Pain in right elbow: Secondary | ICD-10-CM | POA: Diagnosis not present

## 2016-07-09 DIAGNOSIS — R262 Difficulty in walking, not elsewhere classified: Secondary | ICD-10-CM | POA: Diagnosis not present

## 2016-07-09 DIAGNOSIS — R278 Other lack of coordination: Secondary | ICD-10-CM | POA: Diagnosis not present

## 2016-07-09 DIAGNOSIS — R296 Repeated falls: Secondary | ICD-10-CM | POA: Diagnosis not present

## 2016-07-10 DIAGNOSIS — F331 Major depressive disorder, recurrent, moderate: Secondary | ICD-10-CM | POA: Diagnosis not present

## 2016-07-11 ENCOUNTER — Emergency Department (HOSPITAL_COMMUNITY)
Admission: EM | Admit: 2016-07-11 | Discharge: 2016-07-12 | Disposition: A | Discharge status: Home / Self Care | Payer: Medicare Other | Attending: Emergency Medicine | Admitting: Emergency Medicine

## 2016-07-11 ENCOUNTER — Emergency Department (HOSPITAL_COMMUNITY): Payer: Medicare Other

## 2016-07-11 ENCOUNTER — Encounter (HOSPITAL_COMMUNITY): Payer: Self-pay

## 2016-07-11 DIAGNOSIS — E1140 Type 2 diabetes mellitus with diabetic neuropathy, unspecified: Secondary | ICD-10-CM | POA: Diagnosis not present

## 2016-07-11 DIAGNOSIS — Z7901 Long term (current) use of anticoagulants: Secondary | ICD-10-CM | POA: Diagnosis not present

## 2016-07-11 DIAGNOSIS — Y92009 Unspecified place in unspecified non-institutional (private) residence as the place of occurrence of the external cause: Secondary | ICD-10-CM | POA: Diagnosis not present

## 2016-07-11 DIAGNOSIS — Z955 Presence of coronary angioplasty implant and graft: Secondary | ICD-10-CM | POA: Diagnosis not present

## 2016-07-11 DIAGNOSIS — S42451A Displaced fracture of lateral condyle of right humerus, initial encounter for closed fracture: Secondary | ICD-10-CM

## 2016-07-11 DIAGNOSIS — R262 Difficulty in walking, not elsewhere classified: Secondary | ICD-10-CM | POA: Diagnosis not present

## 2016-07-11 DIAGNOSIS — R102 Pelvic and perineal pain: Secondary | ICD-10-CM | POA: Diagnosis not present

## 2016-07-11 DIAGNOSIS — S0003XA Contusion of scalp, initial encounter: Secondary | ICD-10-CM | POA: Diagnosis not present

## 2016-07-11 DIAGNOSIS — Y9389 Activity, other specified: Secondary | ICD-10-CM | POA: Diagnosis not present

## 2016-07-11 DIAGNOSIS — W228XXA Striking against or struck by other objects, initial encounter: Secondary | ICD-10-CM | POA: Diagnosis not present

## 2016-07-11 DIAGNOSIS — I2510 Atherosclerotic heart disease of native coronary artery without angina pectoris: Secondary | ICD-10-CM | POA: Diagnosis not present

## 2016-07-11 DIAGNOSIS — Y999 Unspecified external cause status: Secondary | ICD-10-CM | POA: Diagnosis not present

## 2016-07-11 DIAGNOSIS — R296 Repeated falls: Secondary | ICD-10-CM | POA: Diagnosis not present

## 2016-07-11 DIAGNOSIS — R0789 Other chest pain: Secondary | ICD-10-CM | POA: Diagnosis not present

## 2016-07-11 DIAGNOSIS — R278 Other lack of coordination: Secondary | ICD-10-CM | POA: Diagnosis not present

## 2016-07-11 DIAGNOSIS — M25561 Pain in right knee: Secondary | ICD-10-CM | POA: Diagnosis not present

## 2016-07-11 DIAGNOSIS — Z7984 Long term (current) use of oral hypoglycemic drugs: Secondary | ICD-10-CM | POA: Insufficient documentation

## 2016-07-11 DIAGNOSIS — M25551 Pain in right hip: Secondary | ICD-10-CM

## 2016-07-11 DIAGNOSIS — Z79899 Other long term (current) drug therapy: Secondary | ICD-10-CM | POA: Insufficient documentation

## 2016-07-11 DIAGNOSIS — S199XXA Unspecified injury of neck, initial encounter: Secondary | ICD-10-CM | POA: Diagnosis not present

## 2016-07-11 DIAGNOSIS — Z951 Presence of aortocoronary bypass graft: Secondary | ICD-10-CM | POA: Diagnosis not present

## 2016-07-11 DIAGNOSIS — I10 Essential (primary) hypertension: Secondary | ICD-10-CM | POA: Insufficient documentation

## 2016-07-11 DIAGNOSIS — S098XXA Other specified injuries of head, initial encounter: Secondary | ICD-10-CM | POA: Diagnosis not present

## 2016-07-11 DIAGNOSIS — S0190XA Unspecified open wound of unspecified part of head, initial encounter: Secondary | ICD-10-CM | POA: Diagnosis not present

## 2016-07-11 DIAGNOSIS — R51 Headache: Secondary | ICD-10-CM | POA: Diagnosis not present

## 2016-07-11 DIAGNOSIS — S42431A Displaced fracture (avulsion) of lateral epicondyle of right humerus, initial encounter for closed fracture: Secondary | ICD-10-CM | POA: Diagnosis not present

## 2016-07-11 DIAGNOSIS — W19XXXA Unspecified fall, initial encounter: Secondary | ICD-10-CM

## 2016-07-11 DIAGNOSIS — R600 Localized edema: Secondary | ICD-10-CM | POA: Diagnosis not present

## 2016-07-11 DIAGNOSIS — M25521 Pain in right elbow: Secondary | ICD-10-CM | POA: Diagnosis not present

## 2016-07-11 DIAGNOSIS — S59901A Unspecified injury of right elbow, initial encounter: Secondary | ICD-10-CM | POA: Diagnosis present

## 2016-07-11 DIAGNOSIS — M6281 Muscle weakness (generalized): Secondary | ICD-10-CM | POA: Diagnosis not present

## 2016-07-11 NOTE — ED Notes (Signed)
Pt not in room. At x-ray

## 2016-07-11 NOTE — ED Triage Notes (Signed)
Per EMS - Pt is coming from home with complaints of mechanical fall due to unsteady balance. Pt denies LOC, but reports hitting either wall or table on the way down. Family was unable to help patient up, and EMS was called. Pt has HX of falls and is currently on Eliquis. Vitals per EMS: 136/72, 76 HR, 16 RR, 99% 0/10 pain

## 2016-07-11 NOTE — ED Notes (Signed)
MD Rancour at the bedside

## 2016-07-11 NOTE — ED Notes (Signed)
Pt back in room.

## 2016-07-11 NOTE — ED Notes (Signed)
Gave PT water and crackers. No problems with fluid challenge.

## 2016-07-11 NOTE — ED Notes (Addendum)
Pt unable to ambulate. States neuropathy in lower extremities.Right hip pain. MD aware.

## 2016-07-11 NOTE — Progress Notes (Signed)
Orthopedic Tech Progress Note Patient Details:  Golden Havard [id number] [id number]  Ortho Devices Type of Ortho Device: Ace wrap, Arm sling, Post (long arm) splint Ortho Device/Splint Location: RUE Ortho Device/Splint Interventions: Ordered, Application   Braulio Bosch 07/11/2016, 10:05 PM

## 2016-07-11 NOTE — ED Notes (Signed)
Didn't make it out of the room with PT when trying to walk with walker. PT in a lot of pain and very unsteady on her feet. Spoke with MD and RN

## 2016-07-11 NOTE — ED Provider Notes (Signed)
[facility] DEPT Provider Note   CSN: [id number] Arrival date & time: 07/11/16  1845  First Provider Contact:  First MD Initiated Contact with Patient 07/11/16 [place]        History   Chief Complaint Chief Complaint  Patient presents with  . Fall    HPI Jessica King is a 82 y.o. female.  Patient from home after fall. States she was standing up to turn off the television when she lost her balance and fell striking her head, right knee and right elbow. Did not lose consciousness. She is on Eliquis for history of atrial fibrillation. Denies any chest pain or shortness of breath. Denies any preceding dizziness. States she has neuropathy and sometimes she has difficulty walking from that. Denies any focal weakness, numbness or tingling. Denies any bowel or bladder incontinence. Complains of pain to her right elbow, right knee and right hip. Also complains of a headache. No neck pain. No focal weakness   The history is provided by the patient.  Fall  Associated symptoms include headaches. Pertinent negatives include no chest pain, no abdominal pain and no shortness of breath.    Past Medical History:  Diagnosis Date  . Allergic rhinitis   . Coronary artery disease    Cardiac cath 1999, 2007, 2010, Taxus stent x 2 to LAD 2007.   . Diabetes mellitus without complication ([place])   . Diverticulosis   . Dyspnea   . Hyperlipidemia   . Hypertension   . Iron deficiency anemia   . Peripheral neuropathy (HCC)    Frequent fall  . PVD (peripheral vascular disease) ([place])    Subclavian stent May 2013 (subclavian steal)    Patient Active Problem List   Diagnosis Date Noted  . Dizziness 05/02/2015  . CAD (coronary artery disease) 05/02/2015  . PVD (peripheral vascular disease) ([place]) 05/02/2015    Past Surgical History:  Procedure Laterality Date  . BACK SURGERY     x 3 or 4 lumbar, cervical x 2  . CEA    . CORONARY ARTERY BYPASS GRAFT  2011   x 3   . Coronary Stents x3      OB  History    No data available       Home Medications    Prior to Admission medications   Medication Sig Start Date End Date Taking? Authorizing Provider  apixaban (ELIQUIS) 2.5 MG TABS tablet Take 2.5 mg by mouth 2 (two) times daily.    Historical Provider, MD  carvedilol (COREG) 3.125 MG tablet Take 3.125 mg by mouth 2 (two) times daily with a meal.    Historical Provider, MD  clonazePAM (KLONOPIN) 0.5 MG tablet Take 0.5 mg by mouth See admin instructions. Takes 1 tab in daytime as needed and 1 tab every bedtime    Historical Provider, MD  gabapentin (NEURONTIN) 300 MG capsule Take 300 mg by mouth 4 (four) times daily. Takes 1 cap three times daily and 2 caps at bedtime    Historical Provider, MD  HYDROcodone-acetaminophen (NORCO) 7.5-325 MG per tablet Take 1 tablet by mouth every 6 (six) hours.     Historical Provider, MD  lisinopril (PRINIVIL,ZESTRIL) 2.5 MG tablet Take 2.5 mg by mouth daily.    Historical Provider, MD  loratadine (CLARITIN) 10 MG tablet Take 10 mg by mouth daily.    Historical Provider, MD  metFORMIN (GLUCOPHAGE) 1000 MG tablet Take 1,000 mg by mouth 2 (two) times daily with a meal.    Historical Provider, MD  methocarbamol (  ROBAXIN) 750 MG tablet Take 750 mg by mouth 2 (two) times daily.    Historical Provider, MD  Multiple Vitamin (MULTIVITAMIN WITH MINERALS) TABS tablet Take 1 tablet by mouth daily.    Historical Provider, MD  PARoxetine (PAXIL) 20 MG tablet Take 20 mg by mouth daily.    Historical Provider, MD  simvastatin (ZOCOR) 40 MG tablet Take 40 mg by mouth daily.    Historical Provider, MD  zolpidem (AMBIEN) 5 MG tablet Take 10 mg by mouth at bedtime.    Historical Provider, MD    Family History Family History  Problem Relation Age of Onset  . Hypertension Mother   . Heart disease Mother     No details.  Died at age 95  . Diabetes Father   . Heart attack Brother 11    Social History Social History  Substance Use Topics  . Smoking status: Never  Smoker  . Smokeless tobacco: Never Used  . Alcohol use No     Allergies   Sulfa antibiotics   Review of Systems Review of Systems  Constitutional: Negative for activity change, appetite change and fatigue.  HENT: Negative for congestion and rhinorrhea.   Eyes: Negative for redness and visual disturbance.  Respiratory: Negative for cough, chest tightness and shortness of breath.   Cardiovascular: Negative for chest pain.  Gastrointestinal: Negative for abdominal pain, nausea and vomiting.  Genitourinary: Negative for dysuria and hematuria.  Musculoskeletal: Positive for arthralgias and myalgias.  Skin: Negative for rash.  Neurological: Positive for headaches. Negative for dizziness, weakness and light-headedness.  Hematological: Negative for adenopathy.   A complete 10 system review of systems was obtained and all systems are negative except as noted in the HPI and PMH.    Physical Exam Updated Vital Signs BP 162/82 (BP Location: Right Arm)   Pulse (!) 38   Temp 98.1 F (36.7 C) (Oral)   Resp 13   Ht 5\' 4"  (1.626 m)   Wt 147 lb (66.7 kg)   SpO2 98%   BMI 25.23 kg/m   Physical Exam  Constitutional: She is oriented to person, place, and time. She appears well-developed and well-nourished. No distress.  HENT:  Head: Normocephalic and atraumatic.  Mouth/Throat: Oropharynx is clear and moist. No oropharyngeal exudate.  Eyes: Conjunctivae and EOM are normal. Pupils are equal, round, and reactive to light.  Neck: Normal range of motion. Neck supple.  No C spine tenderness No meningismus.  Cardiovascular: Normal rate, normal heart sounds and intact distal pulses.   No murmur heard. Irregular rhythm  Pulmonary/Chest: Effort normal and breath sounds normal. No respiratory distress. She exhibits no tenderness.  Abdominal: Soft. There is no tenderness. There is no rebound and no guarding.  Musculoskeletal: Normal range of motion. She exhibits tenderness. She exhibits no edema.   TTP R elbow, ROM intact TTP R knee, FROM.  Intact DP and PT pulses Pelvis stable  Neurological: She is alert and oriented to person, place, and time. No cranial nerve deficit. She exhibits normal muscle tone. Coordination normal.  No ataxia on finger to nose bilaterally. No pronator drift. 5/5 strength throughout. CN 2-12 intact.Equal grip strength. Sensation intact.   Skin: Skin is warm. Capillary refill takes less than 2 seconds.  Psychiatric: She has a normal mood and affect. Her behavior is normal.  Nursing note and vitals reviewed.    ED Treatments / Results  Labs (all labs ordered are listed, but only abnormal results are displayed) Labs Reviewed - No data to  display  EKG  EKG Interpretation  Date/Time:  Friday July 11 2016 19:17:48 EDT Ventricular Rate:  71 PR Interval:    QRS Duration: 93 QT Interval:  435 QTC Calculation: 473 R Axis:   26 Text Interpretation:  Atrial flutter with predominant 3:1 AV block now atrial flutter Confirmed by Wyvonnia Dusky  MD, Madolyn Ackroyd [phone]) on 07/11/2016 7:47:10 PM       Radiology Dg Chest 2 View  Result Date: 07/11/2016 CLINICAL DATA:  82 year old female status post fall from standing with right side pain. Initial encounter. EXAM: CHEST  2 VIEW COMPARISON:  Thoracic spine series [id number]. FINDINGS: Cardiomegaly. Sequelae of CABG. Calcified aortic atherosclerosis. Lower cervical and cervicothoracic junction fusion sequelae. Bulky upper thoracic endplate osteophytosis. Diffuse osteopenia. Previously augmented lower thoracic compression fracture. Chronic appearing right proximal humerus fracture. Advanced degenerative changes at both shoulders. Chronic appearing left lateral fifth sixth and seventh rib fractures. No pneumothorax, pulmonary edema, pleural effusion or confluent pulmonary opacity. IMPRESSION: 1. Cardiomegaly. No acute cardiopulmonary abnormality. 2. Chronic degenerative, posttraumatic, and postoperative osseous findings. 3.  Calcified  aortic atherosclerosis. Electronically Signed   By: Genevie Ann M.D.   On: 07/11/2016 20:12   Dg Pelvis 1-2 Views  Result Date: 07/11/2016 CLINICAL DATA:  82 year old female status post fall from standing with right side pain. Initial encounter. EXAM: PELVIS - 1-2 VIEW COMPARISON:  None. FINDINGS: Chronic appearing right inferior pubic ramus fracture with callus. Age indeterminate lucency at the right pubic symphysis. Chronic appearing more lateral right superior pubic ramus fracture with callus. Femoral heads normally located. Grossly intact proximal femurs. The pelvis elsewhere appears intact. Bulky enthesophyte ptosis asymmetrically involving the right iliac wing and right superior trochanter. Vacuum disc with bulky endplate spurring and sclerosis in the lower lumbar spine. Negative visible bowel gas pattern. IMPRESSION: 1. Chronic appearing fractures of the right inferior and superior pubic rami with age indeterminate lucency at the right pubic symphysis. Query pubic symphysis point tenderness. 2. No other acute fracture identified about the pelvis. If there is lateralizing hip pain then dedicated hip series is recommended. 3. Pelvic and lower lumbar spine degeneration. Electronically Signed   By: Genevie Ann M.D.   On: 07/11/2016 20:10   Dg Elbow Complete Right  Result Date: 07/11/2016 CLINICAL DATA:  82 year old female status post fall from standing with right side pain. Initial encounter. EXAM: RIGHT ELBOW - COMPLETE 3+ VIEW COMPARISON:  None. FINDINGS: Suspicion of joint effusion. Joint spaces and alignment about the right elbow are preserved. The radial head appears to remain intact. However, there might be a small impaction fracture at the lateral epicondyles (image 1 arrow) versus degenerative calcifications. Proximal ulna and distal humerus appear intact. IMPRESSION: 1. Suspicion of joint effusion which might reflect hemarthrosis in this setting. 2. Possible impaction fracture at the right lateral  epicondyles. The radial head appears intact and no other acute fracture or dislocation identified about the right elbow. Electronically Signed   By: Genevie Ann M.D.   On: 07/11/2016 20:05   Ct Head Wo Contrast  Result Date: 07/11/2016 CLINICAL DATA:  Fall. EXAM: CT HEAD WITHOUT CONTRAST CT CERVICAL SPINE WITHOUT CONTRAST TECHNIQUE: Multidetector CT imaging of the head and cervical spine was performed following the standard protocol without intravenous contrast. Multiplanar CT image reconstructions of the cervical spine were also generated. COMPARISON:  06/26/2016 head and cervical spine CT. FINDINGS: CT HEAD FINDINGS Small posterior right parietal scalp contusion. No evidence of parenchymal hemorrhage or extra-axial fluid collection. No mass lesion, mass effect, or  midline shift. No CT evidence of acute infarction. Intracranial atherosclerosis. Nonspecific stable mild subcortical and periventricular white matter hypodensity, most in keeping with chronic small vessel ischemic change. Generalized cerebral volume loss. No ventriculomegaly. No fluid levels in the visualized paranasal sinuses. Mucoperiosteal thickening is seen in the bilateral ethmoidal air cells and maxillary sinuses. The mastoid air cells are unopacified. No evidence of calvarial fracture. CT CERVICAL SPINE FINDINGS No fracture is detected in the cervical spine. Stable position of odontoid screw with healed deformity in the mid odontoid. Status post ACDF C5-C7 with no evidence of hardware fracture or loosening. Status post bilateral posterior cervical thoracic spinal fusion from C5-T2 on the left and from C5-T1 on the right, with no evidence of hardware fracture or loosening. Status post C1 laminectomy. No prevertebral soft tissue swelling. There is straightening of the cervical spine, usually due to positioning and/or muscle spasm. Dens is well positioned between the lateral masses of C1. The lateral masses appear well-aligned. Moderate spondylosis  throughout the cervical and upper thoracic spine. Moderate to severe facet arthropathy throughout the cervical spine bilaterally. Mild degenerative foraminal stenosis on the right at C6-7. No cervical spine subluxation. Visualized mastoid air cells appear clear. No evidence of intra-axial hemorrhage in the visualized brain. No gross cervical canal hematoma. No significant pulmonary nodules at the visualized lung apices. No cervical adenopathy or other significant neck soft tissue abnormality. Healed deformity in the medial left clavicle. IMPRESSION: 1. Small right posterior parietal scalp contusion. No evidence of acute intracranial abnormality. No evidence of calvarial fracture. 2. Generalized cerebral volume loss and mild chronic small vessel ischemia. 3. Paranasal sinusitis, probably chronic. 4. No fracture or acute malalignment in the cervical spine. 5. No evidence of hardware complication in the dens or lower cervical spine with postsurgical changes as described. Electronically Signed   By: Ilona Sorrel M.D.   On: 07/11/2016 20:31   Ct Cervical Spine Wo Contrast  Result Date: 07/11/2016 CLINICAL DATA:  Fall. EXAM: CT HEAD WITHOUT CONTRAST CT CERVICAL SPINE WITHOUT CONTRAST TECHNIQUE: Multidetector CT imaging of the head and cervical spine was performed following the standard protocol without intravenous contrast. Multiplanar CT image reconstructions of the cervical spine were also generated. COMPARISON:  06/26/2016 head and cervical spine CT. FINDINGS: CT HEAD FINDINGS Small posterior right parietal scalp contusion. No evidence of parenchymal hemorrhage or extra-axial fluid collection. No mass lesion, mass effect, or midline shift. No CT evidence of acute infarction. Intracranial atherosclerosis. Nonspecific stable mild subcortical and periventricular white matter hypodensity, most in keeping with chronic small vessel ischemic change. Generalized cerebral volume loss. No ventriculomegaly. No fluid levels in  the visualized paranasal sinuses. Mucoperiosteal thickening is seen in the bilateral ethmoidal air cells and maxillary sinuses. The mastoid air cells are unopacified. No evidence of calvarial fracture. CT CERVICAL SPINE FINDINGS No fracture is detected in the cervical spine. Stable position of odontoid screw with healed deformity in the mid odontoid. Status post ACDF C5-C7 with no evidence of hardware fracture or loosening. Status post bilateral posterior cervical thoracic spinal fusion from C5-T2 on the left and from C5-T1 on the right, with no evidence of hardware fracture or loosening. Status post C1 laminectomy. No prevertebral soft tissue swelling. There is straightening of the cervical spine, usually due to positioning and/or muscle spasm. Dens is well positioned between the lateral masses of C1. The lateral masses appear well-aligned. Moderate spondylosis throughout the cervical and upper thoracic spine. Moderate to severe facet arthropathy throughout the cervical spine bilaterally. Mild degenerative  foraminal stenosis on the right at C6-7. No cervical spine subluxation. Visualized mastoid air cells appear clear. No evidence of intra-axial hemorrhage in the visualized brain. No gross cervical canal hematoma. No significant pulmonary nodules at the visualized lung apices. No cervical adenopathy or other significant neck soft tissue abnormality. Healed deformity in the medial left clavicle. IMPRESSION: 1. Small right posterior parietal scalp contusion. No evidence of acute intracranial abnormality. No evidence of calvarial fracture. 2. Generalized cerebral volume loss and mild chronic small vessel ischemia. 3. Paranasal sinusitis, probably chronic. 4. No fracture or acute malalignment in the cervical spine. 5. No evidence of hardware complication in the dens or lower cervical spine with postsurgical changes as described. Electronically Signed   By: Ilona Sorrel M.D.   On: 07/11/2016 20:31   Ct Pelvis Wo  Contrast  Result Date: 07/11/2016 CLINICAL DATA:  Possible acute right pubic fracture on a pelvis radiograph earlier today. Right pelvic pain following a fall from a standing position today. EXAM: CT PELVIS WITHOUT CONTRAST TECHNIQUE: Multidetector CT imaging of the pelvis was performed following the standard protocol without intravenous contrast. COMPARISON:  Pelvis radiograph obtained earlier today. FINDINGS: Old, healed right superior and inferior pubic ramus fractures and right pubic body fracture. Mild degenerative changes at the symphysis pubis. No acute fracture or dislocation is seen. Mild right and moderate left hip degenerative changes. Extensive lower lumbar spine degenerative changes and posterior laminectomy defects with bone fusion. Atheromatous arterial calcifications. These include the distal abdominal aorta. 3.6 cm right ovarian cyst. This has CT features of a simple cyst. IMPRESSION: 1. Old, healed right pubic fractures.  No acute fracture seen. 2. 3.6 cm simple appearing right ovarian cyst. This could be better characterized with pelvic ultrasound. 3. Aortic atherosclerosis. 4. Bilateral hip degenerative changes, greater on the left. 5. Lower lumbar spine degenerative and postoperative changes. Electronically Signed   By: Claudie Revering M.D.   On: 07/11/2016 21:22   Dg Knee Complete 4 Views Right  Result Date: 07/11/2016 CLINICAL DATA:  82 year old female status post fall from standing with right side pain. Initial encounter. EXAM: RIGHT KNEE - COMPLETE 4+ VIEW COMPARISON:  None. FINDINGS: Calcified peripheral vascular disease. Alignment preserved about the right knee. No joint effusion. Patella intact. Small chronic or dystrophic calcification at the lateral femoral condyles. No acute fracture or dislocation identified. IMPRESSION: No acute fracture or dislocation identified about the right knee. Calcified peripheral vascular disease. Electronically Signed   By: Genevie Ann M.D.   On: 07/11/2016  20:06    Procedures Procedures (including critical care time)  Medications Ordered in ED Medications - No data to display   Initial Impression / Assessment and Plan / ED Course  I have reviewed the triage vital signs and the nursing notes.  Pertinent labs & imaging results that were available during my care of the patient were reviewed by me and considered in my medical decision making (see chart for details).  Clinical Course  Mechanical fall on blood thinners.  C/o Head, R elbow, R knee pain.  Right elbow x-ray concerning for possible impaction fracture of proximal radius. Radial pulse intact. We'll place a long arm splint. Patient known to Dr. Berenice Primas.  X-ray results discussed with Dr. Grandville Silos. He agrees with long-arm splint for elbow fracture. Recommends CT pelvis to further evaluate pelvic fractures. These appear to be chronic and can be weightbearing as tolerated.  Family confirms new diagnosis of atrial fibrillation about 6 weeks ago by Dr. Felipa Eth and eliquis was  started then.   CT pelvis shows no acute fracture. However patient has difficulty ambulating with her walker because of right hip pain. States that the pain is not coming from her arm and she is having a lot of pain when she tries to put weight on her right leg due to pain in her hip. Concern for occult fracture we'll obtain MRI.  Care transferred to Dr. Claudine Mouton at shift change. Final Clinical Impressions(s) / ED Diagnoses   Final diagnoses:  Right hip pain  Fall, initial encounter  Closed fracture of lateral condyle of right elbow, initial encounter    New Prescriptions New Prescriptions   No medications on file     Ezequiel Essex, MD 07/12/16 0005

## 2016-07-11 NOTE — ED Notes (Signed)
MD at bedside. 

## 2016-07-12 ENCOUNTER — Emergency Department (HOSPITAL_COMMUNITY): Payer: Medicare Other

## 2016-07-12 DIAGNOSIS — S42451A Displaced fracture of lateral condyle of right humerus, initial encounter for closed fracture: Secondary | ICD-10-CM | POA: Diagnosis not present

## 2016-07-12 DIAGNOSIS — M25551 Pain in right hip: Secondary | ICD-10-CM | POA: Diagnosis not present

## 2016-07-12 DIAGNOSIS — R600 Localized edema: Secondary | ICD-10-CM | POA: Diagnosis not present

## 2016-07-12 MED ORDER — ZOLPIDEM TARTRATE 5 MG PO TABS
5.0000 mg | ORAL_TABLET | Freq: Once | ORAL | Status: AC
  Administered 2016-07-12: 5 mg via ORAL
  Filled 2016-07-12: qty 1

## 2016-07-12 MED ORDER — HYDROCODONE-ACETAMINOPHEN 7.5-325 MG PO TABS
1.0000 | ORAL_TABLET | Freq: Once | ORAL | Status: AC
  Administered 2016-07-12: 1 via ORAL
  Filled 2016-07-12: qty 1

## 2016-07-12 MED ORDER — HYDROCODONE-ACETAMINOPHEN 5-325 MG PO TABS
1.0000 | ORAL_TABLET | Freq: Once | ORAL | Status: AC
  Administered 2016-07-12: 1 via ORAL
  Filled 2016-07-12: qty 1

## 2016-07-12 MED ORDER — APIXABAN 2.5 MG PO TABS
2.5000 mg | ORAL_TABLET | Freq: Once | ORAL | Status: AC
  Administered 2016-07-12: 2.5 mg via ORAL
  Filled 2016-07-12: qty 1

## 2016-07-12 NOTE — ED Notes (Signed)
Ordered lunch tray 

## 2016-07-12 NOTE — ED Notes (Signed)
Pt family able to transport pt back to facility. Pt requesting hydrocodone prior to leaving. Lattie Haw, [place] aware.

## 2016-07-12 NOTE — ED Provider Notes (Signed)
Care assumed from Dr. Claudine Mouton at shift change.  See prior notes for full H&P.  Briefly, 82 y.o. F seen here yesterday evening after a mechanical fall while trying to turn off the TV. Found to have proximal radius fracture which has been splinted, hand surgery aware.  Also has known chronic fractures of pelvis, followed by Dr. Berenice Primas.  CT was obtained and negative for acute fracture, however patient still with difficulty walking so MRI obtained which is also negative for fracture but does show some likely muscle strain.  Patient still with difficulty ambulating.  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  Daughter present at bedside, would like home assistance.  Plan:  SW consult pending.  10:20 AM No social work available at this time in ED.  Case management has evaluated patient-- discussing with SW on floor.  Patient is currently at assisted living facility, daughter is there now trying to upgrade to more acute care.    12:47 PM Case management has spoken with daughter and facility-- will be able to update her to more acute care in the facility.  Family also plans to take turns watching her over the next week or so.  They are comfortable with discharge back to facility at this time.  Will have her follow-up with her primary care doctor.  Discussed plan with patient and daughter, they acknowledged understanding and agreed with plan of care.  Return precautions given for new or worsening symptoms.   Larene Pickett, PA-C 07/12/16 1449    Isla Pence, MD 07/12/16 [phone]

## 2016-07-12 NOTE — ED Provider Notes (Addendum)
Patient signed out to me as pending MRI to eval for fracture.  MRI is negative for fracture and only reveals muscular injury.  Patient still can not walk however, likely secondary to pain.  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  Daughter also can not take her home.  Will hold for social work consult.  Patient given home dose of eliquis and lortab.  Patient signed out to Quin[facility] Carnes for dispo.     Everlene Balls, MD 07/12/16 [phone]

## 2016-07-12 NOTE — Care Management Note (Signed)
Case Management Note  Patient Details  Name: Jessica King MRN: [id number] Date of Birth: 1932-07-17  Subjective/Objective:     Daughter confirms pts assessment/Plans. Sons and daughter are going to assist parents over next few weeks along with increased Private Caregiver hours to get through this difficult time. Aware that now is the time to begin planning for Increased Level of care for parents.                Action/Plan:CM will sign off as there is a safe discharge plan in place for this pleasant 82 yo F.   Expected Discharge Date:                  Expected Discharge Plan:     In-House Referral:     Discharge planning Services     Post Acute Care Choice:    Choice offered to:     DME Arranged:    DME Agency:     HH Arranged:    HH Agency:     Status of Service:     If discussed at [employer], dates discussed:    Additional Comments:  Delrae Sawyers, RN 07/12/2016, 12:50 PM

## 2016-07-12 NOTE — Discharge Instructions (Signed)
As discussed, your MRI did show some muscle tears around your hip. Use caution when walking to avoid further falls. Follow-up with your primary care doctor.

## 2016-07-12 NOTE — Care Management Note (Signed)
Case Management Note  Patient Details  Name: Jessica King MRN: [id number] Date of Birth: July 24, 1932  Subjective/Objective:    82 y.o. F seen in the ED after a fall who does not have any reason to be admitted. Pt tells me that she has had dozens of falls due to her peripheral neuropathy. Last seen in the ED 06/26/2016 after fall. 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 They both reside in [place] and are able to hire private Caregivers. Daughter, Delayla Deskins,  lives here in [place] and is currently making arrangements toward that end.                    Action/Plan: Awaiting daughter to return with safe arrangements.   Expected Discharge Date:                  Expected Discharge Plan:     In-House Referral:     Discharge planning Services     Post Acute Care Choice:    Choice offered to:     DME Arranged:    DME Agency:     HH Arranged:    HH Agency:     Status of Service:     If discussed at [employer], dates discussed:    Additional Comments:  Delrae Sawyers, RN 07/12/2016, 10:39 AM

## 2016-07-14 DIAGNOSIS — M25521 Pain in right elbow: Secondary | ICD-10-CM | POA: Diagnosis not present

## 2016-07-14 DIAGNOSIS — M25551 Pain in right hip: Secondary | ICD-10-CM | POA: Diagnosis not present

## 2016-07-16 DIAGNOSIS — R278 Other lack of coordination: Secondary | ICD-10-CM | POA: Diagnosis not present

## 2016-07-16 DIAGNOSIS — M6281 Muscle weakness (generalized): Secondary | ICD-10-CM | POA: Diagnosis not present

## 2016-07-16 DIAGNOSIS — R296 Repeated falls: Secondary | ICD-10-CM | POA: Diagnosis not present

## 2016-07-16 DIAGNOSIS — M25521 Pain in right elbow: Secondary | ICD-10-CM | POA: Diagnosis not present

## 2016-07-16 DIAGNOSIS — R262 Difficulty in walking, not elsewhere classified: Secondary | ICD-10-CM | POA: Diagnosis not present

## 2016-07-17 DIAGNOSIS — R296 Repeated falls: Secondary | ICD-10-CM | POA: Diagnosis not present

## 2016-07-17 DIAGNOSIS — M6281 Muscle weakness (generalized): Secondary | ICD-10-CM | POA: Diagnosis not present

## 2016-07-17 DIAGNOSIS — R262 Difficulty in walking, not elsewhere classified: Secondary | ICD-10-CM | POA: Diagnosis not present

## 2016-07-17 DIAGNOSIS — R278 Other lack of coordination: Secondary | ICD-10-CM | POA: Diagnosis not present

## 2016-07-17 DIAGNOSIS — M25521 Pain in right elbow: Secondary | ICD-10-CM | POA: Diagnosis not present

## 2016-07-18 DIAGNOSIS — R262 Difficulty in walking, not elsewhere classified: Secondary | ICD-10-CM | POA: Diagnosis not present

## 2016-07-18 DIAGNOSIS — M6281 Muscle weakness (generalized): Secondary | ICD-10-CM | POA: Diagnosis not present

## 2016-07-18 DIAGNOSIS — R278 Other lack of coordination: Secondary | ICD-10-CM | POA: Diagnosis not present

## 2016-07-18 DIAGNOSIS — M25521 Pain in right elbow: Secondary | ICD-10-CM | POA: Diagnosis not present

## 2016-07-18 DIAGNOSIS — R296 Repeated falls: Secondary | ICD-10-CM | POA: Diagnosis not present

## 2016-07-21 DIAGNOSIS — R278 Other lack of coordination: Secondary | ICD-10-CM | POA: Diagnosis not present

## 2016-07-21 DIAGNOSIS — R296 Repeated falls: Secondary | ICD-10-CM | POA: Diagnosis not present

## 2016-07-21 DIAGNOSIS — R262 Difficulty in walking, not elsewhere classified: Secondary | ICD-10-CM | POA: Diagnosis not present

## 2016-07-21 DIAGNOSIS — M25521 Pain in right elbow: Secondary | ICD-10-CM | POA: Diagnosis not present

## 2016-07-21 DIAGNOSIS — M6281 Muscle weakness (generalized): Secondary | ICD-10-CM | POA: Diagnosis not present

## 2016-07-22 DIAGNOSIS — R262 Difficulty in walking, not elsewhere classified: Secondary | ICD-10-CM | POA: Diagnosis not present

## 2016-07-22 DIAGNOSIS — R296 Repeated falls: Secondary | ICD-10-CM | POA: Diagnosis not present

## 2016-07-22 DIAGNOSIS — R278 Other lack of coordination: Secondary | ICD-10-CM | POA: Diagnosis not present

## 2016-07-22 DIAGNOSIS — M25521 Pain in right elbow: Secondary | ICD-10-CM | POA: Diagnosis not present

## 2016-07-22 DIAGNOSIS — M6281 Muscle weakness (generalized): Secondary | ICD-10-CM | POA: Diagnosis not present

## 2016-07-23 DIAGNOSIS — R296 Repeated falls: Secondary | ICD-10-CM | POA: Diagnosis not present

## 2016-07-23 DIAGNOSIS — R278 Other lack of coordination: Secondary | ICD-10-CM | POA: Diagnosis not present

## 2016-07-23 DIAGNOSIS — M6281 Muscle weakness (generalized): Secondary | ICD-10-CM | POA: Diagnosis not present

## 2016-07-23 DIAGNOSIS — M25521 Pain in right elbow: Secondary | ICD-10-CM | POA: Diagnosis not present

## 2016-07-23 DIAGNOSIS — R262 Difficulty in walking, not elsewhere classified: Secondary | ICD-10-CM | POA: Diagnosis not present

## 2016-07-24 DIAGNOSIS — M6281 Muscle weakness (generalized): Secondary | ICD-10-CM | POA: Diagnosis not present

## 2016-07-24 DIAGNOSIS — R278 Other lack of coordination: Secondary | ICD-10-CM | POA: Diagnosis not present

## 2016-07-24 DIAGNOSIS — R296 Repeated falls: Secondary | ICD-10-CM | POA: Diagnosis not present

## 2016-07-24 DIAGNOSIS — M25521 Pain in right elbow: Secondary | ICD-10-CM | POA: Diagnosis not present

## 2016-07-24 DIAGNOSIS — R262 Difficulty in walking, not elsewhere classified: Secondary | ICD-10-CM | POA: Diagnosis not present

## 2016-07-25 DIAGNOSIS — R262 Difficulty in walking, not elsewhere classified: Secondary | ICD-10-CM | POA: Diagnosis not present

## 2016-07-25 DIAGNOSIS — R296 Repeated falls: Secondary | ICD-10-CM | POA: Diagnosis not present

## 2016-07-25 DIAGNOSIS — M6281 Muscle weakness (generalized): Secondary | ICD-10-CM | POA: Diagnosis not present

## 2016-07-25 DIAGNOSIS — M25521 Pain in right elbow: Secondary | ICD-10-CM | POA: Diagnosis not present

## 2016-07-25 DIAGNOSIS — R278 Other lack of coordination: Secondary | ICD-10-CM | POA: Diagnosis not present

## 2016-07-25 DIAGNOSIS — F331 Major depressive disorder, recurrent, moderate: Secondary | ICD-10-CM | POA: Diagnosis not present

## 2016-07-28 DIAGNOSIS — R296 Repeated falls: Secondary | ICD-10-CM | POA: Diagnosis not present

## 2016-07-28 DIAGNOSIS — M6281 Muscle weakness (generalized): Secondary | ICD-10-CM | POA: Diagnosis not present

## 2016-07-28 DIAGNOSIS — R278 Other lack of coordination: Secondary | ICD-10-CM | POA: Diagnosis not present

## 2016-07-28 DIAGNOSIS — M25521 Pain in right elbow: Secondary | ICD-10-CM | POA: Diagnosis not present

## 2016-07-28 DIAGNOSIS — R262 Difficulty in walking, not elsewhere classified: Secondary | ICD-10-CM | POA: Diagnosis not present

## 2016-07-29 DIAGNOSIS — M25521 Pain in right elbow: Secondary | ICD-10-CM | POA: Diagnosis not present

## 2016-07-29 DIAGNOSIS — M6281 Muscle weakness (generalized): Secondary | ICD-10-CM | POA: Diagnosis not present

## 2016-07-29 DIAGNOSIS — R296 Repeated falls: Secondary | ICD-10-CM | POA: Diagnosis not present

## 2016-07-29 DIAGNOSIS — R2689 Other abnormalities of gait and mobility: Secondary | ICD-10-CM | POA: Diagnosis not present

## 2016-07-30 DIAGNOSIS — R2689 Other abnormalities of gait and mobility: Secondary | ICD-10-CM | POA: Diagnosis not present

## 2016-07-30 DIAGNOSIS — M25521 Pain in right elbow: Secondary | ICD-10-CM | POA: Diagnosis not present

## 2016-07-30 DIAGNOSIS — M6281 Muscle weakness (generalized): Secondary | ICD-10-CM | POA: Diagnosis not present

## 2016-07-30 DIAGNOSIS — R296 Repeated falls: Secondary | ICD-10-CM | POA: Diagnosis not present

## 2016-07-31 DIAGNOSIS — M6281 Muscle weakness (generalized): Secondary | ICD-10-CM | POA: Diagnosis not present

## 2016-07-31 DIAGNOSIS — R2689 Other abnormalities of gait and mobility: Secondary | ICD-10-CM | POA: Diagnosis not present

## 2016-07-31 DIAGNOSIS — R296 Repeated falls: Secondary | ICD-10-CM | POA: Diagnosis not present

## 2016-07-31 DIAGNOSIS — M25521 Pain in right elbow: Secondary | ICD-10-CM | POA: Diagnosis not present

## 2016-08-01 DIAGNOSIS — M6281 Muscle weakness (generalized): Secondary | ICD-10-CM | POA: Diagnosis not present

## 2016-08-01 DIAGNOSIS — M25521 Pain in right elbow: Secondary | ICD-10-CM | POA: Diagnosis not present

## 2016-08-01 DIAGNOSIS — R296 Repeated falls: Secondary | ICD-10-CM | POA: Diagnosis not present

## 2016-08-01 DIAGNOSIS — R2689 Other abnormalities of gait and mobility: Secondary | ICD-10-CM | POA: Diagnosis not present

## 2016-08-05 DIAGNOSIS — R2689 Other abnormalities of gait and mobility: Secondary | ICD-10-CM | POA: Diagnosis not present

## 2016-08-05 DIAGNOSIS — R296 Repeated falls: Secondary | ICD-10-CM | POA: Diagnosis not present

## 2016-08-05 DIAGNOSIS — M25521 Pain in right elbow: Secondary | ICD-10-CM | POA: Diagnosis not present

## 2016-08-05 DIAGNOSIS — M6281 Muscle weakness (generalized): Secondary | ICD-10-CM | POA: Diagnosis not present

## 2016-08-06 DIAGNOSIS — M6281 Muscle weakness (generalized): Secondary | ICD-10-CM | POA: Diagnosis not present

## 2016-08-06 DIAGNOSIS — M545 Low back pain: Secondary | ICD-10-CM | POA: Diagnosis not present

## 2016-08-06 DIAGNOSIS — R2689 Other abnormalities of gait and mobility: Secondary | ICD-10-CM | POA: Diagnosis not present

## 2016-08-06 DIAGNOSIS — R296 Repeated falls: Secondary | ICD-10-CM | POA: Diagnosis not present

## 2016-08-06 DIAGNOSIS — M25521 Pain in right elbow: Secondary | ICD-10-CM | POA: Diagnosis not present

## 2016-08-07 DIAGNOSIS — M6281 Muscle weakness (generalized): Secondary | ICD-10-CM | POA: Diagnosis not present

## 2016-08-07 DIAGNOSIS — R296 Repeated falls: Secondary | ICD-10-CM | POA: Diagnosis not present

## 2016-08-07 DIAGNOSIS — M25521 Pain in right elbow: Secondary | ICD-10-CM | POA: Diagnosis not present

## 2016-08-07 DIAGNOSIS — R2689 Other abnormalities of gait and mobility: Secondary | ICD-10-CM | POA: Diagnosis not present

## 2016-08-08 DIAGNOSIS — R2689 Other abnormalities of gait and mobility: Secondary | ICD-10-CM | POA: Diagnosis not present

## 2016-08-08 DIAGNOSIS — R296 Repeated falls: Secondary | ICD-10-CM | POA: Diagnosis not present

## 2016-08-08 DIAGNOSIS — M6281 Muscle weakness (generalized): Secondary | ICD-10-CM | POA: Diagnosis not present

## 2016-08-08 DIAGNOSIS — M25521 Pain in right elbow: Secondary | ICD-10-CM | POA: Diagnosis not present

## 2016-08-11 DIAGNOSIS — R2689 Other abnormalities of gait and mobility: Secondary | ICD-10-CM | POA: Diagnosis not present

## 2016-08-11 DIAGNOSIS — R296 Repeated falls: Secondary | ICD-10-CM | POA: Diagnosis not present

## 2016-08-11 DIAGNOSIS — Z111 Encounter for screening for respiratory tuberculosis: Secondary | ICD-10-CM | POA: Diagnosis not present

## 2016-08-11 DIAGNOSIS — M6281 Muscle weakness (generalized): Secondary | ICD-10-CM | POA: Diagnosis not present

## 2016-08-11 DIAGNOSIS — M25521 Pain in right elbow: Secondary | ICD-10-CM | POA: Diagnosis not present

## 2016-08-12 DIAGNOSIS — M25521 Pain in right elbow: Secondary | ICD-10-CM | POA: Diagnosis not present

## 2016-08-12 DIAGNOSIS — M6281 Muscle weakness (generalized): Secondary | ICD-10-CM | POA: Diagnosis not present

## 2016-08-12 DIAGNOSIS — R296 Repeated falls: Secondary | ICD-10-CM | POA: Diagnosis not present

## 2016-08-12 DIAGNOSIS — R2689 Other abnormalities of gait and mobility: Secondary | ICD-10-CM | POA: Diagnosis not present

## 2016-08-14 DIAGNOSIS — F331 Major depressive disorder, recurrent, moderate: Secondary | ICD-10-CM | POA: Diagnosis not present

## 2016-08-14 DIAGNOSIS — M6281 Muscle weakness (generalized): Secondary | ICD-10-CM | POA: Diagnosis not present

## 2016-08-14 DIAGNOSIS — R2689 Other abnormalities of gait and mobility: Secondary | ICD-10-CM | POA: Diagnosis not present

## 2016-08-14 DIAGNOSIS — M25521 Pain in right elbow: Secondary | ICD-10-CM | POA: Diagnosis not present

## 2016-08-14 DIAGNOSIS — R296 Repeated falls: Secondary | ICD-10-CM | POA: Diagnosis not present

## 2016-08-18 DIAGNOSIS — R296 Repeated falls: Secondary | ICD-10-CM | POA: Diagnosis not present

## 2016-08-18 DIAGNOSIS — M6281 Muscle weakness (generalized): Secondary | ICD-10-CM | POA: Diagnosis not present

## 2016-08-18 DIAGNOSIS — R2689 Other abnormalities of gait and mobility: Secondary | ICD-10-CM | POA: Diagnosis not present

## 2016-08-18 DIAGNOSIS — M25521 Pain in right elbow: Secondary | ICD-10-CM | POA: Diagnosis not present

## 2016-08-19 DIAGNOSIS — R2689 Other abnormalities of gait and mobility: Secondary | ICD-10-CM | POA: Diagnosis not present

## 2016-08-19 DIAGNOSIS — M6281 Muscle weakness (generalized): Secondary | ICD-10-CM | POA: Diagnosis not present

## 2016-08-19 DIAGNOSIS — R296 Repeated falls: Secondary | ICD-10-CM | POA: Diagnosis not present

## 2016-08-19 DIAGNOSIS — M25521 Pain in right elbow: Secondary | ICD-10-CM | POA: Diagnosis not present

## 2016-08-19 DIAGNOSIS — F331 Major depressive disorder, recurrent, moderate: Secondary | ICD-10-CM | POA: Diagnosis not present

## 2016-08-20 DIAGNOSIS — M6281 Muscle weakness (generalized): Secondary | ICD-10-CM | POA: Diagnosis not present

## 2016-08-20 DIAGNOSIS — M25521 Pain in right elbow: Secondary | ICD-10-CM | POA: Diagnosis not present

## 2016-08-20 DIAGNOSIS — R2689 Other abnormalities of gait and mobility: Secondary | ICD-10-CM | POA: Diagnosis not present

## 2016-08-20 DIAGNOSIS — R296 Repeated falls: Secondary | ICD-10-CM | POA: Diagnosis not present

## 2016-08-21 DIAGNOSIS — R296 Repeated falls: Secondary | ICD-10-CM | POA: Diagnosis not present

## 2016-08-21 DIAGNOSIS — M25521 Pain in right elbow: Secondary | ICD-10-CM | POA: Diagnosis not present

## 2016-08-21 DIAGNOSIS — M6281 Muscle weakness (generalized): Secondary | ICD-10-CM | POA: Diagnosis not present

## 2016-08-21 DIAGNOSIS — R2689 Other abnormalities of gait and mobility: Secondary | ICD-10-CM | POA: Diagnosis not present

## 2016-08-22 DIAGNOSIS — R2689 Other abnormalities of gait and mobility: Secondary | ICD-10-CM | POA: Diagnosis not present

## 2016-08-22 DIAGNOSIS — M25521 Pain in right elbow: Secondary | ICD-10-CM | POA: Diagnosis not present

## 2016-08-22 DIAGNOSIS — R296 Repeated falls: Secondary | ICD-10-CM | POA: Diagnosis not present

## 2016-08-22 DIAGNOSIS — M6281 Muscle weakness (generalized): Secondary | ICD-10-CM | POA: Diagnosis not present

## 2016-08-25 DIAGNOSIS — R296 Repeated falls: Secondary | ICD-10-CM | POA: Diagnosis not present

## 2016-08-25 DIAGNOSIS — M25521 Pain in right elbow: Secondary | ICD-10-CM | POA: Diagnosis not present

## 2016-08-25 DIAGNOSIS — M6281 Muscle weakness (generalized): Secondary | ICD-10-CM | POA: Diagnosis not present

## 2016-08-25 DIAGNOSIS — R2689 Other abnormalities of gait and mobility: Secondary | ICD-10-CM | POA: Diagnosis not present

## 2016-08-26 DIAGNOSIS — R2689 Other abnormalities of gait and mobility: Secondary | ICD-10-CM | POA: Diagnosis not present

## 2016-08-26 DIAGNOSIS — R296 Repeated falls: Secondary | ICD-10-CM | POA: Diagnosis not present

## 2016-08-26 DIAGNOSIS — M6281 Muscle weakness (generalized): Secondary | ICD-10-CM | POA: Diagnosis not present

## 2016-08-26 DIAGNOSIS — M25521 Pain in right elbow: Secondary | ICD-10-CM | POA: Diagnosis not present

## 2016-08-28 DIAGNOSIS — R296 Repeated falls: Secondary | ICD-10-CM | POA: Diagnosis not present

## 2016-08-28 DIAGNOSIS — M25521 Pain in right elbow: Secondary | ICD-10-CM | POA: Diagnosis not present

## 2016-08-28 DIAGNOSIS — M6281 Muscle weakness (generalized): Secondary | ICD-10-CM | POA: Diagnosis not present

## 2016-08-28 DIAGNOSIS — R2689 Other abnormalities of gait and mobility: Secondary | ICD-10-CM | POA: Diagnosis not present

## 2016-08-29 DIAGNOSIS — M6281 Muscle weakness (generalized): Secondary | ICD-10-CM | POA: Diagnosis not present

## 2016-08-29 DIAGNOSIS — M25521 Pain in right elbow: Secondary | ICD-10-CM | POA: Diagnosis not present

## 2016-08-29 DIAGNOSIS — R2689 Other abnormalities of gait and mobility: Secondary | ICD-10-CM | POA: Diagnosis not present

## 2016-08-29 DIAGNOSIS — R296 Repeated falls: Secondary | ICD-10-CM | POA: Diagnosis not present

## 2016-09-01 DIAGNOSIS — M25521 Pain in right elbow: Secondary | ICD-10-CM | POA: Diagnosis not present

## 2016-09-01 DIAGNOSIS — R296 Repeated falls: Secondary | ICD-10-CM | POA: Diagnosis not present

## 2016-09-01 DIAGNOSIS — M6281 Muscle weakness (generalized): Secondary | ICD-10-CM | POA: Diagnosis not present

## 2016-09-01 DIAGNOSIS — R2689 Other abnormalities of gait and mobility: Secondary | ICD-10-CM | POA: Diagnosis not present

## 2016-09-02 DIAGNOSIS — R296 Repeated falls: Secondary | ICD-10-CM | POA: Diagnosis not present

## 2016-09-02 DIAGNOSIS — R2689 Other abnormalities of gait and mobility: Secondary | ICD-10-CM | POA: Diagnosis not present

## 2016-09-02 DIAGNOSIS — M25521 Pain in right elbow: Secondary | ICD-10-CM | POA: Diagnosis not present

## 2016-09-02 DIAGNOSIS — M6281 Muscle weakness (generalized): Secondary | ICD-10-CM | POA: Diagnosis not present

## 2016-09-03 DIAGNOSIS — R296 Repeated falls: Secondary | ICD-10-CM | POA: Diagnosis not present

## 2016-09-03 DIAGNOSIS — M6281 Muscle weakness (generalized): Secondary | ICD-10-CM | POA: Diagnosis not present

## 2016-09-03 DIAGNOSIS — M25521 Pain in right elbow: Secondary | ICD-10-CM | POA: Diagnosis not present

## 2016-09-03 DIAGNOSIS — M545 Low back pain: Secondary | ICD-10-CM | POA: Diagnosis not present

## 2016-09-03 DIAGNOSIS — R2689 Other abnormalities of gait and mobility: Secondary | ICD-10-CM | POA: Diagnosis not present

## 2016-09-04 DIAGNOSIS — M6281 Muscle weakness (generalized): Secondary | ICD-10-CM | POA: Diagnosis not present

## 2016-09-04 DIAGNOSIS — R296 Repeated falls: Secondary | ICD-10-CM | POA: Diagnosis not present

## 2016-09-04 DIAGNOSIS — R2689 Other abnormalities of gait and mobility: Secondary | ICD-10-CM | POA: Diagnosis not present

## 2016-09-04 DIAGNOSIS — M25521 Pain in right elbow: Secondary | ICD-10-CM | POA: Diagnosis not present

## 2016-09-08 DIAGNOSIS — R296 Repeated falls: Secondary | ICD-10-CM | POA: Diagnosis not present

## 2016-09-08 DIAGNOSIS — M25521 Pain in right elbow: Secondary | ICD-10-CM | POA: Diagnosis not present

## 2016-09-08 DIAGNOSIS — R2689 Other abnormalities of gait and mobility: Secondary | ICD-10-CM | POA: Diagnosis not present

## 2016-09-08 DIAGNOSIS — M6281 Muscle weakness (generalized): Secondary | ICD-10-CM | POA: Diagnosis not present

## 2016-09-09 DIAGNOSIS — M25521 Pain in right elbow: Secondary | ICD-10-CM | POA: Diagnosis not present

## 2016-09-09 DIAGNOSIS — R296 Repeated falls: Secondary | ICD-10-CM | POA: Diagnosis not present

## 2016-09-09 DIAGNOSIS — M6281 Muscle weakness (generalized): Secondary | ICD-10-CM | POA: Diagnosis not present

## 2016-09-09 DIAGNOSIS — R2689 Other abnormalities of gait and mobility: Secondary | ICD-10-CM | POA: Diagnosis not present

## 2016-09-10 DIAGNOSIS — M6281 Muscle weakness (generalized): Secondary | ICD-10-CM | POA: Diagnosis not present

## 2016-09-10 DIAGNOSIS — R296 Repeated falls: Secondary | ICD-10-CM | POA: Diagnosis not present

## 2016-09-10 DIAGNOSIS — M25521 Pain in right elbow: Secondary | ICD-10-CM | POA: Diagnosis not present

## 2016-09-10 DIAGNOSIS — R2689 Other abnormalities of gait and mobility: Secondary | ICD-10-CM | POA: Diagnosis not present

## 2016-09-15 DIAGNOSIS — R296 Repeated falls: Secondary | ICD-10-CM | POA: Diagnosis not present

## 2016-09-15 DIAGNOSIS — R2689 Other abnormalities of gait and mobility: Secondary | ICD-10-CM | POA: Diagnosis not present

## 2016-09-15 DIAGNOSIS — M25521 Pain in right elbow: Secondary | ICD-10-CM | POA: Diagnosis not present

## 2016-09-15 DIAGNOSIS — M6281 Muscle weakness (generalized): Secondary | ICD-10-CM | POA: Diagnosis not present

## 2016-09-17 DIAGNOSIS — R2689 Other abnormalities of gait and mobility: Secondary | ICD-10-CM | POA: Diagnosis not present

## 2016-09-17 DIAGNOSIS — M6281 Muscle weakness (generalized): Secondary | ICD-10-CM | POA: Diagnosis not present

## 2016-09-17 DIAGNOSIS — M25521 Pain in right elbow: Secondary | ICD-10-CM | POA: Diagnosis not present

## 2016-09-17 DIAGNOSIS — R296 Repeated falls: Secondary | ICD-10-CM | POA: Diagnosis not present

## 2016-09-18 DIAGNOSIS — R2689 Other abnormalities of gait and mobility: Secondary | ICD-10-CM | POA: Diagnosis not present

## 2016-09-18 DIAGNOSIS — M25521 Pain in right elbow: Secondary | ICD-10-CM | POA: Diagnosis not present

## 2016-09-18 DIAGNOSIS — R296 Repeated falls: Secondary | ICD-10-CM | POA: Diagnosis not present

## 2016-09-18 DIAGNOSIS — M6281 Muscle weakness (generalized): Secondary | ICD-10-CM | POA: Diagnosis not present

## 2016-09-21 ENCOUNTER — Observation Stay (HOSPITAL_COMMUNITY)
Admission: EM | Admit: 2016-09-21 | Discharge: 2016-09-23 | Disposition: A | Discharge status: Home Health | Payer: Medicare Other | Attending: Internal Medicine | Admitting: Internal Medicine

## 2016-09-21 ENCOUNTER — Emergency Department (HOSPITAL_COMMUNITY): Payer: Medicare Other

## 2016-09-21 ENCOUNTER — Encounter (HOSPITAL_COMMUNITY): Payer: Self-pay | Admitting: Emergency Medicine

## 2016-09-21 DIAGNOSIS — E785 Hyperlipidemia, unspecified: Secondary | ICD-10-CM | POA: Diagnosis not present

## 2016-09-21 DIAGNOSIS — W19XXXA Unspecified fall, initial encounter: Secondary | ICD-10-CM | POA: Diagnosis present

## 2016-09-21 DIAGNOSIS — I10 Essential (primary) hypertension: Secondary | ICD-10-CM | POA: Diagnosis present

## 2016-09-21 DIAGNOSIS — R55 Syncope and collapse: Principal | ICD-10-CM | POA: Diagnosis present

## 2016-09-21 DIAGNOSIS — I5022 Chronic systolic (congestive) heart failure: Secondary | ICD-10-CM | POA: Diagnosis not present

## 2016-09-21 DIAGNOSIS — I2510 Atherosclerotic heart disease of native coronary artery without angina pectoris: Secondary | ICD-10-CM | POA: Diagnosis present

## 2016-09-21 DIAGNOSIS — Z7901 Long term (current) use of anticoagulants: Secondary | ICD-10-CM | POA: Diagnosis not present

## 2016-09-21 DIAGNOSIS — I4892 Unspecified atrial flutter: Secondary | ICD-10-CM | POA: Diagnosis not present

## 2016-09-21 DIAGNOSIS — E1142 Type 2 diabetes mellitus with diabetic polyneuropathy: Secondary | ICD-10-CM | POA: Diagnosis not present

## 2016-09-21 DIAGNOSIS — S79911A Unspecified injury of right hip, initial encounter: Secondary | ICD-10-CM | POA: Diagnosis not present

## 2016-09-21 DIAGNOSIS — S0003XA Contusion of scalp, initial encounter: Secondary | ICD-10-CM | POA: Diagnosis not present

## 2016-09-21 DIAGNOSIS — E875 Hyperkalemia: Secondary | ICD-10-CM | POA: Diagnosis present

## 2016-09-21 DIAGNOSIS — E119 Type 2 diabetes mellitus without complications: Secondary | ICD-10-CM

## 2016-09-21 DIAGNOSIS — D4819 Other specified neoplasm of uncertain behavior of connective and other soft tissue: Secondary | ICD-10-CM

## 2016-09-21 DIAGNOSIS — I110 Hypertensive heart disease with heart failure: Secondary | ICD-10-CM | POA: Diagnosis not present

## 2016-09-21 DIAGNOSIS — S0101XA Laceration without foreign body of scalp, initial encounter: Secondary | ICD-10-CM | POA: Diagnosis present

## 2016-09-21 DIAGNOSIS — Z951 Presence of aortocoronary bypass graft: Secondary | ICD-10-CM | POA: Diagnosis not present

## 2016-09-21 DIAGNOSIS — D481 Neoplasm of uncertain behavior of connective and other soft tissue: Secondary | ICD-10-CM

## 2016-09-21 DIAGNOSIS — E1151 Type 2 diabetes mellitus with diabetic peripheral angiopathy without gangrene: Secondary | ICD-10-CM | POA: Insufficient documentation

## 2016-09-21 DIAGNOSIS — R296 Repeated falls: Secondary | ICD-10-CM | POA: Insufficient documentation

## 2016-09-21 DIAGNOSIS — I4891 Unspecified atrial fibrillation: Secondary | ICD-10-CM | POA: Diagnosis not present

## 2016-09-21 DIAGNOSIS — M25551 Pain in right hip: Secondary | ICD-10-CM | POA: Diagnosis not present

## 2016-09-21 DIAGNOSIS — M542 Cervicalgia: Secondary | ICD-10-CM | POA: Diagnosis not present

## 2016-09-21 DIAGNOSIS — S098XXA Other specified injuries of head, initial encounter: Secondary | ICD-10-CM | POA: Diagnosis not present

## 2016-09-21 DIAGNOSIS — S0181XA Laceration without foreign body of other part of head, initial encounter: Secondary | ICD-10-CM | POA: Diagnosis not present

## 2016-09-21 DIAGNOSIS — R937 Abnormal findings on diagnostic imaging of other parts of musculoskeletal system: Secondary | ICD-10-CM | POA: Diagnosis not present

## 2016-09-21 HISTORY — DX: Chronic systolic (congestive) heart failure: I50.22

## 2016-09-21 HISTORY — DX: Neoplasm of uncertain behavior of connective and other soft tissue: D48.1

## 2016-09-21 HISTORY — DX: Atherosclerotic heart disease of native coronary artery without angina pectoris: I25.10

## 2016-09-21 MED ORDER — LIDOCAINE HCL (PF) 1 % IJ SOLN
5.0000 mL | Freq: Once | INTRAMUSCULAR | Status: AC
  Administered 2016-09-22: 5 mL via INTRADERMAL
  Filled 2016-09-21: qty 5

## 2016-09-21 MED ORDER — SODIUM CHLORIDE 0.9 % IV BOLUS (SEPSIS)
500.0000 mL | Freq: Once | INTRAVENOUS | Status: AC
  Administered 2016-09-22: 500 mL via INTRAVENOUS

## 2016-09-21 NOTE — ED Triage Notes (Signed)
Pt presents from [facility].  Per EMS she was getting ready for a shower and fell on the mat.  No LOC, no dizziness, no nausea, A & O x 4.  Full recall per EMS.  Small lac left temple.  Pt is on blood thinners. Bleeding controlled. Upon triage pt states that she "does not remember this fall".  Thinks she may have "blacked out".  States she thinks she has fallen approximately 3 times in the last 3 weeks.

## 2016-09-21 NOTE — ED Provider Notes (Signed)
[name] DEPT Provider Note   CSN: [id number] Arrival date & time: 09/21/16  2140     History   Chief Complaint Chief Complaint  Patient presents with  . Fall  . Head Laceration    HPI Jessica King is a 82 y.o. female with history of coronary artery disease.  Patient presents from [facility] color diabetes, hypertension, neuropathy, vascular disease, on eliquis, presents after unwitnessed fall and head injury. Patient was getting a fish hour and fell on the mat. Possible loss of consciousness. No nausea vomiting or chest pain. Left shoulder and left head pain. Mild bleeding. Patient does not remember details of the fall. Patient has had a few falls the past 3 weeks.      Past Medical History:  Diagnosis Date  . Abdominal fibromatosis   . Allergic rhinitis   . CAD (coronary artery disease)   . Chronic systolic congestive heart failure ([place])   . Coronary artery disease    Cardiac cath 1999, 2007, 2010, Taxus stent x 2 to LAD 2007.   . Diabetes mellitus without complication ([place])   . Diverticulosis   . Dyspnea   . Hyperlipidemia   . Hypertension   . Iron deficiency anemia   . Peripheral neuropathy (HCC)    Frequent fall  . PVD (peripheral vascular disease) ([place])    Subclavian stent May 2013 (subclavian steal)    Patient Active Problem List   Diagnosis Date Noted  . Syncope 09/22/2016  . Fall 09/22/2016  . Chronic systolic congestive heart failure ([place])   . Abdominal fibromatosis   . Hyperlipidemia   . Hypertension   . Dizziness 05/02/2015  . CAD (coronary artery disease) 05/02/2015  . PVD (peripheral vascular disease) ([place]) 05/02/2015    Past Surgical History:  Procedure Laterality Date  . BACK SURGERY     x 3 or 4 lumbar, cervical x 2  . CEA    . CORONARY ARTERY BYPASS GRAFT  2011   x 3   . Coronary Stents x3      OB History    No data available       Home Medications    Prior to Admission medications   Medication Sig Start Date  End Date Taking? Authorizing Provider  apixaban (ELIQUIS) 2.5 MG TABS tablet Take 2.5 mg by mouth 2 (two) times daily.   Yes Historical Provider, MD  Ca Carbonate-Mag Hydroxide (ROLAIDS) 550-110 MG CHEW Chew 1 tablet by mouth as needed (for heart burn).   Yes Historical Provider, MD  carvedilol (COREG) 3.125 MG tablet Take 3.125 mg by mouth 2 (two) times daily with a meal.   Yes Historical Provider, MD  clonazePAM (KLONOPIN) 0.5 MG tablet Take 0.5 mg by mouth 2 (two) times daily. Takes 1 tab in daytime as needed and 1 tab every bedtime   Yes Historical Provider, MD  CRANBERRY PO Take 4,200 mg by mouth daily.   Yes Historical Provider, MD  gabapentin (NEURONTIN) 300 MG capsule Take 300-600 mg by mouth See admin instructions. Take 1 capsule at 8 am and 12 pm and then take 2 capsules at bedtime   Yes Historical Provider, MD  HYDROcodone-acetaminophen (NORCO) 7.5-325 MG per tablet Take 1 tablet by mouth every 6 (six) hours as needed for moderate pain.    Yes Historical Provider, MD  lisinopril (PRINIVIL,ZESTRIL) 2.5 MG tablet Take 2.5 mg by mouth daily.   Yes Historical Provider, MD  loratadine (CLARITIN) 10 MG tablet Take 10 mg by mouth daily as needed  for allergies.    Yes Historical Provider, MD  metFORMIN (GLUCOPHAGE) 1000 MG tablet Take 1,000 mg by mouth 2 (two) times daily with a meal.   Yes Historical Provider, MD  methocarbamol (ROBAXIN) 750 MG tablet Take 750 mg by mouth every 6 (six) hours as needed for muscle spasms.    Yes Historical Provider, MD  Multiple Vitamin (MULTIVITAMIN WITH MINERALS) TABS tablet Take 1 tablet by mouth daily.   Yes Historical Provider, MD  PARoxetine (PAXIL) 20 MG tablet Take 20 mg by mouth daily.   Yes Historical Provider, MD  simvastatin (ZOCOR) 40 MG tablet Take 40 mg by mouth daily.   Yes Historical Provider, MD  zolpidem (AMBIEN) 5 MG tablet Take 10 mg by mouth at bedtime as needed for sleep.    Yes Historical Provider, MD    Family History Family History    Problem Relation Age of Onset  . Hypertension Mother   . Heart disease Mother     No details.  Died at age 86  . Diabetes Father   . Heart attack Brother 64    Social History Social History  Substance Use Topics  . Smoking status: Never Smoker  . Smokeless tobacco: Never Used  . Alcohol use No     Allergies   Sulfa antibiotics   Review of Systems Review of Systems  Constitutional: Negative for chills and fever.  HENT: Negative for congestion.   Eyes: Negative for visual disturbance.  Respiratory: Negative for shortness of breath.   Cardiovascular: Negative for chest pain.  Gastrointestinal: Negative for abdominal pain and vomiting.  Genitourinary: Negative for dysuria and flank pain.  Musculoskeletal: Negative for back pain, neck pain and neck stiffness.  Skin: Positive for wound. Negative for rash.  Neurological: Positive for syncope and headaches. Negative for seizures and light-headedness.     Physical Exam Updated Vital Signs BP (!) 123/43   Pulse (!) 53   Temp 97.6 F (36.4 C)   Resp 13   SpO2 97%   Physical Exam  Constitutional: She is oriented to person, place, and time. She appears well-developed and well-nourished.  HENT:  Head: Normocephalic.  Dry mucous membranes Patient has mild bleeding left temporal region in the left side of the head, mild matted hair over the wound, approximately 2 cm laceration for wound care for further delineation. Patient has mild midline tenderness mid cervical paraspinal neck supple  Eyes: Conjunctivae are normal. Right eye exhibits no discharge. Left eye exhibits no discharge.  Neck: Normal range of motion. Neck supple. No tracheal deviation present.  Cardiovascular: Bradycardia present.   Pulmonary/Chest: Effort normal and breath sounds normal.  Abdominal: Soft. She exhibits no distension. There is no tenderness. There is no guarding.  Musculoskeletal: She exhibits no edema.  Patient has no tenderness to range of  motion of hips knees or ankles bilateral. Mild tenderness left posterior shoulder. No other significant wrist elbow tenderness.  Neurological: She is alert and oriented to person, place, and time. No cranial nerve deficit. GCS eye subscore is 4. GCS verbal subscore is 5. GCS motor subscore is 6.  Patient has equal pupils responsive to light, extraocular muscle function intact, equal strength 5+ all extremities grossly sensation of palpation intact in upper lower extremities.  Skin: Skin is warm. No rash noted.  Psychiatric: She has a normal mood and affect.  Nursing note and vitals reviewed.    ED Treatments / Results  Labs (all labs ordered are listed, but only abnormal results are displayed)  Labs Reviewed  CBC WITH DIFFERENTIAL/PLATELET - Abnormal; Notable for the following:       Result Value   RBC 3.33 (*)    Hemoglobin 10.6 (*)    HCT 32.5 (*)    All other components within normal limits  BASIC METABOLIC PANEL - Abnormal; Notable for the following:    Potassium 5.4 (*)    Glucose, Bld 144 (*)    BUN 22 (*)    GFR calc non Af Amer 52 (*)    GFR calc Af Amer 60 (*)    All other components within normal limits  TROPONIN I  URINALYSIS, ROUTINE W REFLEX MICROSCOPIC (NOT AT [facility])    EKG  EKG Interpretation  Date/Time:  Sunday September 21 2016 22:55:03 EDT Ventricular Rate:  55 PR Interval:    QRS Duration: 108 QT Interval:  493 QTC Calculation: 472 R Axis:   35 Text Interpretation:  Atrial flutter with predominant 4:1 AV block Minimal ST elevation, inferior leads   Confirmed by Adalay Azucena MD, [place]ey Digilio [phone]) on 09/21/2016 11:23:04 PM       Radiology Ct Head Wo Contrast  Result Date: 09/22/2016 CLINICAL DATA:  Syncopal episode in bathroom, struck LEFT head, large hematoma. Patient does not recall event. Neck pain. History of hypertension, hyperlipidemia and diabetes. EXAM: CT HEAD WITHOUT CONTRAST CT CERVICAL SPINE WITHOUT CONTRAST TECHNIQUE: Multidetector CT imaging of the  head and cervical spine was performed following the standard protocol without intravenous contrast. Multiplanar CT image reconstructions of the cervical spine were also generated. COMPARISON:  CT HEAD and cervical spine July 11, 2016 FINDINGS: CT HEAD FINDINGS BRAIN: The ventricles and sulci are normal for age. No intraparenchymal hemorrhage, mass effect nor midline shift. Patchy supratentorial white matter hypodensities less than expected for patient's age, though non-specific are most compatible with chronic small vessel ischemic disease. No acute large vascular territory infarcts. No abnormal extra-axial fluid collections. Basal cisterns are patent. VASCULAR: Moderate to severe calcific atherosclerosis of the carotid siphons. SKULL: No skull fracture. Large LEFT frontal scalp hematoma without subcutaneous gas or radiopaque foreign bodies. SINUSES/ORBITS: The mastoid air-cells and included paranasal sinuses are well-aerated. Status post bilateral ocular lens implants. The included ocular globes and orbital contents are non-suspicious. OTHER: None. CT CERVICAL SPINE FINDINGS ALIGNMENT: Vertebral bodies in alignment.  Straightened lordosis. SKULL BASE AND VERTEBRAE: No acute fracture. Old C2 fracture with intact screw, no periprosthetic lucency. Status post C5 through C7 ACDF, incorporated interbody strut. Facet screws C5 through C7, pedicle screws to the level of T2. Posterior hardware is intact, no periprosthetic lucency. Partially incorporated posterior bone graft material. Multilevel mild degenerative discs. C1-2 articulation maintained with mild arthropathy. Osteopenia without destructive bony lesions. SOFT TISSUES AND SPINAL CANAL: Nonacute. Moderate calcific atherosclerosis of LEFT carotid bulb. DISC LEVELS: Mild canal stenosis C3-4. C3-4 severe neural foraminal narrowing, moderate to severe at C5-6, severe at C6-7. UPPER CHEST: Lung apices are clear. OTHER: None. IMPRESSION: CT HEAD: Large LEFT frontal  scalp hematoma.  No skull fracture. No acute intracranial process; negative CT HEAD for age. CT CERVICAL SPINE: No acute fracture or malalignment. Extensive cervical thoracic spinal hardware without hardware failure. Electronically Signed   By: Elon Alas M.D.   On: 09/22/2016 01:02   Ct Cervical Spine Wo Contrast  Result Date: 09/22/2016 CLINICAL DATA:  Syncopal episode in bathroom, struck LEFT head, large hematoma. Patient does not recall event. Neck pain. History of hypertension, hyperlipidemia and diabetes. EXAM: CT HEAD WITHOUT CONTRAST CT CERVICAL SPINE WITHOUT CONTRAST TECHNIQUE:  Multidetector CT imaging of the head and cervical spine was performed following the standard protocol without intravenous contrast. Multiplanar CT image reconstructions of the cervical spine were also generated. COMPARISON:  CT HEAD and cervical spine July 11, 2016 FINDINGS: CT HEAD FINDINGS BRAIN: The ventricles and sulci are normal for age. No intraparenchymal hemorrhage, mass effect nor midline shift. Patchy supratentorial white matter hypodensities less than expected for patient's age, though non-specific are most compatible with chronic small vessel ischemic disease. No acute large vascular territory infarcts. No abnormal extra-axial fluid collections. Basal cisterns are patent. VASCULAR: Moderate to severe calcific atherosclerosis of the carotid siphons. SKULL: No skull fracture. Large LEFT frontal scalp hematoma without subcutaneous gas or radiopaque foreign bodies. SINUSES/ORBITS: The mastoid air-cells and included paranasal sinuses are well-aerated. Status post bilateral ocular lens implants. The included ocular globes and orbital contents are non-suspicious. OTHER: None. CT CERVICAL SPINE FINDINGS ALIGNMENT: Vertebral bodies in alignment.  Straightened lordosis. SKULL BASE AND VERTEBRAE: No acute fracture. Old C2 fracture with intact screw, no periprosthetic lucency. Status post C5 through C7 ACDF,  incorporated interbody strut. Facet screws C5 through C7, pedicle screws to the level of T2. Posterior hardware is intact, no periprosthetic lucency. Partially incorporated posterior bone graft material. Multilevel mild degenerative discs. C1-2 articulation maintained with mild arthropathy. Osteopenia without destructive bony lesions. SOFT TISSUES AND SPINAL CANAL: Nonacute. Moderate calcific atherosclerosis of LEFT carotid bulb. DISC LEVELS: Mild canal stenosis C3-4. C3-4 severe neural foraminal narrowing, moderate to severe at C5-6, severe at C6-7. UPPER CHEST: Lung apices are clear. OTHER: None. IMPRESSION: CT HEAD: Large LEFT frontal scalp hematoma.  No skull fracture. No acute intracranial process; negative CT HEAD for age. CT CERVICAL SPINE: No acute fracture or malalignment. Extensive cervical thoracic spinal hardware without hardware failure. Electronically Signed   By: Elon Alas M.D.   On: 09/22/2016 01:02   Dg Shoulder Left  Result Date: 09/22/2016 CLINICAL DATA:  [place] on mass.  Left shoulder hurts. EXAM: LEFT SHOULDER - 2+ VIEW COMPARISON:  None. FINDINGS: Examination is limited by patient positioning. Partially visualized sternal wires. Atherosclerotic vascular disease of the aorta. There is stent material at the aortic arch. There is no acute fracture or dislocation. Marked degenerative changes of the left shoulder with effacement of the sub acromial space. Probable old fracture of the left sixth rib. There is partially visualized hardware within the cervical spine. IMPRESSION: 1. No acute osseous abnormality allowing for suboptimal positioning. 2. Probable old fracture of the left sixth rib. Electronically Signed   By: Donavan Foil M.D.   On: 09/22/2016 00:54   Dg Hip Unilat W Or Wo Pelvis 2-3 Views Right  Result Date: 09/22/2016 CLINICAL DATA:  Fall, head injury, hip pain EXAM: DG HIP (WITH OR WITHOUT PELVIS) 2-3V RIGHT COMPARISON:  CT 07/11/2016 FINDINGS: There are old fracture  deformities of the right inferior and superior pubic rami. No definite acute displaced fracture or dislocation is visualized. There is SI joint arthritic change. Prominent bone spur off of the right iliac bone. Vascular calcifications. Degenerative changes of the lumbar spine. IMPRESSION: 1. No definite acute osseous abnormality. CT follow-up may be performed continued suspicion for hip fracture. 2. Old right superior and inferior pubic rami fractures. Electronically Signed   By: Donavan Foil M.D.   On: 09/22/2016 00:58    Procedures .Marland KitchenLaceration Repair Date/Time: 09/22/2016 1:28 AM Performed by: Elnora Morrison Authorized by: Elnora Morrison   Consent:    Consent obtained:  Verbal   Consent given by:  Patient   Risks discussed:  Pain Anesthesia (see MAR for exact dosages):    Anesthesia method:  Local infiltration   Local anesthetic:  Lidocaine 1% WITH epi Laceration details:    Location:  Scalp   Length (cm):  2   Depth (mm):  5 Repair type:    Repair type:  Simple Pre-procedure details:    Preparation:  Patient was prepped and draped in usual sterile fashion Exploration:    Hemostasis achieved with:  Direct pressure Treatment:    Irrigation solution:  Sterile saline   Visualized foreign bodies/material removed: no   Skin repair:    Repair method:  Sutures   Suture size:  4-0   Suture material:  Prolene   Suture technique:  Simple interrupted   Number of sutures:  3 Approximation:    Approximation:  Close   (including critical care time)  Medications Ordered in ED Medications  sodium chloride 0.9 % bolus 500 mL (not administered)  lidocaine (PF) (XYLOCAINE) 1 % injection 5 mL (5 mLs Intradermal Given by Other 09/22/16 0137)     Initial Impression / Assessment and Plan / ED Course  I have reviewed the triage vital signs and the nursing notes.  Pertinent labs & imaging results that were available during my care of the patient were reviewed by me and considered in my  medical decision making (see chart for details).  Clinical Course   Patient presents after near syncopal episode and head injury. Patient had baseline currently family in the room. Patient's laceration cleaned and repaired in the ER. CT scans and x-rays no acute fracture or internal bleeding. Urinalysis pending. Patient has had a couple heart rates 30s and 40s recorded by nursing. Plan to hold bblocker and obs tele.   The patients results and plan were reviewed and discussed.   Any x-rays performed were independently reviewed by myself.   Differential diagnosis were considered with the presenting HPI.  Medications  sodium chloride 0.9 % bolus 500 mL (not administered)  lidocaine (PF) (XYLOCAINE) 1 % injection 5 mL (5 mLs Intradermal Given by Other 09/22/16 0137)    Vitals:   09/21/16 2146 09/21/16 2330 09/22/16 0045 09/22/16 0130  BP: 135/67 (!) 138/53 (!) 129/52 (!) 123/43  Pulse: (!) 53 72 (!) 58 (!) 53  Resp: 20 13    Temp: 97.6 F (36.4 C)     SpO2: 99% 96% 97% 97%    Final diagnoses:  Syncope and collapse  Fall, initial encounter  Laceration of scalp without foreign body, initial encounter  Chronic systolic congestive heart failure ([place])  Abdominal fibromatosis  Hyperlipidemia, unspecified hyperlipidemia type  Essential hypertension    Admission/ observation were discussed with the admitting physician, patient and/or family and they are comfortable with the plan.    Final Clinical Impressions(s) / ED Diagnoses   Final diagnoses:  Syncope and collapse  Fall, initial encounter  Laceration of scalp without foreign body, initial encounter  Chronic systolic congestive heart failure ([place])  Abdominal fibromatosis  Hyperlipidemia, unspecified hyperlipidemia type  Essential hypertension    New Prescriptions New Prescriptions   No medications on file     Elnora Morrison, MD 09/22/16 0144

## 2016-09-22 ENCOUNTER — Emergency Department (HOSPITAL_COMMUNITY): Payer: Medicare Other

## 2016-09-22 ENCOUNTER — Encounter (HOSPITAL_COMMUNITY): Payer: Self-pay | Admitting: Internal Medicine

## 2016-09-22 DIAGNOSIS — S0101XA Laceration without foreign body of scalp, initial encounter: Secondary | ICD-10-CM | POA: Diagnosis present

## 2016-09-22 DIAGNOSIS — I4892 Unspecified atrial flutter: Secondary | ICD-10-CM

## 2016-09-22 DIAGNOSIS — D4819 Other specified neoplasm of uncertain behavior of connective and other soft tissue: Secondary | ICD-10-CM | POA: Insufficient documentation

## 2016-09-22 DIAGNOSIS — W19XXXA Unspecified fall, initial encounter: Secondary | ICD-10-CM | POA: Diagnosis not present

## 2016-09-22 DIAGNOSIS — M542 Cervicalgia: Secondary | ICD-10-CM | POA: Diagnosis not present

## 2016-09-22 DIAGNOSIS — I10 Essential (primary) hypertension: Secondary | ICD-10-CM | POA: Diagnosis not present

## 2016-09-22 DIAGNOSIS — E875 Hyperkalemia: Secondary | ICD-10-CM | POA: Diagnosis not present

## 2016-09-22 DIAGNOSIS — I4891 Unspecified atrial fibrillation: Secondary | ICD-10-CM | POA: Diagnosis present

## 2016-09-22 DIAGNOSIS — E785 Hyperlipidemia, unspecified: Secondary | ICD-10-CM | POA: Diagnosis present

## 2016-09-22 DIAGNOSIS — I5022 Chronic systolic (congestive) heart failure: Secondary | ICD-10-CM | POA: Diagnosis present

## 2016-09-22 DIAGNOSIS — R55 Syncope and collapse: Secondary | ICD-10-CM | POA: Diagnosis not present

## 2016-09-22 DIAGNOSIS — I2510 Atherosclerotic heart disease of native coronary artery without angina pectoris: Secondary | ICD-10-CM | POA: Diagnosis not present

## 2016-09-22 DIAGNOSIS — D481 Neoplasm of uncertain behavior of connective and other soft tissue: Secondary | ICD-10-CM | POA: Insufficient documentation

## 2016-09-22 DIAGNOSIS — S0003XA Contusion of scalp, initial encounter: Secondary | ICD-10-CM | POA: Diagnosis not present

## 2016-09-22 DIAGNOSIS — E119 Type 2 diabetes mellitus without complications: Secondary | ICD-10-CM

## 2016-09-22 LAB — CBC WITH DIFFERENTIAL/PLATELET
Basophils Absolute: 0 10*3/uL (ref 0.0–0.1)
Basophils Relative: 0 %
EOS ABS: 0.2 10*3/uL (ref 0.0–0.7)
Eosinophils Relative: 2 %
HEMATOCRIT: 32.5 % — AB (ref 36.0–46.0)
HEMOGLOBIN: 10.6 g/dL — AB (ref 12.0–15.0)
LYMPHS ABS: 2.9 10*3/uL (ref 0.7–4.0)
LYMPHS PCT: 30 %
MCH: 31.8 pg (ref 26.0–34.0)
MCHC: 32.6 g/dL (ref 30.0–36.0)
MCV: 97.6 fL (ref 78.0–100.0)
MONOS PCT: 8 %
Monocytes Absolute: 0.8 10*3/uL (ref 0.1–1.0)
NEUTROS ABS: 5.8 10*3/uL (ref 1.7–7.7)
NEUTROS PCT: 60 %
Platelets: 205 10*3/uL (ref 150–400)
RBC: 3.33 MIL/uL — AB (ref 3.87–5.11)
RDW: 13.3 % (ref 11.5–15.5)
WBC: 9.7 10*3/uL (ref 4.0–10.5)

## 2016-09-22 LAB — PROTIME-INR
INR: 1.17
Prothrombin Time: 14.9 seconds (ref 11.4–15.2)

## 2016-09-22 LAB — URINALYSIS, ROUTINE W REFLEX MICROSCOPIC
Bilirubin Urine: NEGATIVE
GLUCOSE, UA: NEGATIVE mg/dL
HGB URINE DIPSTICK: NEGATIVE
Ketones, ur: NEGATIVE mg/dL
Nitrite: NEGATIVE
PROTEIN: NEGATIVE mg/dL
SPECIFIC GRAVITY, URINE: 1.014 (ref 1.005–1.030)
pH: 7 (ref 5.0–8.0)

## 2016-09-22 LAB — BASIC METABOLIC PANEL
Anion gap: 8 (ref 5–15)
Anion gap: 9 (ref 5–15)
BUN: 20 mg/dL (ref 6–20)
BUN: 22 mg/dL — AB (ref 6–20)
CHLORIDE: 103 mmol/L (ref 101–111)
CHLORIDE: 102 mmol/L (ref 101–111)
CO2: 26 mmol/L (ref 22–32)
CO2: 28 mmol/L (ref 22–32)
CREATININE: 0.89 mg/dL (ref 0.44–1.00)
CREATININE: 0.99 mg/dL (ref 0.44–1.00)
Calcium: 9 mg/dL (ref 8.9–10.3)
Calcium: 9.5 mg/dL (ref 8.9–10.3)
GFR calc Af Amer: 60 mL/min — ABNORMAL LOW (ref >60)
GFR calc non Af Amer: 52 mL/min — ABNORMAL LOW (ref >60)
GFR calc non Af Amer: 59 mL/min — ABNORMAL LOW (ref >60)
GLUCOSE: 144 mg/dL — AB (ref 65–99)
Glucose, Bld: 142 mg/dL — ABNORMAL HIGH (ref 65–99)
POTASSIUM: 5.4 mmol/L — AB (ref 3.5–5.1)
Potassium: 4.4 mmol/L (ref 3.5–5.1)
SODIUM: 138 mmol/L (ref 135–145)
Sodium: 138 mmol/L (ref 135–145)

## 2016-09-22 LAB — URINE MICROSCOPIC-ADD ON: Squamous Epithelial / LPF: NONE SEEN

## 2016-09-22 LAB — GLUCOSE, CAPILLARY
GLUCOSE-CAPILLARY: 124 mg/dL — AB (ref 65–99)
GLUCOSE-CAPILLARY: 128 mg/dL — AB (ref 65–99)
Glucose-Capillary: 204 mg/dL — ABNORMAL HIGH (ref 65–99)
Glucose-Capillary: 138 mg/dL — ABNORMAL HIGH (ref 65–99)

## 2016-09-22 LAB — CBC
HCT: 31.3 % — ABNORMAL LOW (ref 36.0–46.0)
HEMOGLOBIN: 10 g/dL — AB (ref 12.0–15.0)
MCH: 31.3 pg (ref 26.0–34.0)
MCHC: 31.9 g/dL (ref 30.0–36.0)
MCV: 98.1 fL (ref 78.0–100.0)
Platelets: 198 10*3/uL (ref 150–400)
RBC: 3.19 MIL/uL — AB (ref 3.87–5.11)
RDW: 13.4 % (ref 11.5–15.5)
WBC: 8.7 10*3/uL (ref 4.0–10.5)

## 2016-09-22 LAB — BRAIN NATRIURETIC PEPTIDE: B Natriuretic Peptide: 201.8 pg/mL — ABNORMAL HIGH (ref 0.0–100.0)

## 2016-09-22 LAB — TROPONIN I

## 2016-09-22 MED ORDER — GABAPENTIN 600 MG PO TABS
600.0000 mg | ORAL_TABLET | Freq: Every day | ORAL | Status: DC
  Administered 2016-09-22: 600 mg via ORAL
  Filled 2016-09-22: qty 1

## 2016-09-22 MED ORDER — LISINOPRIL 2.5 MG PO TABS
2.5000 mg | ORAL_TABLET | Freq: Every day | ORAL | Status: DC
  Administered 2016-09-22 – 2016-09-23 (×2): 2.5 mg via ORAL
  Filled 2016-09-22 (×2): qty 1

## 2016-09-22 MED ORDER — GABAPENTIN 300 MG PO CAPS
300.0000 mg | ORAL_CAPSULE | ORAL | Status: DC
Start: 2016-09-22 — End: 2016-09-22

## 2016-09-22 MED ORDER — ACETAMINOPHEN 650 MG RE SUPP: 650.0000 mg | Freq: Four times a day (QID) | RECTAL | Status: DC | PRN

## 2016-09-22 MED ORDER — SIMVASTATIN 40 MG PO TABS
40.0000 mg | ORAL_TABLET | Freq: Every day | ORAL | Status: DC
  Administered 2016-09-22: 40 mg via ORAL
  Filled 2016-09-22: qty 1

## 2016-09-22 MED ORDER — SODIUM CHLORIDE 0.9 % IV SOLN
INTRAVENOUS | Status: DC
  Administered 2016-09-22: 1000 mL via INTRAVENOUS
  Administered 2016-09-23: 04:00:00 via INTRAVENOUS

## 2016-09-22 MED ORDER — ZOLPIDEM TARTRATE 5 MG PO TABS
5.0000 mg | ORAL_TABLET | Freq: Every evening | ORAL | Status: DC | PRN
  Administered 2016-09-22: 5 mg via ORAL
  Filled 2016-09-22: qty 1

## 2016-09-22 MED ORDER — PAROXETINE HCL 20 MG PO TABS
20.0000 mg | ORAL_TABLET | Freq: Every day | ORAL | Status: DC
  Administered 2016-09-22 – 2016-09-23 (×2): 20 mg via ORAL
  Filled 2016-09-22 (×2): qty 1

## 2016-09-22 MED ORDER — ONDANSETRON HCL 4 MG PO TABS: 4.0000 mg | ORAL_TABLET | Freq: Four times a day (QID) | ORAL | Status: DC | PRN

## 2016-09-22 MED ORDER — INSULIN ASPART 100 UNIT/ML ~~LOC~~ SOLN: 0.0000 [IU] | Freq: Every day | SUBCUTANEOUS | Status: DC

## 2016-09-22 MED ORDER — ADULT MULTIVITAMIN W/MINERALS CH
1.0000 | ORAL_TABLET | Freq: Every day | ORAL | Status: DC
  Administered 2016-09-22 – 2016-09-23 (×2): 1 via ORAL
  Filled 2016-09-22 (×2): qty 1

## 2016-09-22 MED ORDER — CALCIUM CARBONATE ANTACID 500 MG PO CHEW
1.0000 | CHEWABLE_TABLET | ORAL | Status: DC | PRN
  Administered 2016-09-22 (×2): 200 mg via ORAL
  Filled 2016-09-22 (×2): qty 1

## 2016-09-22 MED ORDER — SODIUM POLYSTYRENE SULFONATE 15 GM/60ML PO SUSP
30.0000 g | Freq: Once | ORAL | Status: AC
  Administered 2016-09-22: 30 g via ORAL
  Filled 2016-09-22: qty 120

## 2016-09-22 MED ORDER — CRANBERRY 400 MG PO TABS: 4200.0000 mg | ORAL_TABLET | Freq: Every day | ORAL | Status: DC

## 2016-09-22 MED ORDER — LORATADINE 10 MG PO TABS: 10.0000 mg | ORAL_TABLET | Freq: Every day | ORAL | Status: DC | PRN

## 2016-09-22 MED ORDER — INSULIN ASPART 100 UNIT/ML ~~LOC~~ SOLN
0.0000 [IU] | Freq: Three times a day (TID) | SUBCUTANEOUS | Status: DC
  Administered 2016-09-22: 3 [IU] via SUBCUTANEOUS
  Administered 2016-09-22 – 2016-09-23 (×2): 1 [IU] via SUBCUTANEOUS

## 2016-09-22 MED ORDER — ACETAMINOPHEN 325 MG PO TABS: 650.0000 mg | ORAL_TABLET | Freq: Four times a day (QID) | ORAL | Status: DC | PRN

## 2016-09-22 MED ORDER — HYDROCODONE-ACETAMINOPHEN 7.5-325 MG PO TABS
1.0000 | ORAL_TABLET | Freq: Four times a day (QID) | ORAL | Status: DC | PRN
  Administered 2016-09-22 – 2016-09-23 (×3): 1 via ORAL
  Filled 2016-09-22 (×3): qty 1

## 2016-09-22 MED ORDER — GABAPENTIN 300 MG PO CAPS
300.0000 mg | ORAL_CAPSULE | ORAL | Status: DC
  Administered 2016-09-22 – 2016-09-23 (×2): 300 mg via ORAL
  Filled 2016-09-22 (×2): qty 1

## 2016-09-22 MED ORDER — CLONAZEPAM 0.5 MG PO TABS
0.5000 mg | ORAL_TABLET | Freq: Two times a day (BID) | ORAL | Status: DC
  Administered 2016-09-22 – 2016-09-23 (×3): 0.5 mg via ORAL
  Filled 2016-09-22 (×3): qty 1

## 2016-09-22 MED ORDER — SODIUM CHLORIDE 0.9% FLUSH
3.0000 mL | Freq: Two times a day (BID) | INTRAVENOUS | Status: DC
  Administered 2016-09-23: 3 mL via INTRAVENOUS

## 2016-09-22 MED ORDER — ONDANSETRON HCL 4 MG/2ML IJ SOLN: 4.0000 mg | Freq: Four times a day (QID) | INTRAMUSCULAR | Status: DC | PRN

## 2016-09-22 MED ORDER — CA CARBONATE-MAG HYDROXIDE 550-110 MG PO CHEW: 1.0000 | CHEWABLE_TABLET | ORAL | Status: DC | PRN

## 2016-09-22 MED ORDER — METHOCARBAMOL 500 MG PO TABS: 750.0000 mg | ORAL_TABLET | Freq: Four times a day (QID) | ORAL | Status: DC | PRN

## 2016-09-22 NOTE — Evaluation (Signed)
Physical Therapy Evaluation Patient Details Name: Jessica King MRN: [id number] DOB: 06-Mar-1932 Today's Date: 09/22/2016   History of Present Illness  82 y.o. female with medical history significant of hypertension, hyperlipidemia, diabetes mellitus, PVD, iron deficiency anemia, CAD,s/p of CABG, s/p of stent placement, atrial fibrillation on Eliquis, who presents with fall and syncope. Lt forehead laceration; xrays negative for fracture; pt does not recall what happened "just blaced out."    Clinical Impression  Pt presented with above diagnosis. 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 She reports she is independent at a wheelchair level and therefore should be able to return to ALF level of care. Pt currently with functional limitations due to the deficits listed below (see PT Problem List).  Pt will benefit from skilled PT to increase their independence and safety with mobility to allow discharge to the venue listed below.       Follow Up Recommendations Home health PT;Supervision/Assistance - 24 hour (HHPT at ALF)    Equipment Recommendations  None recommended by PT    Recommendations for Other Services       Precautions / Restrictions Precautions Precautions: Fall Restrictions Weight Bearing Restrictions: No      Mobility  Bed Mobility Overal bed mobility: Modified Independent             General bed mobility comments: supine to sit with rail; sit to supine no rail  Transfers Overall transfer level: Needs assistance Equipment used: Rolling walker (2 wheeled) Transfers: Sit to/from Stand Sit to Stand: Min guard         General transfer comment: for safety  Ambulation/Gait Ambulation/Gait assistance: Min guard Ambulation Distance (Feet): 10 Feet (x2) Assistive device: Rolling walker (2 wheeled) Gait Pattern/deviations: Step-through pattern;Decreased stride length;Trunk flexed   Gait velocity  interpretation: Below normal speed for age/gender General Gait Details: slow, steady with RW   Stairs            Wheelchair Mobility    Modified Rankin (Stroke Patients Only)       Balance Overall balance assessment: History of Falls;Needs assistance Sitting-balance support: No upper extremity supported;Feet supported Sitting balance-Leahy Scale: Fair     Standing balance support: Bilateral upper extremity supported Standing balance-Leahy Scale: Poor                               Pertinent Vitals/Pain Pain Assessment: Faces Faces Pain Scale: Hurts little more Pain Location: lower back Pain Descriptors / Indicators: Aching;Discomfort Pain Intervention(s): Limited activity within patient's tolerance;Monitored during session;Repositioned    Home Living Family/patient expects to be discharged to:: Assisted living               Home Equipment: Walker - 4 wheels;Wheelchair - manual      Prior Function Level of Independence: Needs assistance   Gait / Transfers Assistance Needed: independent with wheelchair (began using due to falls, per pt); undergoing HHPT for gait training and using a rollator  ADL's / Homemaking Assistance Needed: assist by staff        Hand Dominance   Dominant Hand: Right    Extremity/Trunk Assessment   Upper Extremity Assessment: RUE deficits/detail;LUE deficits/detail RUE Deficits / Details: elbow, wrist, hand WFL; shoulder flexion to 70 (unable to reach top of head)     LUE Deficits / Details: elbow, wrist, hand WFL; shoulder flexion to 50 (unable to reach top of  head)   Lower Extremity Assessment: Generalized weakness;RLE deficits/detail;LLE deficits/detail RLE Deficits / Details: Rt great toe extension 0/5; DF 5/5       Communication   Communication: No difficulties  Cognition Arousal/Alertness: Awake/alert Behavior During Therapy: WFL for tasks assessed/performed Overall Cognitive Status: No  family/caregiver present to determine baseline cognitive functioning       Memory: Decreased short-term memory (could not remember name of facility where she lives)              General Comments General comments (skin integrity, edema, etc.): Patient demonstrates caution. Discussed trying to find shoes with less cushion as this impairs her balance further due to severe neuropathy.     Exercises General Exercises - Lower Extremity Ankle Circles/Pumps: AROM;Both;10 reps Heel Slides: AROM;Both;5 reps   Assessment/Plan    PT Assessment Patient needs continued PT services  PT Problem List Decreased strength;Decreased balance;Decreased mobility;Decreased cognition;Decreased knowledge of use of DME;Decreased safety awareness;Decreased knowledge of precautions;Impaired sensation;Pain          PT Treatment Interventions DME instruction;Gait training;Functional mobility training;Therapeutic activities;Therapeutic exercise;Balance training;Cognitive remediation;Patient/family education;Neuromuscular re-education    PT Goals (Current goals can be found in the Care Plan section)  Acute Rehab PT Goals Patient Stated Goal: continue to work on her walking PT Goal Formulation: With patient Time For Goal Achievement: 10/06/16 Potential to Achieve Goals: Good    Frequency Min 2X/week   Barriers to discharge        Co-evaluation               End of Session Equipment Utilized During Treatment: Gait belt Activity Tolerance: Patient tolerated treatment well Patient left: in bed;with call bell/phone within reach;with bed alarm set      Functional Assessment Tool Used: clinical judgement Functional Limitation: Mobility: Walking and moving around Mobility: Walking and Moving Around Current Status ([id number]): At least 20 percent but less than 40 percent impaired, limited or restricted Mobility: Walking and Moving Around Goal Status [phone]): At least 20 percent but less than 40 percent  impaired, limited or restricted Mobility: Walking and Moving Around Discharge Status [phone]): At least 20 percent but less than 40 percent impaired, limited or restricted    Time: 0929-1001 PT Time Calculation (min) (ACUTE ONLY): 32 min   Charges:   PT Evaluation $PT Eval Low Complexity: 1 Procedure PT Treatments $Therapeutic Activity: 8-22 mins   PT G Codes:   PT G-Codes **NOT FOR INPATIENT CLASS** Functional Assessment Tool Used: clinical judgement Functional Limitation: Mobility: Walking and moving around Mobility: Walking and Moving Around Current Status (Q0347): At least 20 percent but less than 40 percent impaired, limited or restricted Mobility: Walking and Moving Around Goal Status [phone]): At least 20 percent but less than 40 percent impaired, limited or restricted Mobility: Walking and Moving Around Discharge Status [phone]): At least 20 percent but less than 40 percent impaired, limited or restricted    [place] [employer] 09/22/2016, 10:17 AM Pager [phone]

## 2016-09-22 NOTE — ED Notes (Signed)
Called lab to add on INR 

## 2016-09-22 NOTE — H&P (Signed)
History and Physical    Jessica King EPP:[id number] DOB: 02/10/1932 DOA: 09/21/2016  Referring MD/NP/PA:   PCP: Mathews Argyle, MD   Patient coming from:  The patient is coming from SNF.  At baseline, pt is dependent for her ADL.       Chief Complaint: fall and syncope  HPI: Jessica King is a 82 y.o. female with medical history significant of hypertension, hyperlipidemia, diabetes mellitus, PVD, iron deficiency anemia, CAD,s/p of CABG, s/p of stent placement, atrial fibrillation on Eliquis, who presents with fall and syncope.  Per patient's daughter, patient had multiple falls recently, 3 times in this week. She had fall again today. Upon triage, pt states that she "does not remember this fall".  Thinks she may have "blacked out". Pt sustained head injury with skin tear in left frontal area. She also has left shoulder and right hip pain. Patient denies prodromal symptoms, no unilateral weakness, numbness or tingling seen in extremities, no dizziness, chest pain, palpitation, lightheadedness. No vision change. Patient states that recently she seems to have mild hearing loss. Patient states that sometimes she has a diarrhea recently, but no diarrhea today. Denies nausea, vomiting, abdominal pain, symptoms of  UTI. She states that she has bilateral peripheral leg neuropathy, making her feeling weak and difficulty walking.  ED Course: pt was found to have bradycardia with heart rate down to 37, blood pressure 178/53, WBC 9.7, negative troponin, urinalysis trace amount of leukocytes, potassium of 5.4 without T-wave peaking in EKG, creatinine normal, temperature normal, negative x-ray of left shoulder and right hip/pelvis. CT head and CT neck are negative for acute intracranial abnormalities or fracture.  Review of Systems:   General: no fevers, chills, no changes in body weight, has poor appetite, has fatigue HEENT: no blurry vision, has hearing changes. No sore throat Respiratory: no  dyspnea, coughing, wheezing CV: no chest pain, no palpitations GI: no nausea, vomiting, abdominal pain, diarrhea, constipation GU: no dysuria, burning on urination, increased urinary frequency, hematuria  Ext: no leg edema Neuro: no unilateral weakness, numbness, or tingling, no vision change or hearing loss Skin: no rash. Has scalp skin tear. MSK: has left shoulder and right hip pain. Heme: No easy bruising.  Travel history: No recent long distant travel.  Allergy:  Allergies  Allergen Reactions  . Sulfa Antibiotics Swelling and Other (See Comments)    Maybe other reactions, unknown specifically    Past Medical History:  Diagnosis Date  . Abdominal fibromatosis   . Allergic rhinitis   . CAD (coronary artery disease)   . Chronic systolic congestive heart failure ([place])   . Coronary artery disease    Cardiac cath 1999, 2007, 2010, Taxus stent x 2 to LAD 2007.   . Diabetes mellitus without complication ([place])   . Diverticulosis   . Dyspnea   . Hyperlipidemia   . Hypertension   . Iron deficiency anemia   . Peripheral neuropathy (HCC)    Frequent fall  . PVD (peripheral vascular disease) ([place])    Subclavian stent May 2013 (subclavian steal)    Past Surgical History:  Procedure Laterality Date  . BACK SURGERY     x 3 or 4 lumbar, cervical x 2  . CEA    . CORONARY ARTERY BYPASS GRAFT  2011   x 3   . Coronary Stents x3      Social History:  reports that she has never smoked. She has never used smokeless tobacco. She reports that she does not drink alcohol. Her  drug history is not on file.  Family History:  Family History  Problem Relation Age of Onset  . Hypertension Mother   . Heart disease Mother     No details.  Died at age 39  . Diabetes Father   . Heart attack Brother 48     Prior to Admission medications   Medication Sig Start Date End Date Taking? Authorizing Provider  apixaban (ELIQUIS) 2.5 MG TABS tablet Take 2.5 mg by mouth 2 (two) times daily.   Yes  Historical Provider, MD  Ca Carbonate-Mag Hydroxide (ROLAIDS) 550-110 MG CHEW Chew 1 tablet by mouth as needed (for heart burn).   Yes Historical Provider, MD  carvedilol (COREG) 3.125 MG tablet Take 3.125 mg by mouth 2 (two) times daily with a meal.   Yes Historical Provider, MD  clonazePAM (KLONOPIN) 0.5 MG tablet Take 0.5 mg by mouth 2 (two) times daily. Takes 1 tab in daytime as needed and 1 tab every bedtime   Yes Historical Provider, MD  CRANBERRY PO Take 4,200 mg by mouth daily.   Yes Historical Provider, MD  gabapentin (NEURONTIN) 300 MG capsule Take 300-600 mg by mouth See admin instructions. Take 1 capsule at 8 am and 12 pm and then take 2 capsules at bedtime   Yes Historical Provider, MD  HYDROcodone-acetaminophen (NORCO) 7.5-325 MG per tablet Take 1 tablet by mouth every 6 (six) hours as needed for moderate pain.    Yes Historical Provider, MD  lisinopril (PRINIVIL,ZESTRIL) 2.5 MG tablet Take 2.5 mg by mouth daily.   Yes Historical Provider, MD  loratadine (CLARITIN) 10 MG tablet Take 10 mg by mouth daily as needed for allergies.    Yes Historical Provider, MD  metFORMIN (GLUCOPHAGE) 1000 MG tablet Take 1,000 mg by mouth 2 (two) times daily with a meal.   Yes Historical Provider, MD  methocarbamol (ROBAXIN) 750 MG tablet Take 750 mg by mouth every 6 (six) hours as needed for muscle spasms.    Yes Historical Provider, MD  Multiple Vitamin (MULTIVITAMIN WITH MINERALS) TABS tablet Take 1 tablet by mouth daily.   Yes Historical Provider, MD  PARoxetine (PAXIL) 20 MG tablet Take 20 mg by mouth daily.   Yes Historical Provider, MD  simvastatin (ZOCOR) 40 MG tablet Take 40 mg by mouth daily.   Yes Historical Provider, MD  zolpidem (AMBIEN) 5 MG tablet Take 10 mg by mouth at bedtime as needed for sleep.    Yes Historical Provider, MD    Physical Exam: Vitals:   09/22/16 0045 09/22/16 0130 09/22/16 0152 09/22/16 0520  BP: (!) 129/52 (!) 123/43 123/63   Pulse: (!) 58 (!) 53 77   Resp:   (!)  29   Temp:    97.8 F (36.6 C)  TempSrc:    Oral  SpO2: 97% 97% 99% 99%   General: Not in acute distress HEENT:       Eyes: PERRL, EOMI, no scleral icterus.       ENT: No discharge from the ears and nose, no pharynx injection, no tonsillar enlargement.        Neck: No JVD, no bruit, no mass felt. Heme: No neck lymph node enlargement. Cardiac: S1/S2, RRR, No murmurs, No gallops or rubs. Respiratory: No rales, wheezing, rhonchi or rubs. GI: Soft, nondistended, nontender, no rebound pain, no organomegaly, BS present. GU: No hematuria Ext: No pitting leg edema bilaterally. 2+DP/PT pulse bilaterally. Musculoskeletal: No joint deformities, No joint redness or warmth, no limitation of ROM in spin.  Skin: No rashes. Has scalp skin tear in left frontal area. Neuro: Alert, oriented X3, cranial nerves II-XII grossly intact, moves all extremities normally. Muscle strength 5/5 in all extremities, sensation to light touch intact. Brachial reflex 2+ bilaterally. Negative Babinski's sign. Normal finger to nose test. Psych: Patient is not psychotic, no suicidal or hemocidal ideation.  Labs on Admission: I have personally reviewed following labs and imaging studies  CBC:  Recent Labs Lab 09/21/16 2332 09/22/16 0417  WBC 9.7 8.7  NEUTROABS 5.8  --   HGB 10.6* 10.0*  HCT 32.5* 31.3*  MCV 97.6 98.1  PLT 205 361   Basic Metabolic Panel:  Recent Labs Lab 09/21/16 2332 09/22/16 0417  NA 138 138  K 5.4* 4.4  CL 102 103  CO2 28 26  GLUCOSE 144* 142*  BUN 22* 20  CREATININE 0.99 0.89  CALCIUM 9.5 9.0   GFR: CrCl cannot be calculated (Unknown ideal weight.). Liver Function Tests: No results for input(s): AST, ALT, ALKPHOS, BILITOT, PROT, ALBUMIN in the last 168 hours. No results for input(s): LIPASE, AMYLASE in the last 168 hours. No results for input(s): AMMONIA in the last 168 hours. Coagulation Profile:  Recent Labs Lab 09/21/16 2332  INR 1.17   Cardiac Enzymes:  Recent  Labs Lab 09/21/16 2332  TROPONINI <0.03   BNP (last 3 results) No results for input(s): PROBNP in the last 8760 hours. HbA1C: No results for input(s): HGBA1C in the last 72 hours. CBG:  Recent Labs Lab 09/22/16 0554  GLUCAP 124*   Lipid Profile: No results for input(s): CHOL, HDL, LDLCALC, TRIG, CHOLHDL, LDLDIRECT in the last 72 hours. Thyroid Function Tests: No results for input(s): TSH, T4TOTAL, FREET4, T3FREE, THYROIDAB in the last 72 hours. Anemia Panel: No results for input(s): VITAMINB12, FOLATE, FERRITIN, TIBC, IRON, RETICCTPCT in the last 72 hours. Urine analysis:    Component Value Date/Time   COLORURINE YELLOW 09/21/2016 0217   APPEARANCEUR CLEAR 09/21/2016 0217   LABSPEC 1.014 09/21/2016 0217   PHURINE 7.0 09/21/2016 0217   GLUCOSEU NEGATIVE 09/21/2016 0217   HGBUR NEGATIVE 09/21/2016 0217   BILIRUBINUR NEGATIVE 09/21/2016 0217   KETONESUR NEGATIVE 09/21/2016 0217   PROTEINUR NEGATIVE 09/21/2016 0217   NITRITE NEGATIVE 09/21/2016 0217   LEUKOCYTESUR TRACE (A) 09/21/2016 0217   Sepsis Labs: @LABRCNTIP (procalcitonin:4,lacticidven:4) )No results found for this or any previous visit (from the past 240 hour(s)).   Radiological Exams on Admission: Ct Head Wo Contrast  Result Date: 09/22/2016 CLINICAL DATA:  Syncopal episode in bathroom, struck LEFT head, large hematoma. Patient does not recall event. Neck pain. History of hypertension, hyperlipidemia and diabetes. EXAM: CT HEAD WITHOUT CONTRAST CT CERVICAL SPINE WITHOUT CONTRAST TECHNIQUE: Multidetector CT imaging of the head and cervical spine was performed following the standard protocol without intravenous contrast. Multiplanar CT image reconstructions of the cervical spine were also generated. COMPARISON:  CT HEAD and cervical spine July 11, 2016 FINDINGS: CT HEAD FINDINGS BRAIN: The ventricles and sulci are normal for age. No intraparenchymal hemorrhage, mass effect nor midline shift. Patchy supratentorial  white matter hypodensities less than expected for patient's age, though non-specific are most compatible with chronic small vessel ischemic disease. No acute large vascular territory infarcts. No abnormal extra-axial fluid collections. Basal cisterns are patent. VASCULAR: Moderate to severe calcific atherosclerosis of the carotid siphons. SKULL: No skull fracture. Large LEFT frontal scalp hematoma without subcutaneous gas or radiopaque foreign bodies. SINUSES/ORBITS: The mastoid air-cells and included paranasal sinuses are well-aerated. Status post bilateral ocular lens implants. The  included ocular globes and orbital contents are non-suspicious. OTHER: None. CT CERVICAL SPINE FINDINGS ALIGNMENT: Vertebral bodies in alignment.  Straightened lordosis. SKULL BASE AND VERTEBRAE: No acute fracture. Old C2 fracture with intact screw, no periprosthetic lucency. Status post C5 through C7 ACDF, incorporated interbody strut. Facet screws C5 through C7, pedicle screws to the level of T2. Posterior hardware is intact, no periprosthetic lucency. Partially incorporated posterior bone graft material. Multilevel mild degenerative discs. C1-2 articulation maintained with mild arthropathy. Osteopenia without destructive bony lesions. SOFT TISSUES AND SPINAL CANAL: Nonacute. Moderate calcific atherosclerosis of LEFT carotid bulb. DISC LEVELS: Mild canal stenosis C3-4. C3-4 severe neural foraminal narrowing, moderate to severe at C5-6, severe at C6-7. UPPER CHEST: Lung apices are clear. OTHER: None. IMPRESSION: CT HEAD: Large LEFT frontal scalp hematoma.  No skull fracture. No acute intracranial process; negative CT HEAD for age. CT CERVICAL SPINE: No acute fracture or malalignment. Extensive cervical thoracic spinal hardware without hardware failure. Electronically Signed   By: Elon Alas M.D.   On: 09/22/2016 01:02   Ct Cervical Spine Wo Contrast  Result Date: 09/22/2016 CLINICAL DATA:  Syncopal episode in bathroom,  struck LEFT head, large hematoma. Patient does not recall event. Neck pain. History of hypertension, hyperlipidemia and diabetes. EXAM: CT HEAD WITHOUT CONTRAST CT CERVICAL SPINE WITHOUT CONTRAST TECHNIQUE: Multidetector CT imaging of the head and cervical spine was performed following the standard protocol without intravenous contrast. Multiplanar CT image reconstructions of the cervical spine were also generated. COMPARISON:  CT HEAD and cervical spine July 11, 2016 FINDINGS: CT HEAD FINDINGS BRAIN: The ventricles and sulci are normal for age. No intraparenchymal hemorrhage, mass effect nor midline shift. Patchy supratentorial white matter hypodensities less than expected for patient's age, though non-specific are most compatible with chronic small vessel ischemic disease. No acute large vascular territory infarcts. No abnormal extra-axial fluid collections. Basal cisterns are patent. VASCULAR: Moderate to severe calcific atherosclerosis of the carotid siphons. SKULL: No skull fracture. Large LEFT frontal scalp hematoma without subcutaneous gas or radiopaque foreign bodies. SINUSES/ORBITS: The mastoid air-cells and included paranasal sinuses are well-aerated. Status post bilateral ocular lens implants. The included ocular globes and orbital contents are non-suspicious. OTHER: None. CT CERVICAL SPINE FINDINGS ALIGNMENT: Vertebral bodies in alignment.  Straightened lordosis. SKULL BASE AND VERTEBRAE: No acute fracture. Old C2 fracture with intact screw, no periprosthetic lucency. Status post C5 through C7 ACDF, incorporated interbody strut. Facet screws C5 through C7, pedicle screws to the level of T2. Posterior hardware is intact, no periprosthetic lucency. Partially incorporated posterior bone graft material. Multilevel mild degenerative discs. C1-2 articulation maintained with mild arthropathy. Osteopenia without destructive bony lesions. SOFT TISSUES AND SPINAL CANAL: Nonacute. Moderate calcific  atherosclerosis of LEFT carotid bulb. DISC LEVELS: Mild canal stenosis C3-4. C3-4 severe neural foraminal narrowing, moderate to severe at C5-6, severe at C6-7. UPPER CHEST: Lung apices are clear. OTHER: None. IMPRESSION: CT HEAD: Large LEFT frontal scalp hematoma.  No skull fracture. No acute intracranial process; negative CT HEAD for age. CT CERVICAL SPINE: No acute fracture or malalignment. Extensive cervical thoracic spinal hardware without hardware failure. Electronically Signed   By: Elon Alas M.D.   On: 09/22/2016 01:02   Dg Shoulder Left  Result Date: 09/22/2016 CLINICAL DATA:  [place] on mass.  Left shoulder hurts. EXAM: LEFT SHOULDER - 2+ VIEW COMPARISON:  None. FINDINGS: Examination is limited by patient positioning. Partially visualized sternal wires. Atherosclerotic vascular disease of the aorta. There is stent material at the aortic arch. There is  no acute fracture or dislocation. Marked degenerative changes of the left shoulder with effacement of the sub acromial space. Probable old fracture of the left sixth rib. There is partially visualized hardware within the cervical spine. IMPRESSION: 1. No acute osseous abnormality allowing for suboptimal positioning. 2. Probable old fracture of the left sixth rib. Electronically Signed   By: Donavan Foil M.D.   On: 09/22/2016 00:54   Dg Hip Unilat W Or Wo Pelvis 2-3 Views Right  Result Date: 09/22/2016 CLINICAL DATA:  Fall, head injury, hip pain EXAM: DG HIP (WITH OR WITHOUT PELVIS) 2-3V RIGHT COMPARISON:  CT 07/11/2016 FINDINGS: There are old fracture deformities of the right inferior and superior pubic rami. No definite acute displaced fracture or dislocation is visualized. There is SI joint arthritic change. Prominent bone spur off of the right iliac bone. Vascular calcifications. Degenerative changes of the lumbar spine. IMPRESSION: 1. No definite acute osseous abnormality. CT follow-up may be performed continued suspicion for hip fracture.  2. Old right superior and inferior pubic rami fractures. Electronically Signed   By: Donavan Foil M.D.   On: 09/22/2016 00:58     EKG: Independently reviewed. Atrial fibrillation/flutter, QTC 472, no ischemic change. Assessment/Plan Principal Problem:   Syncope Active Problems:   CAD (coronary artery disease)   Fall   Chronic systolic congestive heart failure (HCC)   Hyperlipidemia   Hypertension   Hyperkalemia   Laceration of scalp without foreign body   Atrial fibrillation (HCC)   Diabetes mellitus without complication ([place])   Syncope and fall: Etiology is not clear, but likely due to combination of peripheral neuropathy and bradycardia. Her heart rate was down to 37. No focal neurological findings on physical examination, less likely to have stroke though we cannot completely rule out this possibility. Troponin negative, no chest pain, less likely to have PE or ACS. Daughter would like to hold MRI of the brain.  -will place on tele bed for obs -check orthostatic status -IV fluid: 500 mL of normal saline, then 75 mL per hour -PT/OT -Frequent neuro check -Hold Coreg and Eliquis  Atrial Fibrillation: CHA2DS2-VASc Score is 7, needs oral anticoagulation. Patient is on Eliquis at home. Pt has bradycardia. -hold coreg -hold Eliquis. Due to her frequent fall, I think Eliquis should be discontinued -May switch to aspirin at discharge  CAD: s/p of CABG. No CP. -continue zocor  -hold coreg due to bradycarida  Hyperkalemia: K=5.4, no T wave peaking/ -Kayexalate 30 g 1  HLD:  -Continue home medications: Zocor  Chronic systolic congestive heart failure ([place]): 2-D echo on 05/22/16 showed EF 45%. Patient does not have leg edema or JVD. She COMPENSATED. Patient is not taking diuretics at home. -Watch volume status closely -Hold Coreg as above  Hypertension:  -Hold Coreg as above -Continue lisinopril  DM-II: Last A1c not on recorded. Patient is taking metformin at  home -SSI  Laceration of scalp without foreign body: Was sutured up. No active bleeding -Wound care consult   DVT ppx: SCD Code Status: Full code Family Communication: yes, patient's daugher at bed side Disposition Plan:  Anticipate discharge back to previous SNF environment Consults called:  none Admission status: Obs / tele    Date of Service 09/22/2016    Ivor Costa [facility] Pager [phone]  If 7PM-7AM, please contact night-coverage [email or web address] Password [email or web address] 09/22/2016, 7:17 AM

## 2016-09-22 NOTE — Progress Notes (Signed)
PROGRESS NOTE    Jessica King  FKC:[id number] DOB: 14-May-1932 DOA: 09/21/2016 PCP: Mathews Argyle, MD   Chief Complaint  Patient presents with  . Fall  . Head Laceration     Brief Narrative:  HPI on 09/22/2016 by Dr. Ivor Costa Jessica King is a 82 y.o. female with medical history significant of hypertension, hyperlipidemia, diabetes mellitus, PVD, iron deficiency anemia, CAD,s/p of CABG, s/p of stent placement, atrial fibrillation on Eliquis, who presents with fall and syncope.  Per patient's daughter, patient had multiple falls recently, 3 times in this week. She had fall again today. Upon triage, pt states that she "does not remember this fall". Thinks she may have "blacked out". Pt sustained head injury with skin tear in left frontal area. She also has left shoulder and right hip pain. Patient denies prodromal symptoms, no unilateral weakness, numbness or tingling seen in extremities, no dizziness, chest pain, palpitation, lightheadedness. No vision change. Patient states that recently she seems to have mild hearing loss. Patient states that sometimes she has a diarrhea recently, but no diarrhea today. Denies nausea, vomiting, abdominal pain, symptoms of  UTI. She states that she has bilateral peripheral leg neuropathy, making her feeling weak and difficulty walking. Assessment & Plan   Syncope and fall -Etiology unclear however may be combination of bradycardia noted in the ER upon admission, versus neuropathy -Cardiology consulted and appreciated, may need Holter monitor -PT and OT consulted -CT head: Large left frontal scalp hematoma, no skull fracture, no acute intracranial process. -CT cervical spine: No acute fracture or malalignment -Spoke with patient's daughter, no need for MRI at this time as patient does not have any neurological deficits. -Coreg held  Atrial fibrillation -CHADSVASC 7 -Coreg and Eliquis currently held -Patient has been falling more frequently.  Spoke with patient's daughter regarding holding Eliquis indefinitely given that she is a fall risk. Patient's daughter understood the risks versus benefits of holding Coreg.  Coronary artery disease -Status post CABG -Currently no chest pain -Continue statin -May consider aspirin  Hyperkalemia -Resolved, continue to monitor BMP  Hyperlipidemia -Continue statin  Chronic systolic heart failure -Patient appears to be currently euvolemic and compensated -Was not on diuretics at home -Echocardiogram 05/22/2016 showed an EF of 45%  Essential hypertension -Continue lisinopril, Coreg held due to bradycardia  Diabetes mellitus, type II -Metformin currently held -Continue insulin complaints count CBG monitoring  Laceration of scalp -Secondary to fall -Wound care consulted -Currently sutured  DVT Prophylaxis  SCDs  Code Status: Full  Family Communication: None bedside. Spoke with daughter via phone  Disposition Plan: Currently observation. Pending PT OT and recommendations of cardiology  Consultants Cardiology  Procedures  None  Antibiotics   Anti-infectives    None      Subjective:   Jessica King seen and examined today.  Patient states she feels very weak today. Does not recall the events that occurred just today when she fell. States she's been falling more recently. Patient currently denies any chest pain, shortness of breath, abdominal pain, nausea or vomiting.  Objective:   Vitals:   09/22/16 0130 09/22/16 0152 09/22/16 0520 09/22/16 0801  BP: (!) 123/43 123/63  136/77  Pulse: (!) 53 77  79  Resp:  (!) 29  20  Temp:   97.8 F (36.6 C) 97.9 F (36.6 C)  TempSrc:   Oral Oral  SpO2: 97% 99% 99% 98%    Intake/Output Summary (Last 24 hours) at 09/22/16 1242 Last data filed at 09/22/16 0600  Gross  per 24 hour  Intake            382.5 ml  Output                0 ml  Net            382.5 ml   There were no vitals filed for this  visit.  Exam  General: Well developed, well nourished, NAD, appears stated age  47: Jessica King, Left frontal hematoma, sutures in place, PERRLA, EOMI, Anicteic Sclera, mucous membranes moist.   Neck: Supple, no JVD, no masses  Cardiovascular: S1 S2 auscultated, irregular, no murmur  Respiratory: Clear to auscultation bilaterally with equal chest rise  Abdomen: Soft, nontender, nondistended, + bowel sounds  Extremities: warm dry without cyanosis clubbing or edema  Neuro: AAOx3, nonfocal  Psych: Normal affect and demeanor with intact judgement and insight   Data Reviewed: I have personally reviewed following labs and imaging studies  CBC:  Recent Labs Lab 09/21/16 2332 09/22/16 0417  WBC 9.7 8.7  NEUTROABS 5.8  --   HGB 10.6* 10.0*  HCT 32.5* 31.3*  MCV 97.6 98.1  PLT 205 993   Basic Metabolic Panel:  Recent Labs Lab 09/21/16 2332 09/22/16 0417  NA 138 138  K 5.4* 4.4  CL 102 103  CO2 28 26  GLUCOSE 144* 142*  BUN 22* 20  CREATININE 0.99 0.89  CALCIUM 9.5 9.0   GFR: CrCl cannot be calculated (Unknown ideal weight.). Liver Function Tests: No results for input(s): AST, ALT, ALKPHOS, BILITOT, PROT, ALBUMIN in the last 168 hours. No results for input(s): LIPASE, AMYLASE in the last 168 hours. No results for input(s): AMMONIA in the last 168 hours. Coagulation Profile:  Recent Labs Lab 09/21/16 2332  INR 1.17   Cardiac Enzymes:  Recent Labs Lab 09/21/16 2332  TROPONINI <0.03   BNP (last 3 results) No results for input(s): PROBNP in the last 8760 hours. HbA1C: No results for input(s): HGBA1C in the last 72 hours. CBG:  Recent Labs Lab 09/22/16 0554 09/22/16 1135  GLUCAP 124* 204*   Lipid Profile: No results for input(s): CHOL, HDL, LDLCALC, TRIG, CHOLHDL, LDLDIRECT in the last 72 hours. Thyroid Function Tests: No results for input(s): TSH, T4TOTAL, FREET4, T3FREE, THYROIDAB in the last 72 hours. Anemia Panel: No results for input(s):  VITAMINB12, FOLATE, FERRITIN, TIBC, IRON, RETICCTPCT in the last 72 hours. Urine analysis:    Component Value Date/Time   COLORURINE YELLOW 09/21/2016 0217   APPEARANCEUR CLEAR 09/21/2016 0217   LABSPEC 1.014 09/21/2016 0217   PHURINE 7.0 09/21/2016 0217   GLUCOSEU NEGATIVE 09/21/2016 0217   HGBUR NEGATIVE 09/21/2016 0217   BILIRUBINUR NEGATIVE 09/21/2016 0217   KETONESUR NEGATIVE 09/21/2016 0217   PROTEINUR NEGATIVE 09/21/2016 0217   NITRITE NEGATIVE 09/21/2016 0217   LEUKOCYTESUR TRACE (A) 09/21/2016 0217   Sepsis Labs: @LABRCNTIP (procalcitonin:4,lacticidven:4)  )No results found for this or any previous visit (from the past 240 hour(s)).    Radiology Studies: Ct Head Wo Contrast  Result Date: 09/22/2016 CLINICAL DATA:  Syncopal episode in bathroom, struck LEFT head, large hematoma. Patient does not recall event. Neck pain. History of hypertension, hyperlipidemia and diabetes. EXAM: CT HEAD WITHOUT CONTRAST CT CERVICAL SPINE WITHOUT CONTRAST TECHNIQUE: Multidetector CT imaging of the head and cervical spine was performed following the standard protocol without intravenous contrast. Multiplanar CT image reconstructions of the cervical spine were also generated. COMPARISON:  CT HEAD and cervical spine July 11, 2016 FINDINGS: CT HEAD FINDINGS BRAIN: The ventricles  and sulci are normal for age. No intraparenchymal hemorrhage, mass effect nor midline shift. Patchy supratentorial white matter hypodensities less than expected for patient's age, though non-specific are most compatible with chronic small vessel ischemic disease. No acute large vascular territory infarcts. No abnormal extra-axial fluid collections. Basal cisterns are patent. VASCULAR: Moderate to severe calcific atherosclerosis of the carotid siphons. SKULL: No skull fracture. Large LEFT frontal scalp hematoma without subcutaneous gas or radiopaque foreign bodies. SINUSES/ORBITS: The mastoid air-cells and included paranasal  sinuses are well-aerated. Status post bilateral ocular lens implants. The included ocular globes and orbital contents are non-suspicious. OTHER: None. CT CERVICAL SPINE FINDINGS ALIGNMENT: Vertebral bodies in alignment.  Straightened lordosis. SKULL BASE AND VERTEBRAE: No acute fracture. Old C2 fracture with intact screw, no periprosthetic lucency. Status post C5 through C7 ACDF, incorporated interbody strut. Facet screws C5 through C7, pedicle screws to the level of T2. Posterior hardware is intact, no periprosthetic lucency. Partially incorporated posterior bone graft material. Multilevel mild degenerative discs. C1-2 articulation maintained with mild arthropathy. Osteopenia without destructive bony lesions. SOFT TISSUES AND SPINAL CANAL: Nonacute. Moderate calcific atherosclerosis of LEFT carotid bulb. DISC LEVELS: Mild canal stenosis C3-4. C3-4 severe neural foraminal narrowing, moderate to severe at C5-6, severe at C6-7. UPPER CHEST: Lung apices are clear. OTHER: None. IMPRESSION: CT HEAD: Large LEFT frontal scalp hematoma.  No skull fracture. No acute intracranial process; negative CT HEAD for age. CT CERVICAL SPINE: No acute fracture or malalignment. Extensive cervical thoracic spinal hardware without hardware failure. Electronically Signed   By: Elon Alas M.D.   On: 09/22/2016 01:02   Ct Cervical Spine Wo Contrast  Result Date: 09/22/2016 CLINICAL DATA:  Syncopal episode in bathroom, struck LEFT head, large hematoma. Patient does not recall event. Neck pain. History of hypertension, hyperlipidemia and diabetes. EXAM: CT HEAD WITHOUT CONTRAST CT CERVICAL SPINE WITHOUT CONTRAST TECHNIQUE: Multidetector CT imaging of the head and cervical spine was performed following the standard protocol without intravenous contrast. Multiplanar CT image reconstructions of the cervical spine were also generated. COMPARISON:  CT HEAD and cervical spine July 11, 2016 FINDINGS: CT HEAD FINDINGS BRAIN: The  ventricles and sulci are normal for age. No intraparenchymal hemorrhage, mass effect nor midline shift. Patchy supratentorial white matter hypodensities less than expected for patient's age, though non-specific are most compatible with chronic small vessel ischemic disease. No acute large vascular territory infarcts. No abnormal extra-axial fluid collections. Basal cisterns are patent. VASCULAR: Moderate to severe calcific atherosclerosis of the carotid siphons. SKULL: No skull fracture. Large LEFT frontal scalp hematoma without subcutaneous gas or radiopaque foreign bodies. SINUSES/ORBITS: The mastoid air-cells and included paranasal sinuses are well-aerated. Status post bilateral ocular lens implants. The included ocular globes and orbital contents are non-suspicious. OTHER: None. CT CERVICAL SPINE FINDINGS ALIGNMENT: Vertebral bodies in alignment.  Straightened lordosis. SKULL BASE AND VERTEBRAE: No acute fracture. Old C2 fracture with intact screw, no periprosthetic lucency. Status post C5 through C7 ACDF, incorporated interbody strut. Facet screws C5 through C7, pedicle screws to the level of T2. Posterior hardware is intact, no periprosthetic lucency. Partially incorporated posterior bone graft material. Multilevel mild degenerative discs. C1-2 articulation maintained with mild arthropathy. Osteopenia without destructive bony lesions. SOFT TISSUES AND SPINAL CANAL: Nonacute. Moderate calcific atherosclerosis of LEFT carotid bulb. DISC LEVELS: Mild canal stenosis C3-4. C3-4 severe neural foraminal narrowing, moderate to severe at C5-6, severe at C6-7. UPPER CHEST: Lung apices are clear. OTHER: None. IMPRESSION: CT HEAD: Large LEFT frontal scalp hematoma.  No skull fracture.  No acute intracranial process; negative CT HEAD for age. CT CERVICAL SPINE: No acute fracture or malalignment. Extensive cervical thoracic spinal hardware without hardware failure. Electronically Signed   By: Elon Alas M.D.   On:  09/22/2016 01:02   Dg Shoulder Left  Result Date: 09/22/2016 CLINICAL DATA:  [place] on mass.  Left shoulder hurts. EXAM: LEFT SHOULDER - 2+ VIEW COMPARISON:  None. FINDINGS: Examination is limited by patient positioning. Partially visualized sternal wires. Atherosclerotic vascular disease of the aorta. There is stent material at the aortic arch. There is no acute fracture or dislocation. Marked degenerative changes of the left shoulder with effacement of the sub acromial space. Probable old fracture of the left sixth rib. There is partially visualized hardware within the cervical spine. IMPRESSION: 1. No acute osseous abnormality allowing for suboptimal positioning. 2. Probable old fracture of the left sixth rib. Electronically Signed   By: Donavan Foil M.D.   On: 09/22/2016 00:54   Dg Hip Unilat W Or Wo Pelvis 2-3 Views Right  Result Date: 09/22/2016 CLINICAL DATA:  Fall, head injury, hip pain EXAM: DG HIP (WITH OR WITHOUT PELVIS) 2-3V RIGHT COMPARISON:  CT 07/11/2016 FINDINGS: There are old fracture deformities of the right inferior and superior pubic rami. No definite acute displaced fracture or dislocation is visualized. There is SI joint arthritic change. Prominent bone spur off of the right iliac bone. Vascular calcifications. Degenerative changes of the lumbar spine. IMPRESSION: 1. No definite acute osseous abnormality. CT follow-up may be performed continued suspicion for hip fracture. 2. Old right superior and inferior pubic rami fractures. Electronically Signed   By: Donavan Foil M.D.   On: 09/22/2016 00:58     Scheduled Meds: . clonazePAM  0.5 mg Oral BID  . gabapentin  300 mg Oral 2 times per day  . gabapentin  600 mg Oral QHS  . insulin aspart  0-5 Units Subcutaneous QHS  . insulin aspart  0-9 Units Subcutaneous TID WC  . lisinopril  2.5 mg Oral Daily  . multivitamin with minerals  1 tablet Oral Daily  . PARoxetine  20 mg Oral Daily  . simvastatin  40 mg Oral q1800  . sodium  chloride flush  3 mL Intravenous Q12H   Continuous Infusions: . sodium chloride 1,000 mL (09/22/16 0230)     LOS: 0 days   Time Spent in minutes   30 minutes  Shavonn Convey D.O. on 09/22/2016 at 12:42 PM  Between 7am to 7pm - Pager - [phone]  After 7pm go to [email or web address] - password [email or web address]  And look for the night coverage person covering for me after hours  [facility] Office  [phone]

## 2016-09-22 NOTE — Consult Note (Signed)
ELECTROPHYSIOLOGY CONSULT NOTE    Patient ID: Jessica King MRN: [id number], DOB/AGE: 80-Jun-1933 80 y.o.  Admit date: 09/21/2016 Date of Consult: 09/22/2016  Primary Physician: Mathews Argyle, MD Primary Cardiologist: Percival Spanish Requesting MD: Meda Coffee  Reason for Consultation: atrial arrhythmias and falls  HPI:  Jessica King is a 82 y.o. female with a past medical history significant for diabetes with associated neuropathy, CAD, hypertension, hyperlipidemia, PVD, carotid artery disease, and LV dysfunction with frequent falls. She resides in a SNF and has at least weekly falls that she attributes to peripheral neuropathy.  She typically rides in a wheelchair for transportation.  She was taking some towels to her bathroom and so was pushing the wheelchair with the towels in the chair. She remembers putting the non slip pad in the tub and laying another towel down on the floor.  She doesn't remember anything after that when I talk to her today.  Per EMS records, she told them that she tripped while putting the towels away and hit her head.  When EMS arrived, her head laceration was dressed and HR was recorded as 68bpm with normal blood pressure.  In the ER, there is a pulse ox recording of a heart rate in the 30's but no telemetry strips are available. All telemetry strips that are available show heart rates between 60-110.  She currently states that she "hurts all over".  She is asking for pain medication for her neuropathy.  She denies chest pain, shortness of breath, LE edema, recent fevers or chills.    Echo 05/2016 demonstrated EF 45%, diffuse hypokinesis, LA severely dilated, mild to moderate MR.   EP has been asked to evaluate.   Past Medical History:  Diagnosis Date  . Abdominal fibromatosis   . Allergic rhinitis   . CAD (coronary artery disease)   . Chronic systolic congestive heart failure ([place])   . Coronary artery disease    Cardiac cath 1999, 2007, 2010, Taxus stent x 2 to  LAD 2007.   . Diabetes mellitus without complication ([place])   . Diverticulosis   . Dyspnea   . Hyperlipidemia   . Hypertension   . Iron deficiency anemia   . Peripheral neuropathy ([facility])    Frequent fall  . PVD (peripheral vascular disease) ([place])    Subclavian stent May 2013 (subclavian steal)     Surgical History:  Past Surgical History:  Procedure Laterality Date  . BACK SURGERY     x 3 or 4 lumbar, cervical x 2  . CEA    . CORONARY ARTERY BYPASS GRAFT  2011   x 3   . Coronary Stents x3       Prescriptions Prior to Admission  Medication Sig Dispense Refill Last Dose  . apixaban (ELIQUIS) 2.5 MG TABS tablet Take 2.5 mg by mouth 2 (two) times daily.   09/21/2016 at 2000  . Ca Carbonate-Mag Hydroxide (ROLAIDS) 550-110 MG CHEW Chew 1 tablet by mouth as needed (for heart burn).   unknown  . carvedilol (COREG) 3.125 MG tablet Take 3.125 mg by mouth 2 (two) times daily with a meal.   09/21/2016 at 2000  . clonazePAM (KLONOPIN) 0.5 MG tablet Take 0.5 mg by mouth 2 (two) times daily. Takes 1 tab in daytime as needed and 1 tab every bedtime   09/21/2016 at Unknown time  . CRANBERRY PO Take 4,200 mg by mouth daily.   09/21/2016 at Unknown time  . gabapentin (NEURONTIN) 300 MG capsule Take 300-600 mg by mouth See  admin instructions. Take 1 capsule at 8 am and 12 pm and then take 2 capsules at bedtime   09/21/2016 at Unknown time  . HYDROcodone-acetaminophen (NORCO) 7.5-325 MG per tablet Take 1 tablet by mouth every 6 (six) hours as needed for moderate pain.    unknown  . lisinopril (PRINIVIL,ZESTRIL) 2.5 MG tablet Take 2.5 mg by mouth daily.   09/21/2016 at Unknown time  . loratadine (CLARITIN) 10 MG tablet Take 10 mg by mouth daily as needed for allergies.    unknown  . metFORMIN (GLUCOPHAGE) 1000 MG tablet Take 1,000 mg by mouth 2 (two) times daily with a meal.   09/21/2016 at Unknown time  . methocarbamol (ROBAXIN) 750 MG tablet Take 750 mg by mouth every 6 (six) hours as needed for muscle  spasms.    unknown  . Multiple Vitamin (MULTIVITAMIN WITH MINERALS) TABS tablet Take 1 tablet by mouth daily.   09/21/2016 at Unknown time  . PARoxetine (PAXIL) 20 MG tablet Take 20 mg by mouth daily.   09/21/2016 at Unknown time  . simvastatin (ZOCOR) 40 MG tablet Take 40 mg by mouth daily.   09/21/2016 at Unknown time  . zolpidem (AMBIEN) 5 MG tablet Take 10 mg by mouth at bedtime as needed for sleep.    unknown    Inpatient Medications:  . clonazePAM  0.5 mg Oral BID  . gabapentin  300 mg Oral 2 times per day  . gabapentin  600 mg Oral QHS  . insulin aspart  0-5 Units Subcutaneous QHS  . insulin aspart  0-9 Units Subcutaneous TID WC  . lisinopril  2.5 mg Oral Daily  . multivitamin with minerals  1 tablet Oral Daily  . PARoxetine  20 mg Oral Daily  . simvastatin  40 mg Oral q1800  . sodium chloride flush  3 mL Intravenous Q12H    Allergies:  Allergies  Allergen Reactions  . Sulfa Antibiotics Swelling and Other (See Comments)    Maybe other reactions, unknown specifically    Social History   Social History  . Marital status: Married    Spouse name: N/A  . Number of children: 4  . Years of education: N/A   Occupational History  . Not on file.   Social History Main Topics  . Smoking status: Never Smoker  . Smokeless tobacco: Never Used  . Alcohol use No  . Drug use: Unknown  . Sexual activity: Not on file   Other Topics Concern  . Not on file   Social History Narrative   Retired here because of children.  Lived in [place].       Family History  Problem Relation Age of Onset  . Hypertension Mother   . Heart disease Mother     No details.  Died at age 37  . Diabetes Father   . Heart attack Brother 36     Review of Systems: All other systems reviewed and are otherwise negative except as noted above.  Physical Exam: Vitals:   09/22/16 0130 09/22/16 0152 09/22/16 0520 09/22/16 0801  BP: (!) 123/43 123/63  136/77  Pulse: (!) 53 77  79  Resp:  (!) 29  20    Temp:   97.8 F (36.6 C) 97.9 F (36.6 C)  TempSrc:   Oral Oral  SpO2: 97% 99% 99% 98%    GEN- The patient is elderly appearing, alert and oriented x 3 today.   HEENT: normocephalic, atraumatic; sclera clear, conjunctiva pink; hearing intact; oropharynx clear; neck  supple  Lungs- Clear to ausculation bilaterally, normal work of breathing.  No wheezes, rales, rhonchi Heart- Irregular rate and rhythm  GI- soft, non-tender, non-distended, bowel sounds present  Extremities- no clubbing, cyanosis, or edema  MS- no significant deformity or atrophy Skin- warm and dry, no rash or lesion Psych- euthymic mood, full affect Neuro- strength and sensation are intact  Labs:   Lab Results  Component Value Date   WBC 8.7 09/22/2016   HGB 10.0 (L) 09/22/2016   HCT 31.3 (L) 09/22/2016   MCV 98.1 09/22/2016   PLT 198 09/22/2016    Recent Labs Lab 09/22/16 0417  NA 138  K 4.4  CL 103  CO2 26  BUN 20  CREATININE 0.89  CALCIUM 9.0  GLUCOSE 142*      Radiology/Studies: Ct Head Wo Contrast Result Date: 09/22/2016 CLINICAL DATA:  Syncopal episode in bathroom, struck LEFT head, large hematoma. Patient does not recall event. Neck pain. History of hypertension, hyperlipidemia and diabetes. EXAM: CT HEAD WITHOUT CONTRAST CT CERVICAL SPINE WITHOUT CONTRAST TECHNIQUE: Multidetector CT imaging of the head and cervical spine was performed following the standard protocol without intravenous contrast. Multiplanar CT image reconstructions of the cervical spine were also generated. COMPARISON:  CT HEAD and cervical spine July 11, 2016 FINDINGS: CT HEAD FINDINGS BRAIN: The ventricles and sulci are normal for age. No intraparenchymal hemorrhage, mass effect nor midline shift. Patchy supratentorial white matter hypodensities less than expected for patient's age, though non-specific are most compatible with chronic small vessel ischemic disease. No acute large vascular territory infarcts. No abnormal  extra-axial fluid collections. Basal cisterns are patent. VASCULAR: Moderate to severe calcific atherosclerosis of the carotid siphons. SKULL: No skull fracture. Large LEFT frontal scalp hematoma without subcutaneous gas or radiopaque foreign bodies. SINUSES/ORBITS: The mastoid air-cells and included paranasal sinuses are well-aerated. Status post bilateral ocular lens implants. The included ocular globes and orbital contents are non-suspicious. OTHER: None. CT CERVICAL SPINE FINDINGS ALIGNMENT: Vertebral bodies in alignment.  Straightened lordosis. SKULL BASE AND VERTEBRAE: No acute fracture. Old C2 fracture with intact screw, no periprosthetic lucency. Status post C5 through C7 ACDF, incorporated interbody strut. Facet screws C5 through C7, pedicle screws to the level of T2. Posterior hardware is intact, no periprosthetic lucency. Partially incorporated posterior bone graft material. Multilevel mild degenerative discs. C1-2 articulation maintained with mild arthropathy. Osteopenia without destructive bony lesions. SOFT TISSUES AND SPINAL CANAL: Nonacute. Moderate calcific atherosclerosis of LEFT carotid bulb. DISC LEVELS: Mild canal stenosis C3-4. C3-4 severe neural foraminal narrowing, moderate to severe at C5-6, severe at C6-7. UPPER CHEST: Lung apices are clear. OTHER: None. IMPRESSION: CT HEAD: Large LEFT frontal scalp hematoma.  No skull fracture. No acute intracranial process; negative CT HEAD for age. CT CERVICAL SPINE: No acute fracture or malalignment. Extensive cervical thoracic spinal hardware without hardware failure. Electronically Signed   By: Elon Alas M.D.   On: 09/22/2016 01:02   MHD:[id number] atrial fibrillation, rate 81  TELEMETRY: atrial fibrillation/flutter, rate 60-110's  Assessment/Plan: 1.  Fall The patient had a fall for which she does not remember the circumstances around when asked today. According to EMS records, she told them that she had tripped while putting up towels  and hit her head.  Vitals at that time per their records were stable with heart rates in the 60's. ER records describe heart rates in the 30's.  Unfortunately, we do not have EKG or telemetry to document this.   Could consider event monitor although I am not  sure she would be able to handle leads and transmissions. Could also consider ILR but the patient is clear that she would like to avoid procedures.  Dr Curt Bears to see later today   2.  Atrial fibrillation/flutter Was on Eliquis at home for [facility] of [address] defer to primary team if risks of Eliquis outweigh benefits with frequent falls V rates controlled  3.  Hyperkalemia Present on presentation to ER Resolved with Kayexalate  4.  CAD No recent ischemic symptoms  5.  HTN Stable No change required today   Signed, Chanetta Marshall, NP 09/22/2016 3:17 PM  I have seen and examined this patient with Chanetta Marshall.  Agree with above, note added to reflect my findings.  On exam, regular rhythm, no murmurs, lungs clear. Had fall with possible bradycardia while in the ER. In talking with the patient, does not wish to have device placement unless certain Natanel Snavely help.   Thus would recommend 30 day monitor to evaluate for bradycardic causes of her fall. Has agreed that if evidence of bradycardia on monitor would be willing to have pacemaker placed.  Abdulai Blaylock M. Remberto Lienhard MD 09/22/2016 9:39 PM

## 2016-09-22 NOTE — Progress Notes (Signed)
Patient arrived from [facility] to 22E24. Admit with Fall/observation. Patient alert to person and place only. Stitched laceration to left forehead. A flutter on telemetry. High fall risk initiated. No skin break down noted.

## 2016-09-22 NOTE — Evaluation (Signed)
Occupational Therapy Evaluation Patient Details Name: Jessica King MRN: [id number] DOB: February 14, 1932 Today's Date: 09/22/2016    History of Present Illness 82 y.o. female with medical history significant of hypertension, hyperlipidemia, diabetes mellitus, PVD, iron deficiency anemia, CAD,s/p of CABG, s/p of stent placement, atrial fibrillation on Eliquis, who presents with fall and syncope. Lt forehead laceration; xrays negative for fracture; pt does not recall what happened "just blaced out."   Clinical Impression   Pt. Stated that she uses AE at home for LE ADLs. Pt. Is motivated to work with OT to increase I with ADLs and return to ALF. Pt. Was able to follow directions and ws oriented to place, person and date.     Follow Up Recommendations  Home health OT    Equipment Recommendations  None recommended by OT    Recommendations for Other Services       Precautions / Restrictions Precautions Precautions: Fall Restrictions Weight Bearing Restrictions: No      Mobility Bed Mobility Overal bed mobility: Modified Independent             General bed mobility comments: supine to sit with rail; sit to supine no rail  Transfers Overall transfer level: Needs assistance Equipment used: Rolling walker (2 wheeled) Transfers: Sit to/from [facility] Sit to Stand: Min guard Stand pivot transfers: Min guard       General transfer comment: cues for safety    Balance Overall balance assessment: History of Falls;Needs assistance Sitting-balance support: No upper extremity supported;Feet supported Sitting balance-Leahy Scale: Fair     Standing balance support: Bilateral upper extremity supported Standing balance-Leahy Scale: Poor                              ADL Overall ADL's : Needs assistance/impaired Eating/Feeding: Independent   Grooming: Wash/dry hands;Wash/dry face;Supervision/safety;Standing   Upper Body Bathing: Set up;Sitting    Lower Body Bathing: Minimal assistance;Sit to/from stand   Upper Body Dressing : Set up;Sitting   Lower Body Dressing: Minimal assistance;With adaptive equipment   Toilet Transfer: Min guard;Ambulation;Comfort height toilet   Toileting- Clothing Manipulation and Hygiene: Min guard;Sit to/from stand       Functional mobility during ADLs: Min guard;Rolling walker General ADL Comments:  (Pt. requires cues for safety with AMB with walekr. )     Vision Vision Assessment?: Yes Eye Alignment: Within Functional Limits   Perception     Praxis      Pertinent Vitals/Pain Pain Assessment: 0-10 Pain Score: 5  Faces Pain Scale: Hurts little more Pain Location:  (back, SHLD B, neck and head) Pain Descriptors / Indicators: Aching;Discomfort Pain Intervention(s): Limited activity within patient's tolerance     Hand Dominance Right   Extremity/Trunk Assessment Upper Extremity Assessment Upper Extremity Assessment: RUE deficits/detail;LUE deficits/detail RUE Deficits / Details:   (R S HLD 70 degrees flex and distal is WFL.) LUE Deficits / Details:  (L SHLD 50 degrees and distal is WFL)   Lower Extremity Assessment Lower Extremity Assessment: Generalized weakness;RLE deficits/detail;LLE deficits/detail RLE Deficits / Details: Rt great toe extension 0/5; DF 5/5 RLE Sensation: history of peripheral neuropathy (up to her knees ) LLE Sensation: history of peripheral neuropathy (up to her knees)       Communication Communication Communication: No difficulties   Cognition Arousal/Alertness: Awake/alert Behavior During Therapy: WFL for tasks assessed/performed Overall Cognitive Status: No family/caregiver present to determine baseline cognitive functioning       Memory:  Decreased short-term memory (could not remember name of facility where she lives)             General Comments       Exercises Exercises: General Lower Extremity     Shoulder Instructions      Home  Living Family/patient expects to be discharged to:: Assisted living                             Home Equipment: Walker - 4 wheels;Wheelchair - manual          Prior Functioning/Environment Level of Independence: Needs assistance  Gait / Transfers Assistance Needed: independent with wheelchair (began using due to falls, per pt); undergoing HHPT for gait training and using a rollator ADL's / Homemaking Assistance Needed: assist by staff            OT Problem List: Decreased strength;Decreased activity tolerance;Decreased knowledge of use of DME or AE;Pain   OT Treatment/Interventions:      OT Goals(Current goals can be found in the care plan section) Acute Rehab OT Goals Patient Stated Goal:  (go home) OT Goal Formulation: With patient Time For Goal Achievement: 10/06/16 Potential to Achieve Goals: Good ADL Goals Pt Will Perform Grooming: with modified independence;standing Pt Will Perform Upper Body Bathing: with modified independence;sitting Pt Will Perform Lower Body Bathing: with modified independence;sit to/from stand Pt Will Perform Upper Body Dressing: with modified independence;sitting Pt Will Perform Lower Body Dressing: with modified independence;sit to/from stand;with adaptive equipment Pt Will Transfer to Toilet: with modified independence;bedside commode Pt Will Perform Toileting - Clothing Manipulation and hygiene: with modified independence;sit to/from stand Pt Will Perform Tub/Shower Transfer: Shower transfer;with supervision;rolling walker;shower seat  OT Frequency: Min 2X/week   Barriers to D/C:            Co-evaluation              End of Session Equipment Utilized During Treatment: Gait belt;Rolling walker  Activity Tolerance:   Patient left: in chair;with call bell/phone within reach;with chair alarm set   Time: 8676-1950 OT Time Calculation (min): 44 min Charges:  OT General Charges $OT Visit: 1 Procedure OT Evaluation $OT  Eval Low Complexity: 1 Procedure OT Treatments $Self Care/Home Management : 23-37 mins G-Codes: OT G-codes **NOT FOR INPATIENT CLASS** Functional Limitation: Self care Self Care Current Status (D3267): At least 20 percent but less than 40 percent impaired, limited or restricted Self Care Goal Status (T2458): At least 1 percent but less than 20 percent impaired, limited or restricted  Kynlei Piontek 09/22/2016, 1:02 PM

## 2016-09-22 NOTE — Consult Note (Signed)
[facility] Nurse wound consult note Reason for Consult: scalp laceration Sustained during fall, repaired in ED-see note Wound type: laceration Pressure Ulcer POA:No Measurement:aprox. 3cm  Wound WNI:[id number] with sutured Drainage (amount, consistency, odor) some minimal sanguinous oozing Periwound: intact, dried blood Dressing procedure/placement/frequency: Dry dressing only  Discussed POC with patient and bedside nurse.  Re consult if needed, will not follow at this time. Thanks  Andriea Hasegawa [employer], RN,CNS, [place] [phone])

## 2016-09-22 NOTE — Consult Note (Addendum)
CARDIOLOGY CONSULT NOTE   Patient ID: Jessica King MRN: [id number] DOB/AGE: [birth year] 82 y.o.  Admit date: 09/21/2016  Requesting Physician: Dr. Ree Kida Primary Physician:   Mathews Argyle, MD Primary Cardiologist:  Dr. Percival Spanish  Reason for Consultation:  Syncope and bradycardia  HPI: [name] is a 82 y.o. female SNF patient with a history of CAD s/p previous PCIs and CABG (2011 in [place]), HTN, HLD, PVD s/p PCI subclavian artery (2013), carotid artery disease s/p remote CEA, DMT2 w/ peripheral neuropathy with frequent falls, LV dysfunction with no clinical CHF and persistent atrial fib/flutter on Eliquis who presented to [facility] on 09/21/16 with syncope.   She was seen by Dr. Percival Spanish in 05/2015 to establish cardiology care. She moved here from [place] to live permanently. She required cardiac clearance at that time and Dr. Percival Spanish ordered a nuclear stress test which showed normal LV function and normal perfusion. She was cleared for surgery. She has not been seen since that time.   Review of ECGs reveal that since 05/2015 she has developed persistent atrial flutter. 2D ECHO on 05/22/16 showed normal LV size with EF 45%, diffuse hypokinesis. Normal RV size with mildly decreased systolic function. Mild to moderate mitral regurgitation and biatrial enlargement. In atrial fib at the time of study. She has been placed on Eliquis.   She has been seen in ER in 05/2016 and 07/2016 for falls.   She was in her usual state of health until this past week, when per her daughter she fell 3x. She had a fall on 09/21/16 that resulted in a head injury with skin tear in left frontal area. Patient did not remember the fall upon arrival to ED.  In the emergency department patient was found to have bradycardia with heart rate down to 37, blood pressure 178/53, WBC 9.7, negative troponin, urinalysis trace a  mount of leukocytes, potassium of 5.4 without ECG changes, creatinine normal, temperature  normal, negative x-ray of left shoulder and right hip/pelvis. CT head and CT neck are negative for acute intracranial abnormalities or fracture.  She was given kayelexelate 30g x1 and potassium has normalized.   Currently resting comfortably in bed. No complaints. No chest pain or SOB. No dizziness or recurrent syncope> She doesn't remember anything surrounding the events of yesterday, just remembers waking up in hospital hall.   Past Medical History:  Diagnosis Date  . Abdominal fibromatosis   . Allergic rhinitis   . CAD (coronary artery disease)   . Chronic systolic congestive heart failure ([place])   . Coronary artery disease    Cardiac cath 1999, 2007, 2010, Taxus stent x 2 to LAD 2007.   . Diabetes mellitus without complication ([place])   . Diverticulosis   . Dyspnea   . Hyperlipidemia   . Hypertension   . Iron deficiency anemia   . Peripheral neuropathy (HCC)    Frequent fall  . PVD (peripheral vascular disease) ([place])    Subclavian stent May 2013 (subclavian steal)     Past Surgical History:  Procedure Laterality Date  . BACK SURGERY     x 3 or 4 lumbar, cervical x 2  . CEA    . CORONARY ARTERY BYPASS GRAFT  2011   x 3   . Coronary Stents x3      Allergies  Allergen Reactions  . Sulfa Antibiotics Swelling and Other (See Comments)    Maybe other reactions, unknown specifically    I have reviewed the patient's current medications .  clonazePAM  0.5 mg Oral BID  . gabapentin  300 mg Oral 2 times per day  . gabapentin  600 mg Oral QHS  . insulin aspart  0-5 Units Subcutaneous QHS  . insulin aspart  0-9 Units Subcutaneous TID WC  . lisinopril  2.5 mg Oral Daily  . multivitamin with minerals  1 tablet Oral Daily  . PARoxetine  20 mg Oral Daily  . simvastatin  40 mg Oral q1800  . sodium chloride flush  3 mL Intravenous Q12H   . sodium chloride 1,000 mL (09/22/16 0230)   acetaminophen **OR** acetaminophen, calcium carbonate, HYDROcodone-acetaminophen, loratadine,  methocarbamol, ondansetron **OR** ondansetron (ZOFRAN) IV, zolpidem  Prior to Admission medications   Medication Sig Start Date End Date Taking? Authorizing Provider  apixaban (ELIQUIS) 2.5 MG TABS tablet Take 2.5 mg by mouth 2 (two) times daily.   Yes Historical Provider, MD  Ca Carbonate-Mag Hydroxide (ROLAIDS) 550-110 MG CHEW Chew 1 tablet by mouth as needed (for heart burn).   Yes Historical Provider, MD  carvedilol (COREG) 3.125 MG tablet Take 3.125 mg by mouth 2 (two) times daily with a meal.   Yes Historical Provider, MD  clonazePAM (KLONOPIN) 0.5 MG tablet Take 0.5 mg by mouth 2 (two) times daily. Takes 1 tab in daytime as needed and 1 tab every bedtime   Yes Historical Provider, MD  CRANBERRY PO Take 4,200 mg by mouth daily.   Yes Historical Provider, MD  gabapentin (NEURONTIN) 300 MG capsule Take 300-600 mg by mouth See admin instructions. Take 1 capsule at 8 am and 12 pm and then take 2 capsules at bedtime   Yes Historical Provider, MD  HYDROcodone-acetaminophen (NORCO) 7.5-325 MG per tablet Take 1 tablet by mouth every 6 (six) hours as needed for moderate pain.    Yes Historical Provider, MD  lisinopril (PRINIVIL,ZESTRIL) 2.5 MG tablet Take 2.5 mg by mouth daily.   Yes Historical Provider, MD  loratadine (CLARITIN) 10 MG tablet Take 10 mg by mouth daily as needed for allergies.    Yes Historical Provider, MD  metFORMIN (GLUCOPHAGE) 1000 MG tablet Take 1,000 mg by mouth 2 (two) times daily with a meal.   Yes Historical Provider, MD  methocarbamol (ROBAXIN) 750 MG tablet Take 750 mg by mouth every 6 (six) hours as needed for muscle spasms.    Yes Historical Provider, MD  Multiple Vitamin (MULTIVITAMIN WITH MINERALS) TABS tablet Take 1 tablet by mouth daily.   Yes Historical Provider, MD  PARoxetine (PAXIL) 20 MG tablet Take 20 mg by mouth daily.   Yes Historical Provider, MD  simvastatin (ZOCOR) 40 MG tablet Take 40 mg by mouth daily.   Yes Historical Provider, MD  zolpidem (AMBIEN) 5 MG  tablet Take 10 mg by mouth at bedtime as needed for sleep.    Yes Historical Provider, MD     Social History   Social History  . Marital status: Married    Spouse name: N/A  . Number of children: 4  . Years of education: N/A   Occupational History  . Not on file.   Social History Main Topics  . Smoking status: Never Smoker  . Smokeless tobacco: Never Used  . Alcohol use No  . Drug use: Unknown  . Sexual activity: Not on file   Other Topics Concern  . Not on file   Social History Narrative   Retired here because of children.  Lived in [place].      Family Status  Relation Status  .  Mother Deceased  . Father Deceased  . Brother    Family History  Problem Relation Age of Onset  . Hypertension Mother   . Heart disease Mother     No details.  Died at age 56  . Diabetes Father   . Heart attack Brother 67    ROS:  Full 14 point review of systems complete and found to be negative unless listed above.  Physical Exam: Blood pressure 136/77, pulse 79, temperature 97.9 F (36.6 C), temperature source Oral, resp. rate 20, SpO2 98 %.  General: Well developed, well nourished, female in no acute distress Head: Eyes PERRLA, No xanthomas.   Normocephalic and atraumatic, oropharynx without edema or exudate.   Lungs: CTAB Heart: HRRR S1 S2, no rub/gallop, Heart irregular rate and rhythm with S1, S2  murmur. pulses are 2+ extrem.   Neck: No carotid bruits. No lymphadenopathy.  No JVD. Abdomen: Bowel sounds present, abdomen soft and non-tender without masses or hernias noted. Msk:  No spine or cva tenderness. No weakness, no joint deformities or effusions. Extremities: No clubbing or cyanosis.   Trace LE edema.  Neuro: Alert and oriented X 3. No focal deficits noted. Psych:  Good affect, responds appropriately Skin: No rashes or lesions noted.  Labs:   Lab Results  Component Value Date   WBC 8.7 09/22/2016   HGB 10.0 (L) 09/22/2016   HCT 31.3 (L) 09/22/2016   MCV 98.1  09/22/2016   PLT 198 09/22/2016    Recent Labs  09/21/16 2332  INR 1.17    Recent Labs Lab 09/22/16 0417  NA 138  K 4.4  CL 103  CO2 26  BUN 20  CREATININE 0.89  CALCIUM 9.0  GLUCOSE 142*   No results found for: MG  Recent Labs  09/21/16 2332  TROPONINI <0.03   No results for input(s): TROPIPOC in the last 72 hours. No results found for: PROBNP No results found for: CHOL, HDL, LDLCALC, TRIG   Echo: 05/22/2016 LV EF: 45% Study Conclusions - Left ventricle: The cavity size was normal. Wall thickness was   normal. Indeterminant diastolic function (atrial fibrillation).   The estimated ejection fraction was 45%. Diffuse hypokinesis. - Aortic valve: There was no stenosis. There was trivial   regurgitation. - Mitral valve: Mildly calcified annulus. Mildly calcified leaflets   . There was mild to moderate regurgitation. - Left atrium: The atrium was severely dilated. - Right ventricle: The cavity size was normal. Systolic function   was mildly reduced. - Right atrium: The atrium was moderately dilated. - Tricuspid valve: Peak RV-RA gradient (S): 25 mm Hg. - Systemic veins: The IVC was not visualized. Impressions: - The patient was in atrial fibrillation. Normal LV size with EF   45%, diffuse hypokinesis. Normal RV size with mildly decreased   systolic function. Mild to moderate mitral regurgitation.   Biatrial enlargement.   Nuclear stress test 05/2015 Study Highlights    The left ventricular ejection fraction is normal (55-65%).  Nuclear stress EF: 56%.  There was no ST segment deviation noted during stress.  No T wave inversion was noted during stress.  The study is normal.  This is a low risk study.       ECG: atrial flutter with 4:1 block HR 55  Radiology:  Ct Head Wo Contrast  Result Date: 09/22/2016 CLINICAL DATA:  Syncopal episode in bathroom, struck LEFT head, large hematoma. Patient does not recall event. Neck pain. History of  hypertension, hyperlipidemia and diabetes. EXAM: CT  HEAD WITHOUT CONTRAST CT CERVICAL SPINE WITHOUT CONTRAST TECHNIQUE: Multidetector CT imaging of the head and cervical spine was performed following the standard protocol without intravenous contrast. Multiplanar CT image reconstructions of the cervical spine were also generated. COMPARISON:  CT HEAD and cervical spine July 11, 2016 FINDINGS: CT HEAD FINDINGS BRAIN: The ventricles and sulci are normal for age. No intraparenchymal hemorrhage, mass effect nor midline shift. Patchy supratentorial white matter hypodensities less than expected for patient's age, though non-specific are most compatible with chronic small vessel ischemic disease. No acute large vascular territory infarcts. No abnormal extra-axial fluid collections. Basal cisterns are patent. VASCULAR: Moderate to severe calcific atherosclerosis of the carotid siphons. SKULL: No skull fracture. Large LEFT frontal scalp hematoma without subcutaneous gas or radiopaque foreign bodies. SINUSES/ORBITS: The mastoid air-cells and included paranasal sinuses are well-aerated. Status post bilateral ocular lens implants. The included ocular globes and orbital contents are non-suspicious. OTHER: None. CT CERVICAL SPINE FINDINGS ALIGNMENT: Vertebral bodies in alignment.  Straightened lordosis. SKULL BASE AND VERTEBRAE: No acute fracture. Old C2 fracture with intact screw, no periprosthetic lucency. Status post C5 through C7 ACDF, incorporated interbody strut. Facet screws C5 through C7, pedicle screws to the level of T2. Posterior hardware is intact, no periprosthetic lucency. Partially incorporated posterior bone graft material. Multilevel mild degenerative discs. C1-2 articulation maintained with mild arthropathy. Osteopenia without destructive bony lesions. SOFT TISSUES AND SPINAL CANAL: Nonacute. Moderate calcific atherosclerosis of LEFT carotid bulb. DISC LEVELS: Mild canal stenosis C3-4. C3-4 severe neural  foraminal narrowing, moderate to severe at C5-6, severe at C6-7. UPPER CHEST: Lung apices are clear. OTHER: None. IMPRESSION: CT HEAD: Large LEFT frontal scalp hematoma.  No skull fracture. No acute intracranial process; negative CT HEAD for age. CT CERVICAL SPINE: No acute fracture or malalignment. Extensive cervical thoracic spinal hardware without hardware failure. Electronically Signed   By: Elon Alas M.D.   On: 09/22/2016 01:02   Ct Cervical Spine Wo Contrast  Result Date: 09/22/2016 CLINICAL DATA:  Syncopal episode in bathroom, struck LEFT head, large hematoma. Patient does not recall event. Neck pain. History of hypertension, hyperlipidemia and diabetes. EXAM: CT HEAD WITHOUT CONTRAST CT CERVICAL SPINE WITHOUT CONTRAST TECHNIQUE: Multidetector CT imaging of the head and cervical spine was performed following the standard protocol without intravenous contrast. Multiplanar CT image reconstructions of the cervical spine were also generated. COMPARISON:  CT HEAD and cervical spine July 11, 2016 FINDINGS: CT HEAD FINDINGS BRAIN: The ventricles and sulci are normal for age. No intraparenchymal hemorrhage, mass effect nor midline shift. Patchy supratentorial white matter hypodensities less than expected for patient's age, though non-specific are most compatible with chronic small vessel ischemic disease. No acute large vascular territory infarcts. No abnormal extra-axial fluid collections. Basal cisterns are patent. VASCULAR: Moderate to severe calcific atherosclerosis of the carotid siphons. SKULL: No skull fracture. Large LEFT frontal scalp hematoma without subcutaneous gas or radiopaque foreign bodies. SINUSES/ORBITS: The mastoid air-cells and included paranasal sinuses are well-aerated. Status post bilateral ocular lens implants. The included ocular globes and orbital contents are non-suspicious. OTHER: None. CT CERVICAL SPINE FINDINGS ALIGNMENT: Vertebral bodies in alignment.  Straightened  lordosis. SKULL BASE AND VERTEBRAE: No acute fracture. Old C2 fracture with intact screw, no periprosthetic lucency. Status post C5 through C7 ACDF, incorporated interbody strut. Facet screws C5 through C7, pedicle screws to the level of T2. Posterior hardware is intact, no periprosthetic lucency. Partially incorporated posterior bone graft material. Multilevel mild degenerative discs. C1-2 articulation maintained with mild arthropathy. Osteopenia without  destructive bony lesions. SOFT TISSUES AND SPINAL CANAL: Nonacute. Moderate calcific atherosclerosis of LEFT carotid bulb. DISC LEVELS: Mild canal stenosis C3-4. C3-4 severe neural foraminal narrowing, moderate to severe at C5-6, severe at C6-7. UPPER CHEST: Lung apices are clear. OTHER: None. IMPRESSION: CT HEAD: Large LEFT frontal scalp hematoma.  No skull fracture. No acute intracranial process; negative CT HEAD for age. CT CERVICAL SPINE: No acute fracture or malalignment. Extensive cervical thoracic spinal hardware without hardware failure. Electronically Signed   By: Elon Alas M.D.   On: 09/22/2016 01:02   Dg Shoulder Left  Result Date: 09/22/2016 CLINICAL DATA:  [place] on mass.  Left shoulder hurts. EXAM: LEFT SHOULDER - 2+ VIEW COMPARISON:  None. FINDINGS: Examination is limited by patient positioning. Partially visualized sternal wires. Atherosclerotic vascular disease of the aorta. There is stent material at the aortic arch. There is no acute fracture or dislocation. Marked degenerative changes of the left shoulder with effacement of the sub acromial space. Probable old fracture of the left sixth rib. There is partially visualized hardware within the cervical spine. IMPRESSION: 1. No acute osseous abnormality allowing for suboptimal positioning. 2. Probable old fracture of the left sixth rib. Electronically Signed   By: Donavan Foil M.D.   On: 09/22/2016 00:54   Dg Hip Unilat W Or Wo Pelvis 2-3 Views Right  Result Date:  09/22/2016 CLINICAL DATA:  Fall, head injury, hip pain EXAM: DG HIP (WITH OR WITHOUT PELVIS) 2-3V RIGHT COMPARISON:  CT 07/11/2016 FINDINGS: There are old fracture deformities of the right inferior and superior pubic rami. No definite acute displaced fracture or dislocation is visualized. There is SI joint arthritic change. Prominent bone spur off of the right iliac bone. Vascular calcifications. Degenerative changes of the lumbar spine. IMPRESSION: 1. No definite acute osseous abnormality. CT follow-up may be performed continued suspicion for hip fracture. 2. Old right superior and inferior pubic rami fractures. Electronically Signed   By: Donavan Foil M.D.   On: 09/22/2016 00:58    ASSESSMENT AND PLAN:    Principal Problem:   Syncope Active Problems:   CAD (coronary artery disease)   Fall   Chronic systolic congestive heart failure (HCC)   Hyperlipidemia   Hypertension   Hyperkalemia   Laceration of scalp without foreign body   Atrial fibrillation (HCC)   Diabetes mellitus without complication ([place])  Syncope and fall: not clear that this was a syncopal episode or mechanical fall. Patient doesn't remember anything from the time of the event. She does admit to frequent falls. I don't seen any significant bradycardia on tele or ECGs. Lowest HR documented is 55 by ECG yesterday. Bradycardia in the ER could have been related to hyperkalemia which is now resolved. Would plan for outpatient monitor and follow up. Not sure we need to hold Coreg given her HRs are in the 80s now.  Atrial Flutter: heart currently well rate controlled. Coreg held given initial bradycardia.  CHA2DS2-VASc Score is 7 and patient has been treated with Eliquis at home. Agree that this should be discontinued given frequent fall history.   Hyperkalemia: K=5.4. Given Kayexalate 30 g  and K now WNL  LV dysfunction: 2-D echo on 05/22/16 showed EF 45%. She has never had clinic CHF that we know of. Continue ACE. BB held 2/2  bradycardia on admission. Euvolemic without diuretic therapy.   CAD: no chest pain. Troponin negative. Recent low risk nuc in 05/2015  Carotid artery disease s/p previous CEA: dopplers in 05/2015 show mild plaque.  Follow up dopplers overdue.   HTN: BP well controlled currently. Coreg is being held 2/2 bradycardia   HLD: continue statin.    Signed: Angelena Form, PA-C 09/22/2016 11:10 AM  Pager [phone]  Co-Sign MD  The patient was seen, examined and discussed with Nell Range, PA-C and I agree with the above.   A pleasant 82 year old patient who recently moved here from [facility], followed by Dr Percival Spanish, with h/o CAD s/p previous PCIs and CABG (2011 in [place]), HTN, HLD, PVD s/p PCI subclavian artery (2013), carotid artery disease s/p remote CEA, DMT2 w/ peripheral neuropathy with frequent falls, LV dysfunction with no clinical CHF and persistent atrial fib/flutter on Eliquis who presented to [facility] on 09/21/16 with syncope.  It is described in the note that her HR down to 37, on telemetry on the floor 50-118 BPM on a low dose carvedilol 3.125 mg po BID.  The patient states that in the past she attributed her falls to neuropathy and she always knows taht she is going to fall, however this time she had no prodromes and she blacked out, hitting her head.  She appears euvolemic and in no distress. We will consult EP for consideration of a a-flutter ablation/PM placement in the settings of a tachy-brady syndrome.   Ena Dawley 09/22/2016

## 2016-09-23 DIAGNOSIS — I4892 Unspecified atrial flutter: Secondary | ICD-10-CM | POA: Diagnosis not present

## 2016-09-23 DIAGNOSIS — I10 Essential (primary) hypertension: Secondary | ICD-10-CM

## 2016-09-23 DIAGNOSIS — I2510 Atherosclerotic heart disease of native coronary artery without angina pectoris: Secondary | ICD-10-CM | POA: Diagnosis not present

## 2016-09-23 DIAGNOSIS — I4891 Unspecified atrial fibrillation: Secondary | ICD-10-CM

## 2016-09-23 DIAGNOSIS — W19XXXD Unspecified fall, subsequent encounter: Secondary | ICD-10-CM | POA: Diagnosis not present

## 2016-09-23 DIAGNOSIS — R55 Syncope and collapse: Secondary | ICD-10-CM | POA: Diagnosis not present

## 2016-09-23 DIAGNOSIS — E875 Hyperkalemia: Secondary | ICD-10-CM

## 2016-09-23 DIAGNOSIS — E785 Hyperlipidemia, unspecified: Secondary | ICD-10-CM

## 2016-09-23 LAB — GLUCOSE, CAPILLARY: GLUCOSE-CAPILLARY: 137 mg/dL — AB (ref 65–99)

## 2016-09-23 MED ORDER — ZOLPIDEM TARTRATE 5 MG PO TABS: 5.0000 mg | ORAL_TABLET | Freq: Every evening | ORAL | 0 refills | Status: AC | PRN

## 2016-09-23 MED ORDER — LISINOPRIL 5 MG PO TABS: 5.0000 mg | ORAL_TABLET | Freq: Every day | ORAL | 0 refills | Status: AC

## 2016-09-23 NOTE — Discharge Summary (Addendum)
Physician Discharge Summary  Jessica King IHK:[id number] DOB: 1931/12/16 DOA: 09/21/2016  PCP: Mathews Argyle, MD  Admit date: 09/21/2016 Discharge date: 09/23/2016  Time spent: 45 minutes  Recommendations for Outpatient Follow-up:  Patient will be discharged to assisted living with home health PT and OT.  Patient will need to follow up with primary care provider within one week of discharge.  Patient will need cardiac event monitor.  Patient should continue medications as prescribed.  Patient should follow a heart healthy/carb modified diet.   Discharge Diagnoses:  Syncope and fall Atrial fibrillation Coronary artery disease Hyperkalemia Hyperlipidemia Chronic systolic heart failure Essential hypertension Diabetes mellitus, type II Laceration of scalp  Discharge Condition: Stable  Diet recommendation: heart healthy/carb modified  Filed Weights   09/23/16 0500  Weight: 68 kg (149 lb 14.4 oz)    History of present illness:  on 09/22/2016 by Dr. Maree Erie Rowlandis a 82 y.o.femalewith medical history significant of hypertension, hyperlipidemia, diabetes mellitus, PVD, iron deficiency anemia, CAD,s/p of CABG, s/p of stent placement, atrial fibrillation on Eliquis, who presents with fall and syncope.  Per patient's daughter, patient had multiple falls recently, 3 times in this week. She had fall again today. Upon triage, pt states that she "does not remember this fall". Thinks she may have "blacked out". Pt sustained head injury with skin tear in left frontal area. She also has left shoulder and right hip pain. Patient denies prodromal symptoms, no unilateral weakness, numbness or tingling seen in extremities, no dizziness, chest pain, palpitation, lightheadedness. No vision change. Patient states that recently she seems to have mild hearing loss. Patient states that sometimes she has a diarrhea recently, but no diarrhea today. Denies nausea, vomiting, abdominal  pain, symptoms of UTI. She states that she has bilateral peripheral leg neuropathy, making her feeling weak and difficulty walking.  Hospital Course:  Syncope and fall -Etiology unclear however may be combination of bradycardia noted in the ER upon admission, versus neuropathy -Cardiology consulted and appreciated -PT and OT consulted recommended HH -CT head: Large left frontal scalp hematoma, no skull fracture, no acute intracranial process. -CT cervical spine: No acute fracture or malalignment -Spoke with patient's daughter, no need for MRI at this time as patient does not have any neurological deficits. -Coreg held -EP consulted, recommended pacemaker, however patient reluctant.  Recommended event monitor. Cardiology will arrange.   Atrial fibrillation -CHADSVASC 7 -Coreg and Eliquis currently held -Patient has been falling more frequently. Spoke with patient's daughter regarding holding Eliquis indefinitely given that she is a fall risk. Patient's daughter understood the risks versus benefits of holding or discontinuing Eliquis.  Coronary artery disease -Status post CABG -Currently no chest pain -Continue statin -May consider aspirin  Hyperkalemia -Resolved, continue to monitor BMP  Hyperlipidemia -Continue statin  Chronic systolic heart failure -Patient appears to be currently euvolemic and compensated -Was not on diuretics at home -Echocardiogram 05/22/2016 showed an EF of 45%  Essential hypertension -Continue lisinopril-increased dose to 5mg  daily, Coreg held due to bradycardia  Diabetes mellitus, type II -Metformin currently held- can restart upon discharge -Continue insulin complaints count CBG monitoring  Laceration of scalp -Secondary to fall -Wound care consulted -Currently sutured  Consultants Cardiology  Procedures  None   Discharge Exam: Vitals:   09/22/16 2006 09/23/16 0500  BP: 140/63 (!) 163/78  Pulse: 82 66  Resp: 19 20  Temp:  98.4 F (36.9 C) 98.1 F (36.7 C)   No complaints today. Patient currently denies any chest pain, shortness of  breath, abdominal pain, nausea or vomiting, dizziness, or headache.  Exam  General: Well developed, well nourished, NAD, appears stated age  82: Bath, Left frontal hematoma, sutures in place, PERRLA, EOMI, Anicteic Sclera, mucous membranes moist.   Neck: Supple, no JVD, no masses  Cardiovascular: S1 S2 auscultated, irregular, no murmur  Respiratory: Clear to auscultation bilaterally with equal chest rise  Abdomen: Soft, nontender, nondistended, + bowel sounds  Extremities: warm dry without cyanosis clubbing or edema  Neuro: AAOx3, nonfocal  Psych: Normal affect and demeanor with intact judgement and insight  Discharge Instructions Discharge Instructions    Discharge instructions    Complete by:  As directed    Patient will be discharged to assisted living with home health PT and OT.  Patient will need to follow up with primary care provider within one week of discharge.  Patient will need cardiac event monitor.  Patient should continue medications as prescribed.  Patient should follow a heart healthy/carb modified diet.     Current Discharge Medication List    CONTINUE these medications which have CHANGED   Details  zolpidem (AMBIEN) 5 MG tablet Take 1 tablet (5 mg total) by mouth at bedtime as needed for sleep. Qty: 30 tablet, Refills: 0      CONTINUE these medications which have NOT CHANGED   Details  Ca Carbonate-Mag Hydroxide (ROLAIDS) 550-110 MG CHEW Chew 1 tablet by mouth as needed (for heart burn).    clonazePAM (KLONOPIN) 0.5 MG tablet Take 0.5 mg by mouth 2 (two) times daily. Takes 1 tab in daytime as needed and 1 tab every bedtime    CRANBERRY PO Take 4,200 mg by mouth daily.    gabapentin (NEURONTIN) 300 MG capsule Take 300-600 mg by mouth See admin instructions. Take 1 capsule at 8 am and 12 pm and then take 2 capsules at bedtime      HYDROcodone-acetaminophen (NORCO) 7.5-325 MG per tablet Take 1 tablet by mouth every 6 (six) hours as needed for moderate pain.     lisinopril (PRINIVIL,ZESTRIL) 2.5 MG tablet Take 2.5 mg by mouth daily.    loratadine (CLARITIN) 10 MG tablet Take 10 mg by mouth daily as needed for allergies.     metFORMIN (GLUCOPHAGE) 1000 MG tablet Take 1,000 mg by mouth 2 (two) times daily with a meal.    methocarbamol (ROBAXIN) 750 MG tablet Take 750 mg by mouth every 6 (six) hours as needed for muscle spasms.     Multiple Vitamin (MULTIVITAMIN WITH MINERALS) TABS tablet Take 1 tablet by mouth daily.    PARoxetine (PAXIL) 20 MG tablet Take 20 mg by mouth daily.    simvastatin (ZOCOR) 40 MG tablet Take 40 mg by mouth daily.      STOP taking these medications     apixaban (ELIQUIS) 2.5 MG TABS tablet      carvedilol (COREG) 3.125 MG tablet        Allergies  Allergen Reactions  . Sulfa Antibiotics Swelling and Other (See Comments)    Maybe other reactions, unknown specifically   Follow-up Information    Mathews Argyle, MD. Schedule an appointment as soon as possible for a visit in 2 day(s).   Specialty:  Internal Medicine Contact information: [address] [place] [address] [phone]            The results of significant diagnostics from this hospitalization (including imaging, microbiology, ancillary and laboratory) are listed below for reference.    Significant Diagnostic Studies: Ct Head  Wo Contrast  Result Date: 09/22/2016 CLINICAL DATA:  Syncopal episode in bathroom, struck LEFT head, large hematoma. Patient does not recall event. Neck pain. History of hypertension, hyperlipidemia and diabetes. EXAM: CT HEAD WITHOUT CONTRAST CT CERVICAL SPINE WITHOUT CONTRAST TECHNIQUE: Multidetector CT imaging of the head and cervical spine was performed following the standard protocol without intravenous contrast. Multiplanar CT image reconstructions of the  cervical spine were also generated. COMPARISON:  CT HEAD and cervical spine July 11, 2016 FINDINGS: CT HEAD FINDINGS BRAIN: The ventricles and sulci are normal for age. No intraparenchymal hemorrhage, mass effect nor midline shift. Patchy supratentorial white matter hypodensities less than expected for patient's age, though non-specific are most compatible with chronic small vessel ischemic disease. No acute large vascular territory infarcts. No abnormal extra-axial fluid collections. Basal cisterns are patent. VASCULAR: Moderate to severe calcific atherosclerosis of the carotid siphons. SKULL: No skull fracture. Large LEFT frontal scalp hematoma without subcutaneous gas or radiopaque foreign bodies. SINUSES/ORBITS: The mastoid air-cells and included paranasal sinuses are well-aerated. Status post bilateral ocular lens implants. The included ocular globes and orbital contents are non-suspicious. OTHER: None. CT CERVICAL SPINE FINDINGS ALIGNMENT: Vertebral bodies in alignment.  Straightened lordosis. SKULL BASE AND VERTEBRAE: No acute fracture. Old C2 fracture with intact screw, no periprosthetic lucency. Status post C5 through C7 ACDF, incorporated interbody strut. Facet screws C5 through C7, pedicle screws to the level of T2. Posterior hardware is intact, no periprosthetic lucency. Partially incorporated posterior bone graft material. Multilevel mild degenerative discs. C1-2 articulation maintained with mild arthropathy. Osteopenia without destructive bony lesions. SOFT TISSUES AND SPINAL CANAL: Nonacute. Moderate calcific atherosclerosis of LEFT carotid bulb. DISC LEVELS: Mild canal stenosis C3-4. C3-4 severe neural foraminal narrowing, moderate to severe at C5-6, severe at C6-7. UPPER CHEST: Lung apices are clear. OTHER: None. IMPRESSION: CT HEAD: Large LEFT frontal scalp hematoma.  No skull fracture. No acute intracranial process; negative CT HEAD for age. CT CERVICAL SPINE: No acute fracture or malalignment.  Extensive cervical thoracic spinal hardware without hardware failure. Electronically Signed   By: Elon Alas M.D.   On: 09/22/2016 01:02   Ct Cervical Spine Wo Contrast  Result Date: 09/22/2016 CLINICAL DATA:  Syncopal episode in bathroom, struck LEFT head, large hematoma. Patient does not recall event. Neck pain. History of hypertension, hyperlipidemia and diabetes. EXAM: CT HEAD WITHOUT CONTRAST CT CERVICAL SPINE WITHOUT CONTRAST TECHNIQUE: Multidetector CT imaging of the head and cervical spine was performed following the standard protocol without intravenous contrast. Multiplanar CT image reconstructions of the cervical spine were also generated. COMPARISON:  CT HEAD and cervical spine July 11, 2016 FINDINGS: CT HEAD FINDINGS BRAIN: The ventricles and sulci are normal for age. No intraparenchymal hemorrhage, mass effect nor midline shift. Patchy supratentorial white matter hypodensities less than expected for patient's age, though non-specific are most compatible with chronic small vessel ischemic disease. No acute large vascular territory infarcts. No abnormal extra-axial fluid collections. Basal cisterns are patent. VASCULAR: Moderate to severe calcific atherosclerosis of the carotid siphons. SKULL: No skull fracture. Large LEFT frontal scalp hematoma without subcutaneous gas or radiopaque foreign bodies. SINUSES/ORBITS: The mastoid air-cells and included paranasal sinuses are well-aerated. Status post bilateral ocular lens implants. The included ocular globes and orbital contents are non-suspicious. OTHER: None. CT CERVICAL SPINE FINDINGS ALIGNMENT: Vertebral bodies in alignment.  Straightened lordosis. SKULL BASE AND VERTEBRAE: No acute fracture. Old C2 fracture with intact screw, no periprosthetic lucency. Status post C5 through C7 ACDF, incorporated interbody strut. Facet screws C5 through  C7, pedicle screws to the level of T2. Posterior hardware is intact, no periprosthetic lucency.  Partially incorporated posterior bone graft material. Multilevel mild degenerative discs. C1-2 articulation maintained with mild arthropathy. Osteopenia without destructive bony lesions. SOFT TISSUES AND SPINAL CANAL: Nonacute. Moderate calcific atherosclerosis of LEFT carotid bulb. DISC LEVELS: Mild canal stenosis C3-4. C3-4 severe neural foraminal narrowing, moderate to severe at C5-6, severe at C6-7. UPPER CHEST: Lung apices are clear. OTHER: None. IMPRESSION: CT HEAD: Large LEFT frontal scalp hematoma.  No skull fracture. No acute intracranial process; negative CT HEAD for age. CT CERVICAL SPINE: No acute fracture or malalignment. Extensive cervical thoracic spinal hardware without hardware failure. Electronically Signed   By: Elon Alas M.D.   On: 09/22/2016 01:02   Dg Shoulder Left  Result Date: 09/22/2016 CLINICAL DATA:  [place] on mass.  Left shoulder hurts. EXAM: LEFT SHOULDER - 2+ VIEW COMPARISON:  None. FINDINGS: Examination is limited by patient positioning. Partially visualized sternal wires. Atherosclerotic vascular disease of the aorta. There is stent material at the aortic arch. There is no acute fracture or dislocation. Marked degenerative changes of the left shoulder with effacement of the sub acromial space. Probable old fracture of the left sixth rib. There is partially visualized hardware within the cervical spine. IMPRESSION: 1. No acute osseous abnormality allowing for suboptimal positioning. 2. Probable old fracture of the left sixth rib. Electronically Signed   By: Donavan Foil M.D.   On: 09/22/2016 00:54   Dg Hip Unilat W Or Wo Pelvis 2-3 Views Right  Result Date: 09/22/2016 CLINICAL DATA:  Fall, head injury, hip pain EXAM: DG HIP (WITH OR WITHOUT PELVIS) 2-3V RIGHT COMPARISON:  CT 07/11/2016 FINDINGS: There are old fracture deformities of the right inferior and superior pubic rami. No definite acute displaced fracture or dislocation is visualized. There is SI joint arthritic  change. Prominent bone spur off of the right iliac bone. Vascular calcifications. Degenerative changes of the lumbar spine. IMPRESSION: 1. No definite acute osseous abnormality. CT follow-up may be performed continued suspicion for hip fracture. 2. Old right superior and inferior pubic rami fractures. Electronically Signed   By: Donavan Foil M.D.   On: 09/22/2016 00:58    Microbiology: No results found for this or any previous visit (from the past 240 hour(s)).   Labs: Basic Metabolic Panel:  Recent Labs Lab 09/21/16 2332 09/22/16 0417  NA 138 138  K 5.4* 4.4  CL 102 103  CO2 28 26  GLUCOSE 144* 142*  BUN 22* 20  CREATININE 0.99 0.89  CALCIUM 9.5 9.0   Liver Function Tests: No results for input(s): AST, ALT, ALKPHOS, BILITOT, PROT, ALBUMIN in the last 168 hours. No results for input(s): LIPASE, AMYLASE in the last 168 hours. No results for input(s): AMMONIA in the last 168 hours. CBC:  Recent Labs Lab 09/21/16 2332 09/22/16 0417  WBC 9.7 8.7  NEUTROABS 5.8  --   HGB 10.6* 10.0*  HCT 32.5* 31.3*  MCV 97.6 98.1  PLT 205 198   Cardiac Enzymes:  Recent Labs Lab 09/21/16 2332  TROPONINI <0.03   BNP: BNP (last 3 results)  Recent Labs  09/22/16 0417  BNP 201.8*    ProBNP (last 3 results) No results for input(s): PROBNP in the last 8760 hours.  CBG:  Recent Labs Lab 09/22/16 0554 09/22/16 1135 09/22/16 1652 09/22/16 2302 09/23/16 0657  GLUCAP 124* 204* 138* 128* 137*       Signed:  Cristal Ford  [facility] 09/23/2016, 10:06  AM

## 2016-09-23 NOTE — Discharge Instructions (Signed)
Take Tylenol and use ice for pain. Keep wound clean and watch for signs of infection such as fever, spreading redness, pus draining. Have sutures removed in approximately 6-7 days. Please check on patient once in the middle of the night and then as needed throughout the day.    If you were given medicines take as directed.  If you are on coumadin or contraceptives realize their levels and effectiveness is altered by many different medicines.  If you have any reaction (rash, tongues swelling, other) to the medicines stop taking and see a physician.    If your blood pressure was elevated in the ER make sure you follow up for management with a primary doctor or return for chest pain, shortness of breath or stroke symptoms.  Please follow up as directed and return to the ER or see a physician for new or worsening symptoms.  Thank you.    Syncope Syncope is a medical term for fainting or passing out. This means you lose consciousness and drop to the ground. People are generally unconscious for less than 5 minutes. You may have some muscle twitches for up to 15 seconds before waking up and returning to normal. Syncope occurs more often in older adults, but it can happen to anyone. While most causes of syncope are not dangerous, syncope can be a sign of a serious medical problem. It is important to seek medical care.  CAUSES  Syncope is caused by a sudden drop in blood flow to the brain. The specific cause is often not determined. Factors that can bring on syncope include:  Taking medicines that lower blood pressure.  Sudden changes in posture, such as standing up quickly.  Taking more medicine than prescribed.  Standing in one place for too long.  Seizure disorders.  Dehydration and excessive exposure to heat.  Low blood sugar (hypoglycemia).  Straining to have a bowel movement.  Heart disease, irregular heartbeat, or other circulatory problems.  Fear, emotional distress, seeing blood,  or severe pain. SYMPTOMS  Right before fainting, you may:  Feel dizzy or light-headed.  Feel nauseous.  See all white or all black in your field of vision.  Have cold, clammy skin. DIAGNOSIS  Your health care provider will ask about your symptoms, perform a physical exam, and perform an electrocardiogram (ECG) to record the electrical activity of your heart. Your health care provider may also perform other heart or blood tests to determine the cause of your syncope which may include:  Transthoracic echocardiogram (TTE). During echocardiography, sound waves are used to evaluate how blood flows through your heart.  Transesophageal echocardiogram (TEE).  Cardiac monitoring. This allows your health care provider to monitor your heart rate and rhythm in real time.  Holter monitor. This is a portable device that records your heartbeat and can help diagnose heart arrhythmias. It allows your health care provider to track your heart activity for several days, if needed.  Stress tests by exercise or by giving medicine that makes the heart beat faster. TREATMENT  In most cases, no treatment is needed. Depending on the cause of your syncope, your health care provider may recommend changing or stopping some of your medicines. HOME CARE INSTRUCTIONS  Have someone stay with you until you feel stable.  Do not drive, use machinery, or play sports until your health care provider says it is okay.  Keep all follow-up appointments as directed by your health care provider.  Lie down right away if you start feeling like you  might faint. Breathe deeply and steadily. Wait until all the symptoms have passed.  Drink enough fluids to keep your urine clear or pale yellow.  If you are taking blood pressure or heart medicine, get up slowly and take several minutes to sit and then stand. This can reduce dizziness. SEEK IMMEDIATE MEDICAL CARE IF:   You have a severe headache.  You have unusual pain in the  chest, abdomen, or back.  You are bleeding from your mouth or rectum, or you have black or tarry stool.  You have an irregular or very fast heartbeat.  You have pain with breathing.  You have repeated fainting or seizure-like jerking during an episode.  You faint when sitting or lying down.  You have confusion.  You have trouble walking.  You have severe weakness.  You have vision problems. If you fainted, call your local emergency services (911 in [place] Do not drive yourself to the hospital.    This information is not intended to replace advice given to you by your health care provider. Make sure you discuss any questions you have with your health care provider.   Document Released: 11/17/2005 Document Revised: 04/03/2015 Document Reviewed: 01/16/2012 Elsevier Interactive Patient Education [employer].

## 2016-09-23 NOTE — Plan of Care (Signed)
Problem: Pain Managment: Goal: General experience of comfort will improve Outcome: Progressing Patient has chronic pain in neck, back and shoulders.  Receiving Norco for this as prior to admit

## 2016-09-23 NOTE — Progress Notes (Addendum)
Patient Name: Jessica King Date of Encounter: 09/23/2016  Principal Problem:   Syncope Active Problems:   CAD (coronary artery disease)   Fall   Chronic systolic congestive heart failure (HCC)   Hyperlipidemia   Hypertension   Hyperkalemia   Laceration of scalp without foreign body   Atrial fibrillation ([place])   Diabetes mellitus without complication ([place])   Length of Stay: 0  SUBJECTIVE  The patient feels well today, no falls or dizziness since yesterday.  CURRENT MEDS . clonazePAM  0.5 mg Oral BID  . gabapentin  300 mg Oral 2 times per day  . gabapentin  600 mg Oral QHS  . insulin aspart  0-5 Units Subcutaneous QHS  . insulin aspart  0-9 Units Subcutaneous TID WC  . lisinopril  2.5 mg Oral Daily  . multivitamin with minerals  1 tablet Oral Daily  . PARoxetine  20 mg Oral Daily  . simvastatin  40 mg Oral q1800  . sodium chloride flush  3 mL Intravenous Q12H    OBJECTIVE  Vitals:   09/22/16 0520 09/22/16 0801 09/22/16 2006 09/23/16 0500  BP:  136/77 140/63 (!) 163/78  Pulse:  79 82 66  Resp:  20 19 20   Temp: 97.8 F (36.6 C) 97.9 F (36.6 C) 98.4 F (36.9 C) 98.1 F (36.7 C)  TempSrc: Oral Oral Oral Oral  SpO2: 99% 98% 98% 96%  Weight:    149 lb 14.4 oz (68 kg)    Intake/Output Summary (Last 24 hours) at 09/23/16 1126 Last data filed at 09/23/16 0853  Gross per 24 hour  Intake             2760 ml  Output             1700 ml  Net             1060 ml   Filed Weights   09/23/16 0500  Weight: 149 lb 14.4 oz (68 kg)    PHYSICAL EXAM  General: Pleasant, NAD. Neuro: Alert and oriented X 3. Moves all extremities spontaneously. Psych: Normal affect. HEENT:  Normal  Neck: Supple without bruits or JVD. Lungs:  Resp regular and unlabored, CTA. Heart: RRR no s3, s4, or murmurs. Abdomen: Soft, non-tender, non-distended, BS + x 4.  Extremities: No clubbing, cyanosis or edema. DP/PT/Radials 2+ and equal bilaterally.  Accessory Clinical  Findings  CBC  Recent Labs  09/21/16 2332 09/22/16 0417  WBC 9.7 8.7  NEUTROABS 5.8  --   HGB 10.6* 10.0*  HCT 32.5* 31.3*  MCV 97.6 98.1  PLT 205 762   Basic Metabolic Panel  Recent Labs  09/21/16 2332 09/22/16 0417  NA 138 138  K 5.4* 4.4  CL 102 103  CO2 28 26  GLUCOSE 144* 142*  BUN 22* 20  CREATININE 0.99 0.89  CALCIUM 9.5 9.0    Recent Labs  09/21/16 2332  TROPONINI <0.03   Radiology/Studies  Ct Head Wo Contrast  Result Date: 09/22/2016 CLINICAL DATA:  Syncopal episode in bathroom, struck LEFT head, large hematoma. Patient does not recall event. Neck pain. History of hypertension, hyperlipidemia and diabetes. EXAM: CT HEAD WITHOUT CONTRAST CT CERVICAL SPINE WITHOUT CONTRAST TECHNIQUE: Multidetector CT imaging of the head and cervical spine was performed following the standard protocol without intravenous contrast. Multiplanar CT image reconstructions of the cervical spine were also generated. COMPARISON:  CT HEAD and cervical spine July 11, 2016 FINDINGS: CT HEAD FINDINGS BRAIN: The ventricles and sulci are normal for  age. No intraparenchymal hemorrhage, mass effect nor midline shift. Patchy supratentorial white matter hypodensities less than expected for patient's age, though non-specific are most compatible with chronic small vessel ischemic disease. No acute large vascular territory infarcts. No abnormal extra-axial fluid collections. Basal cisterns are patent. VASCULAR: Moderate to severe calcific atherosclerosis of the carotid siphons. SKULL: No skull fracture. Large LEFT frontal scalp hematoma without subcutaneous gas or radiopaque foreign bodies. SINUSES/ORBITS: The mastoid air-cells and included paranasal sinuses are well-aerated. Status post bilateral ocular lens implants. The included ocular globes and orbital contents are non-suspicious. OTHER: None. CT CERVICAL SPINE FINDINGS ALIGNMENT: Vertebral bodies in alignment.  Straightened lordosis. SKULL BASE AND  VERTEBRAE: No acute fracture. Old C2 fracture with intact screw, no periprosthetic lucency. Status post C5 through C7 ACDF, incorporated interbody strut. Facet screws C5 through C7, pedicle screws to the level of T2. Posterior hardware is intact, no periprosthetic lucency. Partially incorporated posterior bone graft material. Multilevel mild degenerative discs. C1-2 articulation maintained with mild arthropathy. Osteopenia without destructive bony lesions. SOFT TISSUES AND SPINAL CANAL: Nonacute. Moderate calcific atherosclerosis of LEFT carotid bulb. DISC LEVELS: Mild canal stenosis C3-4. C3-4 severe neural foraminal narrowing, moderate to severe at C5-6, severe at C6-7. UPPER CHEST: Lung apices are clear. OTHER: None. IMPRESSION: CT HEAD: Large LEFT frontal scalp hematoma.  No skull fracture. No acute intracranial process; negative CT HEAD for age. CT CERVICAL SPINE: No acute fracture or malalignment. Extensive cervical thoracic spinal hardware without hardware failure. Electronically Signed   By: Elon Alas M.D.   On: 09/22/2016 01:02   Ct Cervical Spine Wo Contrast  Result Date: 09/22/2016 CLINICAL DATA:  Syncopal episode in bathroom, struck LEFT head, large hematoma. Patient does not recall event. Neck pain. History of hypertension, hyperlipidemia and diabetes. EXAM: CT HEAD WITHOUT CONTRAST CT CERVICAL SPINE WITHOUT CONTRAST TECHNIQUE: Multidetector CT imaging of the head and cervical spine was performed following the standard protocol without intravenous contrast. Multiplanar CT image reconstructions of the cervical spine were also generated. COMPARISON:  CT HEAD and cervical spine July 11, 2016 FINDINGS: CT HEAD FINDINGS BRAIN: The ventricles and sulci are normal for age. No intraparenchymal hemorrhage, mass effect nor midline shift. Patchy supratentorial white matter hypodensities less than expected for patient's age, though non-specific are most compatible with chronic small vessel ischemic  disease. No acute large vascular territory infarcts. No abnormal extra-axial fluid collections. Basal cisterns are patent. VASCULAR: Moderate to severe calcific atherosclerosis of the carotid siphons. SKULL: No skull fracture. Large LEFT frontal scalp hematoma without subcutaneous gas or radiopaque foreign bodies. SINUSES/ORBITS: The mastoid air-cells and included paranasal sinuses are well-aerated. Status post bilateral ocular lens implants. The included ocular globes and orbital contents are non-suspicious. OTHER: None. CT CERVICAL SPINE FINDINGS ALIGNMENT: Vertebral bodies in alignment.  Straightened lordosis. SKULL BASE AND VERTEBRAE: No acute fracture. Old C2 fracture with intact screw, no periprosthetic lucency. Status post C5 through C7 ACDF, incorporated interbody strut. Facet screws C5 through C7, pedicle screws to the level of T2. Posterior hardware is intact, no periprosthetic lucency. Partially incorporated posterior bone graft material. Multilevel mild degenerative discs. C1-2 articulation maintained with mild arthropathy. Osteopenia without destructive bony lesions. SOFT TISSUES AND SPINAL CANAL: Nonacute. Moderate calcific atherosclerosis of LEFT carotid bulb. DISC LEVELS: Mild canal stenosis C3-4. C3-4 severe neural foraminal narrowing, moderate to severe at C5-6, severe at C6-7. UPPER CHEST: Lung apices are clear. OTHER: None. IMPRESSION: CT HEAD: Large LEFT frontal scalp hematoma.  No skull fracture. No acute intracranial process; negative  CT HEAD for age. CT CERVICAL SPINE: No acute fracture or malalignment. Extensive cervical thoracic spinal hardware without hardware failure. Electronically Signed   By: Elon Alas M.D.   On: 09/22/2016 01:02   Dg Shoulder Left  Result Date: 09/22/2016 CLINICAL DATA:  [place] on mass.  Left shoulder hurts. EXAM: LEFT SHOULDER - 2+ VIEW COMPARISON:  None. FINDINGS: Examination is limited by patient positioning. Partially visualized sternal wires.  Atherosclerotic vascular disease of the aorta. There is stent material at the aortic arch. There is no acute fracture or dislocation. Marked degenerative changes of the left shoulder with effacement of the sub acromial space. Probable old fracture of the left sixth rib. There is partially visualized hardware within the cervical spine. IMPRESSION: 1. No acute osseous abnormality allowing for suboptimal positioning. 2. Probable old fracture of the left sixth rib. Electronically Signed   By: Donavan Foil M.D.   On: 09/22/2016 00:54   Dg Hip Unilat W Or Wo Pelvis 2-3 Views Right  Result Date: 09/22/2016 CLINICAL DATA:  Fall, head injury, hip pain EXAM: DG HIP (WITH OR WITHOUT PELVIS) 2-3V RIGHT COMPARISON:  CT 07/11/2016 FINDINGS: There are old fracture deformities of the right inferior and superior pubic rami. No definite acute displaced fracture or dislocation is visualized. There is SI joint arthritic change. Prominent bone spur off of the right iliac bone. Vascular calcifications. Degenerative changes of the lumbar spine. IMPRESSION: 1. No definite acute osseous abnormality. CT follow-up may be performed continued suspicion for hip fracture. 2. Old right superior and inferior pubic rami fractures. Electronically Signed   By: Donavan Foil M.D.   On: 09/22/2016 00:58    TELE: Atrial flutter with ventricular rate 60-80.    ASSESSMENT AND PLAN  A pleasant 82 year old patient who recently moved here from [facility], followed by Dr Percival Spanish, with h/o CAD s/p previous PCIs and CABG (2011 in [place]), HTN, HLD, PVD s/p PCI subclavian artery (2013), carotid artery disease s/p remote CEA, DMT2 w/ peripheral neuropathy with frequent falls, LV dysfunction with no clinical CHF and persistent atrial fib/flutter on Eliquis who presented to [facility] on 09/21/16 with syncope.  It is described in the note that her HR down to 37, however this was never documented, the lowest heart rate was in 50s. on telemetry on the floor 50-118  BPM on a low dose carvedilol 3.125 mg po BID.  EP consulted the patient yesterday recommended 30 day event monitor. We will arrange for that.  The patient had discussion with her primary doctor Dr Ree Kida and they have decided to discontinue carvedilol as well as [place] as she has fallen several time and had head injuries in the last couple months. Since her carvedilol was discontinued and her blood pressure has increased, the would recommend to increase lisinopril from 2.5-5 mg daily.  She can be discharged today.  Signed, Ena Dawley MD, [facility] 09/23/2016

## 2016-09-25 ENCOUNTER — Telehealth: Payer: Self-pay | Admitting: Cardiology

## 2016-09-25 DIAGNOSIS — R55 Syncope and collapse: Secondary | ICD-10-CM

## 2016-09-25 NOTE — Telephone Encounter (Signed)
Order for monitor placed and sent to [facility] for scheduling. Pt made aware.

## 2016-09-25 NOTE — Telephone Encounter (Signed)
Per d/c from [facility] pt was to have a cardiac event monitor--pt has not heard anything concerning this.

## 2016-09-25 NOTE — Addendum Note (Signed)
Addended by: Cristopher Estimable on: 09/25/2016 12:48 PM   Modules accepted: Orders

## 2016-09-29 DIAGNOSIS — Z23 Encounter for immunization: Secondary | ICD-10-CM | POA: Diagnosis not present

## 2016-09-29 DIAGNOSIS — I480 Paroxysmal atrial fibrillation: Secondary | ICD-10-CM | POA: Diagnosis not present

## 2016-09-29 DIAGNOSIS — I129 Hypertensive chronic kidney disease with stage 1 through stage 4 chronic kidney disease, or unspecified chronic kidney disease: Secondary | ICD-10-CM | POA: Diagnosis not present

## 2016-09-29 DIAGNOSIS — R296 Repeated falls: Secondary | ICD-10-CM | POA: Diagnosis not present

## 2016-09-29 DIAGNOSIS — Z7984 Long term (current) use of oral hypoglycemic drugs: Secondary | ICD-10-CM | POA: Diagnosis not present

## 2016-09-29 DIAGNOSIS — N183 Chronic kidney disease, stage 3 (moderate): Secondary | ICD-10-CM | POA: Diagnosis not present

## 2016-09-29 DIAGNOSIS — E1140 Type 2 diabetes mellitus with diabetic neuropathy, unspecified: Secondary | ICD-10-CM | POA: Diagnosis not present

## 2016-09-29 DIAGNOSIS — E1121 Type 2 diabetes mellitus with diabetic nephropathy: Secondary | ICD-10-CM | POA: Diagnosis not present

## 2016-09-30 DIAGNOSIS — M6281 Muscle weakness (generalized): Secondary | ICD-10-CM | POA: Diagnosis not present

## 2016-09-30 DIAGNOSIS — R296 Repeated falls: Secondary | ICD-10-CM | POA: Diagnosis not present

## 2016-09-30 DIAGNOSIS — M25521 Pain in right elbow: Secondary | ICD-10-CM | POA: Diagnosis not present

## 2016-09-30 DIAGNOSIS — R2689 Other abnormalities of gait and mobility: Secondary | ICD-10-CM | POA: Diagnosis not present

## 2016-10-06 ENCOUNTER — Encounter: Payer: Self-pay | Admitting: Cardiology

## 2016-10-07 ENCOUNTER — Ambulatory Visit (INDEPENDENT_AMBULATORY_CARE_PROVIDER_SITE_OTHER): Payer: Medicare Other

## 2016-10-07 ENCOUNTER — Telehealth: Payer: Self-pay | Admitting: Physician Assistant

## 2016-10-07 DIAGNOSIS — R55 Syncope and collapse: Secondary | ICD-10-CM

## 2016-10-07 NOTE — Telephone Encounter (Signed)
[employer] event monitoring company called after-hours answering line to report abnormal tracing. This is their first documentation of afib at 90bpm for 60 seconds. Monitoring company spoke with patient who did not report any symptoms at the time this occurred. Monitor was ordered for recent syncope/bradycardia. Per review of chart, patient has history of afib but anticoagulation was recently held due to fall risk. Will forward to ordering provider Dr. Percival Spanish to make him aware. Adely Facer PA-C

## 2016-10-12 NOTE — Progress Notes (Signed)
Cardiology Office Note   Date:  70/35/0093   ID:  Jessica King, DOB [birth year], MRN [id number]  PCP:  Mathews Argyle, MD  Cardiologist:   Minus Breeding, MD   Chief Complaint  Patient presents with  . Loss of Consciousness      History of Present Illness: Jessica King is a 82 y.o. female who presents for evaluation of known coronary disease, atrial fib and syncope. She has moved from [place].   She has a history of CAD and I last saw her last year and she had a negative perfusion study.  Since that time she was in the hospital last month with syncope.  I reviewed these records. It was thought that this could have been a brady event and Coreg and Elquis were held.  A pacemaker was suggested but the patient opted for an event recorder.  We did see her in consultation.  I reviewed these records.  Since these events she has had no new syncope.  Her daughter thinks that this was related to taking 10 mg of Ambien and hydrocodone and falling.  She falls frequently.  The patient denies any new symptoms such as chest discomfort, neck or arm discomfort. There has been no new shortness of breath, PND or orthopnea. There have been no reported palpitations, presyncope or syncope.  Past Medical History:  Diagnosis Date  . Abdominal fibromatosis   . Allergic rhinitis   . CAD (coronary artery disease)   . Chronic systolic congestive heart failure ([place])   . Coronary artery disease    Cardiac cath 1999, 2007, 2010, Taxus stent x 2 to LAD 2007.   . Diabetes mellitus without complication ([place])   . Diverticulosis   . Hyperlipidemia   . Hypertension   . Iron deficiency anemia   . Peripheral neuropathy (HCC)    Frequent fall  . PVD (peripheral vascular disease) ([place])    Subclavian stent May 2013 (subclavian steal)    Past Surgical History:  Procedure Laterality Date  . BACK SURGERY     x 3 or 4 lumbar, cervical x 2  . CEA    . CORONARY ARTERY BYPASS GRAFT  2011   x 3   . Coronary  Stents x3       Current Outpatient Prescriptions  Medication Sig Dispense Refill  . Ca Carbonate-Mag Hydroxide (ROLAIDS) 550-110 MG CHEW Chew 1 tablet by mouth as needed (for heart burn).    . clonazePAM (KLONOPIN) 0.5 MG tablet Take 0.5 mg by mouth 2 (two) times daily. Takes 1 tab in daytime as needed and 1 tab every bedtime    . CRANBERRY PO Take 4,200 mg by mouth daily.    Marland Kitchen gabapentin (NEURONTIN) 300 MG capsule Take 300-600 mg by mouth See admin instructions. Take 1 capsule at 8 am and 12 pm and then take 2 capsules at bedtime    . HYDROcodone-acetaminophen (NORCO) 7.5-325 MG per tablet Take 1 tablet by mouth every 6 (six) hours as needed for moderate pain.     Marland Kitchen lisinopril (PRINIVIL,ZESTRIL) 5 MG tablet Take 1 tablet (5 mg total) by mouth daily. 30 tablet 0  . loratadine (CLARITIN) 10 MG tablet Take 10 mg by mouth daily as needed for allergies.     . metFORMIN (GLUCOPHAGE) 1000 MG tablet Take 1,000 mg by mouth 2 (two) times daily with a meal.    . methocarbamol (ROBAXIN) 750 MG tablet Take 750 mg by mouth every 6 (six) hours as needed for muscle spasms.     Marland Kitchen  Multiple Vitamin (MULTIVITAMIN WITH MINERALS) TABS tablet Take 1 tablet by mouth daily.    Marland Kitchen PARoxetine (PAXIL) 20 MG tablet Take 20 mg by mouth daily.    . simvastatin (ZOCOR) 40 MG tablet Take 40 mg by mouth daily.    Marland Kitchen zolpidem (AMBIEN) 5 MG tablet Take 1 tablet (5 mg total) by mouth at bedtime as needed for sleep. 30 tablet 0  . aspirin EC 81 MG tablet Take 1 tablet (81 mg total) by mouth daily. 30 tablet 11   No current facility-administered medications for this visit.     Allergies:   Sulfa antibiotics    ROS:  Please see the history of present illness.   Otherwise, review of systems are positive for back pain, joint pains.   All other systems are reviewed and negative.    PHYSICAL EXAM: VS:  BP 130/68 (BP Location: Left Arm, Patient Position: Sitting, Cuff Size: Normal)   Pulse 80   Ht 5\' 3"  (1.6 m)   Wt 153 lb 12.8  oz (69.8 kg)   BMI 27.24 kg/m  , BMI Body mass index is 27.24 kg/m. GENERAL:  Well appearing, but somewhat frail HEENT:  Pupils equal round and reactive, fundi not visualized, oral mucosa unremarkable NECK:  No jugular venous distention, waveform within normal limits, carotid upstroke brisk and symmetric, bilateral bruits, no thyromegaly LUNGS:  Clear to auscultation bilaterally CHEST:  Unremarkable HEART:  PMI not displaced or sustained,S1 and S2 within normal limits, no S3, no S4, no clicks, no rubs, no murmurs ABD:  Flat, positive bowel sounds normal in frequency in pitch, no bruits, no rebound, no guarding, no midline pulsatile mass, no hepatomegaly, no splenomegaly EXT:  2 plus pulses throughout, no edema, no cyanosis no clubbing, right leg swelling with healing anterior tibial wound.     EKG:  EKG is   not ordered today.    Recent Labs: 09/22/2016: B Natriuretic Peptide 201.8; BUN 20; Creatinine, Ser 0.89; Hemoglobin 10.0; Platelets 198; Potassium 4.4; Sodium 138    Lipid Panel No results found for: CHOL, TRIG, HDL, CHOLHDL, VLDL, LDLCALC, LDLDIRECT    Wt Readings from Last 3 Encounters:  10/13/16 153 lb 12.8 oz (69.8 kg)  09/23/16 149 lb 14.4 oz (68 kg)  07/11/16 147 lb (66.7 kg)      Other studies Reviewed: Additional studies/ records that were reviewed today include: Hospital records and event monitor. Review of the above records demonstrates:  Please see elsewhere in the note.     ASSESSMENT AND PLAN:  SYNCOPE:  I did review the event monitor and so far there've been no bradycardia arrhythmias. This might of been a mechanical fall rather than syncope. No change in therapy is indicated. There is no indication at this point for pacemaker.  CAD:   She had no ischemia and the EF was normal on recent study.  Now that she's off blood thinner she should be back on aspirin 81 mg daily.  No further testing is indicated.    BRUIT:  She had mild plaque on Doppler in June.   I will follow again in one year.   HTN:  Her blood pressure is OK.  No change in therapy.   DM:  I will defer to Mathews Argyle, MD   ATRIAL FIB:  Ms. Emmersyn Kratzke has a [id number] - VASc score of 7.  We had a long discussion about this in the office. She and her daughter understand the risk of stroke. However, she is clearly  having a large number of falls. We spent a long time talking about this and how she might avoid this. Some of this is related to activity such as bending over. However, some of it is clearly related to difficulty with neuropathy and her heels not supporting her and she is looking for special shoes. She's working with physical therapy. If she ever gets to the point she's not falling frequently he could consider restarting the Eliquis.  DYSLIPIDEMIA:  Given her significant vascular history I will suggest a goal LDL less than 70 and I will again defer to Mathews Argyle, MD   Current medicines are reviewed at length with the patient today.  The patient does not have concerns regarding medicines.  The following changes have been made:  Restart ASA  Labs/ tests ordered today include:  None  No orders of the defined types were placed in this encounter.    Disposition:   FU with me in 4 months.    Signed, Minus Breeding, MD  10/15/2016 4:09 PM    [facility]

## 2016-10-13 ENCOUNTER — Ambulatory Visit (INDEPENDENT_AMBULATORY_CARE_PROVIDER_SITE_OTHER): Payer: Medicare Other | Admitting: Cardiology

## 2016-10-13 VITALS — BP 130/68 | HR 80 | Ht 63.0 in | Wt 153.8 lb

## 2016-10-13 DIAGNOSIS — I2510 Atherosclerotic heart disease of native coronary artery without angina pectoris: Secondary | ICD-10-CM

## 2016-10-13 DIAGNOSIS — R55 Syncope and collapse: Secondary | ICD-10-CM

## 2016-10-13 MED ORDER — ASPIRIN EC 81 MG PO TBEC: 81.0000 mg | DELAYED_RELEASE_TABLET | Freq: Every day | ORAL | 11 refills | Status: AC

## 2016-10-13 NOTE — Patient Instructions (Signed)
Medication Instructions:  START- Aspirin 81 mg daily  Labwork: None Ordered  Testing/Procedures: None Ordered  Follow-Up: Your physician recommends that you schedule a follow-up appointment in: 4 Months.   Any Other Special Instructions Will Be Listed Below (If Applicable).            Happy Thanksgiving  If you need a refill on your cardiac medications before your next appointment, please call your pharmacy.

## 2016-10-14 DIAGNOSIS — M6281 Muscle weakness (generalized): Secondary | ICD-10-CM | POA: Diagnosis not present

## 2016-10-14 DIAGNOSIS — R2689 Other abnormalities of gait and mobility: Secondary | ICD-10-CM | POA: Diagnosis not present

## 2016-10-15 ENCOUNTER — Encounter: Payer: Self-pay | Admitting: Cardiology

## 2016-10-16 DIAGNOSIS — R2689 Other abnormalities of gait and mobility: Secondary | ICD-10-CM | POA: Diagnosis not present

## 2016-10-16 DIAGNOSIS — M6281 Muscle weakness (generalized): Secondary | ICD-10-CM | POA: Diagnosis not present

## 2016-10-17 DIAGNOSIS — N183 Chronic kidney disease, stage 3 (moderate): Secondary | ICD-10-CM | POA: Diagnosis not present

## 2016-10-17 DIAGNOSIS — Z0000 Encounter for general adult medical examination without abnormal findings: Secondary | ICD-10-CM | POA: Diagnosis not present

## 2016-10-17 DIAGNOSIS — R2689 Other abnormalities of gait and mobility: Secondary | ICD-10-CM | POA: Diagnosis not present

## 2016-10-17 DIAGNOSIS — Z7984 Long term (current) use of oral hypoglycemic drugs: Secondary | ICD-10-CM | POA: Diagnosis not present

## 2016-10-17 DIAGNOSIS — F325 Major depressive disorder, single episode, in full remission: Secondary | ICD-10-CM | POA: Diagnosis not present

## 2016-10-17 DIAGNOSIS — I129 Hypertensive chronic kidney disease with stage 1 through stage 4 chronic kidney disease, or unspecified chronic kidney disease: Secondary | ICD-10-CM | POA: Diagnosis not present

## 2016-10-17 DIAGNOSIS — M6281 Muscle weakness (generalized): Secondary | ICD-10-CM | POA: Diagnosis not present

## 2016-10-17 DIAGNOSIS — E1121 Type 2 diabetes mellitus with diabetic nephropathy: Secondary | ICD-10-CM | POA: Diagnosis not present

## 2016-10-17 DIAGNOSIS — Z1389 Encounter for screening for other disorder: Secondary | ICD-10-CM | POA: Diagnosis not present

## 2016-10-20 DIAGNOSIS — R2689 Other abnormalities of gait and mobility: Secondary | ICD-10-CM | POA: Diagnosis not present

## 2016-10-20 DIAGNOSIS — M6281 Muscle weakness (generalized): Secondary | ICD-10-CM | POA: Diagnosis not present

## 2016-10-21 DIAGNOSIS — R2689 Other abnormalities of gait and mobility: Secondary | ICD-10-CM | POA: Diagnosis not present

## 2016-10-21 DIAGNOSIS — M6281 Muscle weakness (generalized): Secondary | ICD-10-CM | POA: Diagnosis not present

## 2016-10-22 DIAGNOSIS — R2689 Other abnormalities of gait and mobility: Secondary | ICD-10-CM | POA: Diagnosis not present

## 2016-10-22 DIAGNOSIS — M6281 Muscle weakness (generalized): Secondary | ICD-10-CM | POA: Diagnosis not present

## 2016-10-27 DIAGNOSIS — R2689 Other abnormalities of gait and mobility: Secondary | ICD-10-CM | POA: Diagnosis not present

## 2016-10-27 DIAGNOSIS — M6281 Muscle weakness (generalized): Secondary | ICD-10-CM | POA: Diagnosis not present

## 2016-10-28 DIAGNOSIS — R2689 Other abnormalities of gait and mobility: Secondary | ICD-10-CM | POA: Diagnosis not present

## 2016-10-28 DIAGNOSIS — M6281 Muscle weakness (generalized): Secondary | ICD-10-CM | POA: Diagnosis not present

## 2016-10-30 DIAGNOSIS — R2689 Other abnormalities of gait and mobility: Secondary | ICD-10-CM | POA: Diagnosis not present

## 2016-10-30 DIAGNOSIS — M6281 Muscle weakness (generalized): Secondary | ICD-10-CM | POA: Diagnosis not present

## 2016-11-03 DIAGNOSIS — M6281 Muscle weakness (generalized): Secondary | ICD-10-CM | POA: Diagnosis not present

## 2016-11-03 DIAGNOSIS — R2689 Other abnormalities of gait and mobility: Secondary | ICD-10-CM | POA: Diagnosis not present

## 2016-11-04 DIAGNOSIS — M6281 Muscle weakness (generalized): Secondary | ICD-10-CM | POA: Diagnosis not present

## 2016-11-04 DIAGNOSIS — R2689 Other abnormalities of gait and mobility: Secondary | ICD-10-CM | POA: Diagnosis not present

## 2016-11-05 DIAGNOSIS — R2689 Other abnormalities of gait and mobility: Secondary | ICD-10-CM | POA: Diagnosis not present

## 2016-11-05 DIAGNOSIS — M6281 Muscle weakness (generalized): Secondary | ICD-10-CM | POA: Diagnosis not present

## 2016-11-07 DIAGNOSIS — M6281 Muscle weakness (generalized): Secondary | ICD-10-CM | POA: Diagnosis not present

## 2016-11-07 DIAGNOSIS — R2689 Other abnormalities of gait and mobility: Secondary | ICD-10-CM | POA: Diagnosis not present

## 2016-11-11 DIAGNOSIS — R2689 Other abnormalities of gait and mobility: Secondary | ICD-10-CM | POA: Diagnosis not present

## 2016-11-11 DIAGNOSIS — M6281 Muscle weakness (generalized): Secondary | ICD-10-CM | POA: Diagnosis not present

## 2016-11-12 DIAGNOSIS — R2689 Other abnormalities of gait and mobility: Secondary | ICD-10-CM | POA: Diagnosis not present

## 2016-11-12 DIAGNOSIS — M6281 Muscle weakness (generalized): Secondary | ICD-10-CM | POA: Diagnosis not present

## 2016-11-13 DIAGNOSIS — R2689 Other abnormalities of gait and mobility: Secondary | ICD-10-CM | POA: Diagnosis not present

## 2016-11-13 DIAGNOSIS — M6281 Muscle weakness (generalized): Secondary | ICD-10-CM | POA: Diagnosis not present

## 2016-11-18 ENCOUNTER — Telehealth: Payer: Self-pay | Admitting: Cardiology

## 2016-11-18 NOTE — Telephone Encounter (Signed)
Malachy Mood (daughter) is calling about her mother's test results . Thanks

## 2016-11-18 NOTE — Telephone Encounter (Signed)
Returned call to patient's daughter Malachy Mood monitor results given.

## 2016-11-26 DIAGNOSIS — M25521 Pain in right elbow: Secondary | ICD-10-CM | POA: Diagnosis not present

## 2016-11-26 DIAGNOSIS — E1140 Type 2 diabetes mellitus with diabetic neuropathy, unspecified: Secondary | ICD-10-CM | POA: Diagnosis not present

## 2016-11-26 DIAGNOSIS — S52501A Unspecified fracture of the lower end of right radius, initial encounter for closed fracture: Secondary | ICD-10-CM | POA: Diagnosis not present

## 2016-11-26 DIAGNOSIS — M542 Cervicalgia: Secondary | ICD-10-CM | POA: Diagnosis not present

## 2016-11-26 DIAGNOSIS — S0990XA Unspecified injury of head, initial encounter: Secondary | ICD-10-CM | POA: Diagnosis not present

## 2016-11-26 DIAGNOSIS — Z7982 Long term (current) use of aspirin: Secondary | ICD-10-CM | POA: Diagnosis not present

## 2016-11-26 DIAGNOSIS — E785 Hyperlipidemia, unspecified: Secondary | ICD-10-CM | POA: Diagnosis not present

## 2016-11-26 DIAGNOSIS — R51 Headache: Secondary | ICD-10-CM | POA: Diagnosis not present

## 2016-11-26 DIAGNOSIS — M79641 Pain in right hand: Secondary | ICD-10-CM | POA: Diagnosis not present

## 2016-11-26 DIAGNOSIS — S52571A Other intraarticular fracture of lower end of right radius, initial encounter for closed fracture: Secondary | ICD-10-CM | POA: Diagnosis not present

## 2016-11-26 DIAGNOSIS — R0781 Pleurodynia: Secondary | ICD-10-CM | POA: Diagnosis not present

## 2016-11-26 DIAGNOSIS — I4891 Unspecified atrial fibrillation: Secondary | ICD-10-CM | POA: Diagnosis not present

## 2016-11-26 DIAGNOSIS — S42211A Unspecified displaced fracture of surgical neck of right humerus, initial encounter for closed fracture: Secondary | ICD-10-CM | POA: Diagnosis not present

## 2016-11-26 DIAGNOSIS — S52611A Displaced fracture of right ulna styloid process, initial encounter for closed fracture: Secondary | ICD-10-CM | POA: Diagnosis not present

## 2016-12-08 DIAGNOSIS — R2681 Unsteadiness on feet: Secondary | ICD-10-CM | POA: Diagnosis not present

## 2016-12-08 DIAGNOSIS — R278 Other lack of coordination: Secondary | ICD-10-CM | POA: Diagnosis not present

## 2016-12-08 DIAGNOSIS — M6281 Muscle weakness (generalized): Secondary | ICD-10-CM | POA: Diagnosis not present

## 2016-12-08 DIAGNOSIS — Z9181 History of falling: Secondary | ICD-10-CM | POA: Diagnosis not present

## 2016-12-09 DIAGNOSIS — Z9181 History of falling: Secondary | ICD-10-CM | POA: Diagnosis not present

## 2016-12-09 DIAGNOSIS — R278 Other lack of coordination: Secondary | ICD-10-CM | POA: Diagnosis not present

## 2016-12-09 DIAGNOSIS — R2681 Unsteadiness on feet: Secondary | ICD-10-CM | POA: Diagnosis not present

## 2016-12-09 DIAGNOSIS — M6281 Muscle weakness (generalized): Secondary | ICD-10-CM | POA: Diagnosis not present

## 2016-12-10 DIAGNOSIS — Z9181 History of falling: Secondary | ICD-10-CM | POA: Diagnosis not present

## 2016-12-10 DIAGNOSIS — R278 Other lack of coordination: Secondary | ICD-10-CM | POA: Diagnosis not present

## 2016-12-10 DIAGNOSIS — M6281 Muscle weakness (generalized): Secondary | ICD-10-CM | POA: Diagnosis not present

## 2016-12-10 DIAGNOSIS — R2681 Unsteadiness on feet: Secondary | ICD-10-CM | POA: Diagnosis not present

## 2016-12-11 DIAGNOSIS — R2681 Unsteadiness on feet: Secondary | ICD-10-CM | POA: Diagnosis not present

## 2016-12-11 DIAGNOSIS — Z9181 History of falling: Secondary | ICD-10-CM | POA: Diagnosis not present

## 2016-12-11 DIAGNOSIS — R278 Other lack of coordination: Secondary | ICD-10-CM | POA: Diagnosis not present

## 2016-12-11 DIAGNOSIS — M6281 Muscle weakness (generalized): Secondary | ICD-10-CM | POA: Diagnosis not present

## 2016-12-12 DIAGNOSIS — M6281 Muscle weakness (generalized): Secondary | ICD-10-CM | POA: Diagnosis not present

## 2016-12-12 DIAGNOSIS — Z9181 History of falling: Secondary | ICD-10-CM | POA: Diagnosis not present

## 2016-12-12 DIAGNOSIS — R2681 Unsteadiness on feet: Secondary | ICD-10-CM | POA: Diagnosis not present

## 2016-12-12 DIAGNOSIS — R278 Other lack of coordination: Secondary | ICD-10-CM | POA: Diagnosis not present

## 2016-12-13 DIAGNOSIS — Z9181 History of falling: Secondary | ICD-10-CM | POA: Diagnosis not present

## 2016-12-13 DIAGNOSIS — R2681 Unsteadiness on feet: Secondary | ICD-10-CM | POA: Diagnosis not present

## 2016-12-13 DIAGNOSIS — M6281 Muscle weakness (generalized): Secondary | ICD-10-CM | POA: Diagnosis not present

## 2016-12-13 DIAGNOSIS — R278 Other lack of coordination: Secondary | ICD-10-CM | POA: Diagnosis not present

## 2016-12-16 DIAGNOSIS — M6281 Muscle weakness (generalized): Secondary | ICD-10-CM | POA: Diagnosis not present

## 2016-12-16 DIAGNOSIS — R2681 Unsteadiness on feet: Secondary | ICD-10-CM | POA: Diagnosis not present

## 2016-12-16 DIAGNOSIS — Z9181 History of falling: Secondary | ICD-10-CM | POA: Diagnosis not present

## 2016-12-16 DIAGNOSIS — R278 Other lack of coordination: Secondary | ICD-10-CM | POA: Diagnosis not present

## 2016-12-19 DIAGNOSIS — R2681 Unsteadiness on feet: Secondary | ICD-10-CM | POA: Diagnosis not present

## 2016-12-19 DIAGNOSIS — R278 Other lack of coordination: Secondary | ICD-10-CM | POA: Diagnosis not present

## 2016-12-19 DIAGNOSIS — M6281 Muscle weakness (generalized): Secondary | ICD-10-CM | POA: Diagnosis not present

## 2016-12-19 DIAGNOSIS — Z9181 History of falling: Secondary | ICD-10-CM | POA: Diagnosis not present

## 2016-12-22 DIAGNOSIS — M6281 Muscle weakness (generalized): Secondary | ICD-10-CM | POA: Diagnosis not present

## 2016-12-22 DIAGNOSIS — R2681 Unsteadiness on feet: Secondary | ICD-10-CM | POA: Diagnosis not present

## 2016-12-22 DIAGNOSIS — Z9181 History of falling: Secondary | ICD-10-CM | POA: Diagnosis not present

## 2016-12-22 DIAGNOSIS — R278 Other lack of coordination: Secondary | ICD-10-CM | POA: Diagnosis not present

## 2016-12-23 DIAGNOSIS — M6281 Muscle weakness (generalized): Secondary | ICD-10-CM | POA: Diagnosis not present

## 2016-12-23 DIAGNOSIS — R278 Other lack of coordination: Secondary | ICD-10-CM | POA: Diagnosis not present

## 2016-12-23 DIAGNOSIS — Z9181 History of falling: Secondary | ICD-10-CM | POA: Diagnosis not present

## 2016-12-23 DIAGNOSIS — R2681 Unsteadiness on feet: Secondary | ICD-10-CM | POA: Diagnosis not present

## 2016-12-24 DIAGNOSIS — M6281 Muscle weakness (generalized): Secondary | ICD-10-CM | POA: Diagnosis not present

## 2016-12-24 DIAGNOSIS — R2681 Unsteadiness on feet: Secondary | ICD-10-CM | POA: Diagnosis not present

## 2016-12-24 DIAGNOSIS — Z9181 History of falling: Secondary | ICD-10-CM | POA: Diagnosis not present

## 2016-12-24 DIAGNOSIS — R278 Other lack of coordination: Secondary | ICD-10-CM | POA: Diagnosis not present

## 2016-12-25 DIAGNOSIS — M6281 Muscle weakness (generalized): Secondary | ICD-10-CM | POA: Diagnosis not present

## 2016-12-25 DIAGNOSIS — R278 Other lack of coordination: Secondary | ICD-10-CM | POA: Diagnosis not present

## 2016-12-25 DIAGNOSIS — R2681 Unsteadiness on feet: Secondary | ICD-10-CM | POA: Diagnosis not present

## 2016-12-25 DIAGNOSIS — Z9181 History of falling: Secondary | ICD-10-CM | POA: Diagnosis not present

## 2016-12-26 DIAGNOSIS — R2681 Unsteadiness on feet: Secondary | ICD-10-CM | POA: Diagnosis not present

## 2016-12-26 DIAGNOSIS — Z9181 History of falling: Secondary | ICD-10-CM | POA: Diagnosis not present

## 2016-12-26 DIAGNOSIS — M6281 Muscle weakness (generalized): Secondary | ICD-10-CM | POA: Diagnosis not present

## 2016-12-26 DIAGNOSIS — R278 Other lack of coordination: Secondary | ICD-10-CM | POA: Diagnosis not present

## 2016-12-29 DIAGNOSIS — R278 Other lack of coordination: Secondary | ICD-10-CM | POA: Diagnosis not present

## 2016-12-29 DIAGNOSIS — M6281 Muscle weakness (generalized): Secondary | ICD-10-CM | POA: Diagnosis not present

## 2016-12-29 DIAGNOSIS — R2681 Unsteadiness on feet: Secondary | ICD-10-CM | POA: Diagnosis not present

## 2016-12-29 DIAGNOSIS — Z9181 History of falling: Secondary | ICD-10-CM | POA: Diagnosis not present

## 2016-12-30 DIAGNOSIS — R2681 Unsteadiness on feet: Secondary | ICD-10-CM | POA: Diagnosis not present

## 2016-12-30 DIAGNOSIS — Z9181 History of falling: Secondary | ICD-10-CM | POA: Diagnosis not present

## 2016-12-30 DIAGNOSIS — R278 Other lack of coordination: Secondary | ICD-10-CM | POA: Diagnosis not present

## 2016-12-30 DIAGNOSIS — M6281 Muscle weakness (generalized): Secondary | ICD-10-CM | POA: Diagnosis not present

## 2017-01-02 DIAGNOSIS — R278 Other lack of coordination: Secondary | ICD-10-CM | POA: Diagnosis not present

## 2017-01-02 DIAGNOSIS — Z9181 History of falling: Secondary | ICD-10-CM | POA: Diagnosis not present

## 2017-01-02 DIAGNOSIS — R2681 Unsteadiness on feet: Secondary | ICD-10-CM | POA: Diagnosis not present

## 2017-01-02 DIAGNOSIS — M6281 Muscle weakness (generalized): Secondary | ICD-10-CM | POA: Diagnosis not present

## 2017-01-07 DIAGNOSIS — S42209A Unspecified fracture of upper end of unspecified humerus, initial encounter for closed fracture: Secondary | ICD-10-CM | POA: Diagnosis not present

## 2017-01-07 DIAGNOSIS — S62101A Fracture of unspecified carpal bone, right wrist, initial encounter for closed fracture: Secondary | ICD-10-CM | POA: Diagnosis not present

## 2017-01-09 DIAGNOSIS — M6281 Muscle weakness (generalized): Secondary | ICD-10-CM | POA: Diagnosis not present

## 2017-01-09 DIAGNOSIS — R2681 Unsteadiness on feet: Secondary | ICD-10-CM | POA: Diagnosis not present

## 2017-01-09 DIAGNOSIS — R278 Other lack of coordination: Secondary | ICD-10-CM | POA: Diagnosis not present

## 2017-01-09 DIAGNOSIS — Z9181 History of falling: Secondary | ICD-10-CM | POA: Diagnosis not present

## 2017-01-11 DIAGNOSIS — R278 Other lack of coordination: Secondary | ICD-10-CM | POA: Diagnosis not present

## 2017-01-11 DIAGNOSIS — M6281 Muscle weakness (generalized): Secondary | ICD-10-CM | POA: Diagnosis not present

## 2017-01-11 DIAGNOSIS — Z9181 History of falling: Secondary | ICD-10-CM | POA: Diagnosis not present

## 2017-01-11 DIAGNOSIS — R2681 Unsteadiness on feet: Secondary | ICD-10-CM | POA: Diagnosis not present

## 2017-01-12 DIAGNOSIS — R2681 Unsteadiness on feet: Secondary | ICD-10-CM | POA: Diagnosis not present

## 2017-01-12 DIAGNOSIS — M6281 Muscle weakness (generalized): Secondary | ICD-10-CM | POA: Diagnosis not present

## 2017-01-12 DIAGNOSIS — Z9181 History of falling: Secondary | ICD-10-CM | POA: Diagnosis not present

## 2017-01-12 DIAGNOSIS — R278 Other lack of coordination: Secondary | ICD-10-CM | POA: Diagnosis not present

## 2017-01-14 DIAGNOSIS — M6281 Muscle weakness (generalized): Secondary | ICD-10-CM | POA: Diagnosis not present

## 2017-01-14 DIAGNOSIS — R2681 Unsteadiness on feet: Secondary | ICD-10-CM | POA: Diagnosis not present

## 2017-01-14 DIAGNOSIS — Z9181 History of falling: Secondary | ICD-10-CM | POA: Diagnosis not present

## 2017-01-14 DIAGNOSIS — R278 Other lack of coordination: Secondary | ICD-10-CM | POA: Diagnosis not present

## 2017-01-15 DIAGNOSIS — R2681 Unsteadiness on feet: Secondary | ICD-10-CM | POA: Diagnosis not present

## 2017-01-15 DIAGNOSIS — M6281 Muscle weakness (generalized): Secondary | ICD-10-CM | POA: Diagnosis not present

## 2017-01-15 DIAGNOSIS — Z9181 History of falling: Secondary | ICD-10-CM | POA: Diagnosis not present

## 2017-01-15 DIAGNOSIS — R278 Other lack of coordination: Secondary | ICD-10-CM | POA: Diagnosis not present

## 2017-01-19 DIAGNOSIS — Z9181 History of falling: Secondary | ICD-10-CM | POA: Diagnosis not present

## 2017-01-19 DIAGNOSIS — R278 Other lack of coordination: Secondary | ICD-10-CM | POA: Diagnosis not present

## 2017-01-19 DIAGNOSIS — R2681 Unsteadiness on feet: Secondary | ICD-10-CM | POA: Diagnosis not present

## 2017-01-19 DIAGNOSIS — M6281 Muscle weakness (generalized): Secondary | ICD-10-CM | POA: Diagnosis not present

## 2017-01-20 DIAGNOSIS — Z9181 History of falling: Secondary | ICD-10-CM | POA: Diagnosis not present

## 2017-01-20 DIAGNOSIS — R278 Other lack of coordination: Secondary | ICD-10-CM | POA: Diagnosis not present

## 2017-01-20 DIAGNOSIS — M6281 Muscle weakness (generalized): Secondary | ICD-10-CM | POA: Diagnosis not present

## 2017-01-20 DIAGNOSIS — R2681 Unsteadiness on feet: Secondary | ICD-10-CM | POA: Diagnosis not present

## 2017-01-21 DIAGNOSIS — S62101A Fracture of unspecified carpal bone, right wrist, initial encounter for closed fracture: Secondary | ICD-10-CM | POA: Diagnosis not present

## 2017-01-21 DIAGNOSIS — S42209A Unspecified fracture of upper end of unspecified humerus, initial encounter for closed fracture: Secondary | ICD-10-CM | POA: Diagnosis not present

## 2017-01-22 DIAGNOSIS — R278 Other lack of coordination: Secondary | ICD-10-CM | POA: Diagnosis not present

## 2017-01-22 DIAGNOSIS — M6281 Muscle weakness (generalized): Secondary | ICD-10-CM | POA: Diagnosis not present

## 2017-01-22 DIAGNOSIS — Z9181 History of falling: Secondary | ICD-10-CM | POA: Diagnosis not present

## 2017-01-22 DIAGNOSIS — R2681 Unsteadiness on feet: Secondary | ICD-10-CM | POA: Diagnosis not present

## 2017-01-23 DIAGNOSIS — R278 Other lack of coordination: Secondary | ICD-10-CM | POA: Diagnosis not present

## 2017-01-23 DIAGNOSIS — M6281 Muscle weakness (generalized): Secondary | ICD-10-CM | POA: Diagnosis not present

## 2017-01-23 DIAGNOSIS — Z9181 History of falling: Secondary | ICD-10-CM | POA: Diagnosis not present

## 2017-01-23 DIAGNOSIS — R2681 Unsteadiness on feet: Secondary | ICD-10-CM | POA: Diagnosis not present

## 2017-01-26 DIAGNOSIS — R278 Other lack of coordination: Secondary | ICD-10-CM | POA: Diagnosis not present

## 2017-01-26 DIAGNOSIS — M6281 Muscle weakness (generalized): Secondary | ICD-10-CM | POA: Diagnosis not present

## 2017-01-26 DIAGNOSIS — R2681 Unsteadiness on feet: Secondary | ICD-10-CM | POA: Diagnosis not present

## 2017-01-26 DIAGNOSIS — Z9181 History of falling: Secondary | ICD-10-CM | POA: Diagnosis not present

## 2017-01-27 DIAGNOSIS — R278 Other lack of coordination: Secondary | ICD-10-CM | POA: Diagnosis not present

## 2017-01-27 DIAGNOSIS — Z9181 History of falling: Secondary | ICD-10-CM | POA: Diagnosis not present

## 2017-01-27 DIAGNOSIS — M6281 Muscle weakness (generalized): Secondary | ICD-10-CM | POA: Diagnosis not present

## 2017-01-27 DIAGNOSIS — R2681 Unsteadiness on feet: Secondary | ICD-10-CM | POA: Diagnosis not present

## 2017-01-28 DIAGNOSIS — Z9181 History of falling: Secondary | ICD-10-CM | POA: Diagnosis not present

## 2017-01-28 DIAGNOSIS — R278 Other lack of coordination: Secondary | ICD-10-CM | POA: Diagnosis not present

## 2017-01-28 DIAGNOSIS — R2681 Unsteadiness on feet: Secondary | ICD-10-CM | POA: Diagnosis not present

## 2017-01-28 DIAGNOSIS — M6281 Muscle weakness (generalized): Secondary | ICD-10-CM | POA: Diagnosis not present

## 2017-01-30 DIAGNOSIS — R278 Other lack of coordination: Secondary | ICD-10-CM | POA: Diagnosis not present

## 2017-01-30 DIAGNOSIS — Z9181 History of falling: Secondary | ICD-10-CM | POA: Diagnosis not present

## 2017-01-30 DIAGNOSIS — R2681 Unsteadiness on feet: Secondary | ICD-10-CM | POA: Diagnosis not present

## 2017-01-30 DIAGNOSIS — M6281 Muscle weakness (generalized): Secondary | ICD-10-CM | POA: Diagnosis not present

## 2017-02-02 DIAGNOSIS — Z9181 History of falling: Secondary | ICD-10-CM | POA: Diagnosis not present

## 2017-02-02 DIAGNOSIS — M6281 Muscle weakness (generalized): Secondary | ICD-10-CM | POA: Diagnosis not present

## 2017-02-02 DIAGNOSIS — R278 Other lack of coordination: Secondary | ICD-10-CM | POA: Diagnosis not present

## 2017-02-02 DIAGNOSIS — R2681 Unsteadiness on feet: Secondary | ICD-10-CM | POA: Diagnosis not present

## 2017-02-03 DIAGNOSIS — R278 Other lack of coordination: Secondary | ICD-10-CM | POA: Diagnosis not present

## 2017-02-03 DIAGNOSIS — R2681 Unsteadiness on feet: Secondary | ICD-10-CM | POA: Diagnosis not present

## 2017-02-03 DIAGNOSIS — M6281 Muscle weakness (generalized): Secondary | ICD-10-CM | POA: Diagnosis not present

## 2017-02-03 DIAGNOSIS — Z9181 History of falling: Secondary | ICD-10-CM | POA: Diagnosis not present

## 2017-02-04 DIAGNOSIS — R278 Other lack of coordination: Secondary | ICD-10-CM | POA: Diagnosis not present

## 2017-02-04 DIAGNOSIS — M6281 Muscle weakness (generalized): Secondary | ICD-10-CM | POA: Diagnosis not present

## 2017-02-04 DIAGNOSIS — R2681 Unsteadiness on feet: Secondary | ICD-10-CM | POA: Diagnosis not present

## 2017-02-04 DIAGNOSIS — Z9181 History of falling: Secondary | ICD-10-CM | POA: Diagnosis not present

## 2017-02-05 DIAGNOSIS — Z9181 History of falling: Secondary | ICD-10-CM | POA: Diagnosis not present

## 2017-02-05 DIAGNOSIS — M6281 Muscle weakness (generalized): Secondary | ICD-10-CM | POA: Diagnosis not present

## 2017-02-05 DIAGNOSIS — R278 Other lack of coordination: Secondary | ICD-10-CM | POA: Diagnosis not present

## 2017-02-05 DIAGNOSIS — R2681 Unsteadiness on feet: Secondary | ICD-10-CM | POA: Diagnosis not present

## 2017-02-06 DIAGNOSIS — R278 Other lack of coordination: Secondary | ICD-10-CM | POA: Diagnosis not present

## 2017-02-06 DIAGNOSIS — R2681 Unsteadiness on feet: Secondary | ICD-10-CM | POA: Diagnosis not present

## 2017-02-06 DIAGNOSIS — M6281 Muscle weakness (generalized): Secondary | ICD-10-CM | POA: Diagnosis not present

## 2017-02-06 DIAGNOSIS — Z9181 History of falling: Secondary | ICD-10-CM | POA: Diagnosis not present

## 2017-02-09 NOTE — Progress Notes (Signed)
Cardiology Office Note   Date:  07/24/2352   ID:  Jessica King, DOB [birth year], MRN [id number]  PCP:  Mathews Argyle, MD  Cardiologist:   Minus Breeding, MD   Chief Complaint  Patient presents with  . Atrial Fibrillation      History of Present Illness: Jessica King is a 83 y.o. female who presents for evaluation of known coronary disease, atrial fib and syncope. She has moved from [place].  Since I last saw her she has had another fall and a broken arm.  However, she has had no syncope.  She does have some difficulty swallowing food and it feels like it gets stuck.  The patient denies any new symptoms such as chest discomfort, neck or arm discomfort. There has been no new shortness of breath, PND or orthopnea. There have been no reported palpitations, presyncope or syncope.  She did wear an Holter and had atrial fibrillation with a controlled rate and no sustained pauses.  Past Medical History:  Diagnosis Date  . Abdominal fibromatosis   . Allergic rhinitis   . CAD (coronary artery disease)   . Chronic systolic congestive heart failure ([place])   . Coronary artery disease    Cardiac cath 1999, 2007, 2010, Taxus stent x 2 to LAD 2007.   . Diabetes mellitus without complication ([place])   . Diverticulosis   . Hyperlipidemia   . Hypertension   . Iron deficiency anemia   . Peripheral neuropathy ([facility])    Frequent fall  . PVD (peripheral vascular disease) ([place])    Subclavian stent May 2013 (subclavian steal)    Past Surgical History:  Procedure Laterality Date  . BACK SURGERY     x 3 or 4 lumbar, cervical x 2  . CEA    . CORONARY ARTERY BYPASS GRAFT  2011   x 3   . Coronary Stents x3       Current Outpatient Prescriptions  Medication Sig Dispense Refill  . aspirin EC 81 MG tablet Take 1 tablet (81 mg total) by mouth daily. 30 tablet 11  . Ca Carbonate-Mag Hydroxide (ROLAIDS) 550-110 MG CHEW Chew 1 tablet by mouth as needed (for heart burn).    . clonazePAM  (KLONOPIN) 0.5 MG tablet Take 0.5 mg by mouth 2 (two) times daily. Takes 1 tab in daytime as needed and 1 tab every bedtime    . CRANBERRY PO Take 4,200 mg by mouth daily.    Marland Kitchen gabapentin (NEURONTIN) 300 MG capsule Take 300-600 mg by mouth See admin instructions. Take 1 capsule at 8 am and 12 pm and then take 2 capsules at bedtime    . HYDROcodone-acetaminophen (NORCO) 7.5-325 MG per tablet Take 1 tablet by mouth every 6 (six) hours as needed for moderate pain.     Marland Kitchen lisinopril (PRINIVIL,ZESTRIL) 5 MG tablet Take 1 tablet (5 mg total) by mouth daily. 30 tablet 0  . loratadine (CLARITIN) 10 MG tablet Take 10 mg by mouth daily as needed for allergies.     . metFORMIN (GLUCOPHAGE) 1000 MG tablet Take 1,000 mg by mouth 2 (two) times daily with a meal.    . methocarbamol (ROBAXIN) 750 MG tablet Take 750 mg by mouth every 6 (six) hours as needed for muscle spasms.     . Multiple Vitamin (MULTIVITAMIN WITH MINERALS) TABS tablet Take 1 tablet by mouth daily.    Marland Kitchen PARoxetine (PAXIL) 20 MG tablet Take 20 mg by mouth daily.    . simvastatin (ZOCOR) 40 MG  tablet Take 40 mg by mouth daily.    Marland Kitchen zolpidem (AMBIEN) 5 MG tablet Take 1 tablet (5 mg total) by mouth at bedtime as needed for sleep. 30 tablet 0   No current facility-administered medications for this visit.     Allergies:   Sulfa antibiotics    ROS:  Please see the history of present illness.   Otherwise, review of systems are positive for none.   All other systems are reviewed and negative.    PHYSICAL EXAM: VS:  BP (!) 154/62   Pulse 70   Ht 5\' 3"  (1.6 m)   Wt 149 lb (67.6 kg)   BMI 26.39 kg/m  , BMI Body mass index is 26.39 kg/m. GENERAL:  Well appearing, but somewhat frail NECK:  No jugular venous distention, waveform within normal limits, carotid upstroke brisk and symmetric, bilateral bruits, no thyromegaly LUNGS:  Clear to auscultation bilaterally CHEST:  Unremarkable HEART:  PMI not displaced or sustained,S1 and S2 within normal  limits, no S3, no S4, no clicks, no rubs, no murmurs ABD:  Flat, positive bowel sounds normal in frequency in pitch, no bruits, no rebound, no guarding, no midline pulsatile mass, no hepatomegaly, no splenomegaly EXT:  2 plus pulses throughout, no edema, no cyanosis no clubbing, right leg swelling with healing anterior tibial wound.     EKG:  EKG is  not not ordered today.    Recent Labs: 09/22/2016: B Natriuretic Peptide 201.8; BUN 20; Creatinine, Ser 0.89; Hemoglobin 10.0; Platelets 198; Potassium 4.4; Sodium 138    Lipid Panel No results found for: CHOL, TRIG, HDL, CHOLHDL, VLDL, LDLCALC, LDLDIRECT    Wt Readings from Last 3 Encounters:  02/10/17 149 lb (67.6 kg)  10/13/16 153 lb 12.8 oz (69.8 kg)  09/23/16 149 lb 14.4 oz (68 kg)      Other studies Reviewed: Additional studies/ records that were reviewed today include: None Review of the above records demonstrates:      ASSESSMENT AND PLAN:  SYNCOPE:  She has had this was likely secondary to combination of medications that she's no longer taking. No further workup is planned.  CAD:   The patient has no new sypmtoms.  No further cardiovascular testing is indicated.  We will continue with aggressive risk reduction and meds as listed.   BRUIT:  She had mild plaque on Doppler in June.  I will follow again in one year.     HTN:  Her blood pressure is OK.  No change in therapy.    DM:  I will defer to Mathews Argyle, MD  .  She remain on the meds as listed.   ATRIAL FIB:  [name] has a [id number] - VASc score of 7.  However, we discussed this today and her daughter reports that she falls multiple times a year and so we agree that she is not an anticoagulation candidate.   DYSLIPIDEMIA:  Given her significant vascular history I will suggest a goal LDL less than 70 and I will again defer to Mathews Argyle, MD   Current medicines are reviewed at length with the patient today.  The patient does not have  concerns regarding medicines.  The following changes have been made:  None  Labs/ tests ordered today include:  None  No orders of the defined types were placed in this encounter.    Disposition:   FU with me in 6 months.    Signed, Minus Breeding, MD  02/11/2017 12:24 PM  [facility]

## 2017-02-10 ENCOUNTER — Encounter: Payer: Self-pay | Admitting: Cardiology

## 2017-02-10 ENCOUNTER — Ambulatory Visit (INDEPENDENT_AMBULATORY_CARE_PROVIDER_SITE_OTHER): Payer: Medicare Other | Admitting: Cardiology

## 2017-02-10 VITALS — BP 154/62 | HR 70 | Ht 63.0 in | Wt 149.0 lb

## 2017-02-10 DIAGNOSIS — R55 Syncope and collapse: Secondary | ICD-10-CM | POA: Diagnosis not present

## 2017-02-10 DIAGNOSIS — I6523 Occlusion and stenosis of bilateral carotid arteries: Secondary | ICD-10-CM

## 2017-02-10 DIAGNOSIS — E119 Type 2 diabetes mellitus without complications: Secondary | ICD-10-CM | POA: Diagnosis not present

## 2017-02-10 DIAGNOSIS — I482 Chronic atrial fibrillation: Secondary | ICD-10-CM

## 2017-02-10 DIAGNOSIS — I4821 Permanent atrial fibrillation: Secondary | ICD-10-CM

## 2017-02-10 NOTE — Patient Instructions (Signed)

## 2017-02-11 ENCOUNTER — Encounter: Payer: Self-pay | Admitting: Cardiology

## 2017-02-12 DIAGNOSIS — R2681 Unsteadiness on feet: Secondary | ICD-10-CM | POA: Diagnosis not present

## 2017-02-12 DIAGNOSIS — R278 Other lack of coordination: Secondary | ICD-10-CM | POA: Diagnosis not present

## 2017-02-12 DIAGNOSIS — M6281 Muscle weakness (generalized): Secondary | ICD-10-CM | POA: Diagnosis not present

## 2017-02-12 DIAGNOSIS — Z9181 History of falling: Secondary | ICD-10-CM | POA: Diagnosis not present

## 2017-02-13 DIAGNOSIS — M6281 Muscle weakness (generalized): Secondary | ICD-10-CM | POA: Diagnosis not present

## 2017-02-13 DIAGNOSIS — Z9181 History of falling: Secondary | ICD-10-CM | POA: Diagnosis not present

## 2017-02-13 DIAGNOSIS — R2681 Unsteadiness on feet: Secondary | ICD-10-CM | POA: Diagnosis not present

## 2017-02-13 DIAGNOSIS — R278 Other lack of coordination: Secondary | ICD-10-CM | POA: Diagnosis not present

## 2017-02-17 DIAGNOSIS — R2681 Unsteadiness on feet: Secondary | ICD-10-CM | POA: Diagnosis not present

## 2017-02-17 DIAGNOSIS — R278 Other lack of coordination: Secondary | ICD-10-CM | POA: Diagnosis not present

## 2017-02-17 DIAGNOSIS — Z9181 History of falling: Secondary | ICD-10-CM | POA: Diagnosis not present

## 2017-02-17 DIAGNOSIS — M6281 Muscle weakness (generalized): Secondary | ICD-10-CM | POA: Diagnosis not present

## 2017-02-18 DIAGNOSIS — M25561 Pain in right knee: Secondary | ICD-10-CM | POA: Diagnosis not present

## 2017-02-18 DIAGNOSIS — Z9181 History of falling: Secondary | ICD-10-CM | POA: Diagnosis not present

## 2017-02-18 DIAGNOSIS — M6281 Muscle weakness (generalized): Secondary | ICD-10-CM | POA: Diagnosis not present

## 2017-02-18 DIAGNOSIS — M25562 Pain in left knee: Secondary | ICD-10-CM | POA: Diagnosis not present

## 2017-02-18 DIAGNOSIS — R2681 Unsteadiness on feet: Secondary | ICD-10-CM | POA: Diagnosis not present

## 2017-02-18 DIAGNOSIS — M67911 Unspecified disorder of synovium and tendon, right shoulder: Secondary | ICD-10-CM | POA: Diagnosis not present

## 2017-02-18 DIAGNOSIS — R278 Other lack of coordination: Secondary | ICD-10-CM | POA: Diagnosis not present

## 2017-02-19 DIAGNOSIS — R278 Other lack of coordination: Secondary | ICD-10-CM | POA: Diagnosis not present

## 2017-02-19 DIAGNOSIS — M6281 Muscle weakness (generalized): Secondary | ICD-10-CM | POA: Diagnosis not present

## 2017-02-19 DIAGNOSIS — R2681 Unsteadiness on feet: Secondary | ICD-10-CM | POA: Diagnosis not present

## 2017-02-19 DIAGNOSIS — Z9181 History of falling: Secondary | ICD-10-CM | POA: Diagnosis not present

## 2017-02-20 DIAGNOSIS — M6281 Muscle weakness (generalized): Secondary | ICD-10-CM | POA: Diagnosis not present

## 2017-02-20 DIAGNOSIS — Z9181 History of falling: Secondary | ICD-10-CM | POA: Diagnosis not present

## 2017-02-20 DIAGNOSIS — R2681 Unsteadiness on feet: Secondary | ICD-10-CM | POA: Diagnosis not present

## 2017-02-20 DIAGNOSIS — R278 Other lack of coordination: Secondary | ICD-10-CM | POA: Diagnosis not present

## 2017-02-24 DIAGNOSIS — M6281 Muscle weakness (generalized): Secondary | ICD-10-CM | POA: Diagnosis not present

## 2017-02-24 DIAGNOSIS — R278 Other lack of coordination: Secondary | ICD-10-CM | POA: Diagnosis not present

## 2017-02-24 DIAGNOSIS — Z9181 History of falling: Secondary | ICD-10-CM | POA: Diagnosis not present

## 2017-02-24 DIAGNOSIS — R2681 Unsteadiness on feet: Secondary | ICD-10-CM | POA: Diagnosis not present

## 2017-02-25 DIAGNOSIS — M6281 Muscle weakness (generalized): Secondary | ICD-10-CM | POA: Diagnosis not present

## 2017-02-25 DIAGNOSIS — R2681 Unsteadiness on feet: Secondary | ICD-10-CM | POA: Diagnosis not present

## 2017-02-25 DIAGNOSIS — Z9181 History of falling: Secondary | ICD-10-CM | POA: Diagnosis not present

## 2017-02-25 DIAGNOSIS — R278 Other lack of coordination: Secondary | ICD-10-CM | POA: Diagnosis not present

## 2017-02-26 DIAGNOSIS — M6281 Muscle weakness (generalized): Secondary | ICD-10-CM | POA: Diagnosis not present

## 2017-02-26 DIAGNOSIS — Z9181 History of falling: Secondary | ICD-10-CM | POA: Diagnosis not present

## 2017-02-26 DIAGNOSIS — R2681 Unsteadiness on feet: Secondary | ICD-10-CM | POA: Diagnosis not present

## 2017-02-26 DIAGNOSIS — R278 Other lack of coordination: Secondary | ICD-10-CM | POA: Diagnosis not present

## 2017-02-27 DIAGNOSIS — M6281 Muscle weakness (generalized): Secondary | ICD-10-CM | POA: Diagnosis not present

## 2017-02-27 DIAGNOSIS — Z9181 History of falling: Secondary | ICD-10-CM | POA: Diagnosis not present

## 2017-02-27 DIAGNOSIS — R2681 Unsteadiness on feet: Secondary | ICD-10-CM | POA: Diagnosis not present

## 2017-02-27 DIAGNOSIS — R278 Other lack of coordination: Secondary | ICD-10-CM | POA: Diagnosis not present

## 2017-03-02 DIAGNOSIS — Z9181 History of falling: Secondary | ICD-10-CM | POA: Diagnosis not present

## 2017-03-02 DIAGNOSIS — R2681 Unsteadiness on feet: Secondary | ICD-10-CM | POA: Diagnosis not present

## 2017-03-03 DIAGNOSIS — R2681 Unsteadiness on feet: Secondary | ICD-10-CM | POA: Diagnosis not present

## 2017-03-03 DIAGNOSIS — Z9181 History of falling: Secondary | ICD-10-CM | POA: Diagnosis not present

## 2017-03-05 DIAGNOSIS — Z9181 History of falling: Secondary | ICD-10-CM | POA: Diagnosis not present

## 2017-03-05 DIAGNOSIS — R2681 Unsteadiness on feet: Secondary | ICD-10-CM | POA: Diagnosis not present

## 2017-03-11 DIAGNOSIS — Z9181 History of falling: Secondary | ICD-10-CM | POA: Diagnosis not present

## 2017-03-11 DIAGNOSIS — R2681 Unsteadiness on feet: Secondary | ICD-10-CM | POA: Diagnosis not present

## 2017-03-12 DIAGNOSIS — R2681 Unsteadiness on feet: Secondary | ICD-10-CM | POA: Diagnosis not present

## 2017-03-12 DIAGNOSIS — Z9181 History of falling: Secondary | ICD-10-CM | POA: Diagnosis not present

## 2017-03-13 DIAGNOSIS — R2681 Unsteadiness on feet: Secondary | ICD-10-CM | POA: Diagnosis not present

## 2017-03-13 DIAGNOSIS — Z9181 History of falling: Secondary | ICD-10-CM | POA: Diagnosis not present

## 2017-03-16 DIAGNOSIS — Z9181 History of falling: Secondary | ICD-10-CM | POA: Diagnosis not present

## 2017-03-16 DIAGNOSIS — R2681 Unsteadiness on feet: Secondary | ICD-10-CM | POA: Diagnosis not present

## 2017-03-18 DIAGNOSIS — M19011 Primary osteoarthritis, right shoulder: Secondary | ICD-10-CM | POA: Diagnosis not present

## 2017-03-23 DIAGNOSIS — R2681 Unsteadiness on feet: Secondary | ICD-10-CM | POA: Diagnosis not present

## 2017-03-23 DIAGNOSIS — Z9181 History of falling: Secondary | ICD-10-CM | POA: Diagnosis not present

## 2017-03-25 DIAGNOSIS — Z9181 History of falling: Secondary | ICD-10-CM | POA: Diagnosis not present

## 2017-03-25 DIAGNOSIS — R2681 Unsteadiness on feet: Secondary | ICD-10-CM | POA: Diagnosis not present

## 2017-03-27 DIAGNOSIS — R2681 Unsteadiness on feet: Secondary | ICD-10-CM | POA: Diagnosis not present

## 2017-03-27 DIAGNOSIS — Z9181 History of falling: Secondary | ICD-10-CM | POA: Diagnosis not present

## 2017-03-31 DIAGNOSIS — R2681 Unsteadiness on feet: Secondary | ICD-10-CM | POA: Diagnosis not present

## 2017-03-31 DIAGNOSIS — Z9181 History of falling: Secondary | ICD-10-CM | POA: Diagnosis not present

## 2017-04-01 DIAGNOSIS — Z9181 History of falling: Secondary | ICD-10-CM | POA: Diagnosis not present

## 2017-04-01 DIAGNOSIS — R2681 Unsteadiness on feet: Secondary | ICD-10-CM | POA: Diagnosis not present

## 2017-04-03 DIAGNOSIS — Z9181 History of falling: Secondary | ICD-10-CM | POA: Diagnosis not present

## 2017-04-03 DIAGNOSIS — R2681 Unsteadiness on feet: Secondary | ICD-10-CM | POA: Diagnosis not present

## 2017-04-17 DIAGNOSIS — E1140 Type 2 diabetes mellitus with diabetic neuropathy, unspecified: Secondary | ICD-10-CM | POA: Diagnosis not present

## 2017-04-17 DIAGNOSIS — I129 Hypertensive chronic kidney disease with stage 1 through stage 4 chronic kidney disease, or unspecified chronic kidney disease: Secondary | ICD-10-CM | POA: Diagnosis not present

## 2017-04-17 DIAGNOSIS — E1121 Type 2 diabetes mellitus with diabetic nephropathy: Secondary | ICD-10-CM | POA: Diagnosis not present

## 2017-04-17 DIAGNOSIS — R251 Tremor, unspecified: Secondary | ICD-10-CM | POA: Diagnosis not present

## 2017-04-17 DIAGNOSIS — F325 Major depressive disorder, single episode, in full remission: Secondary | ICD-10-CM | POA: Diagnosis not present

## 2017-04-17 DIAGNOSIS — N183 Chronic kidney disease, stage 3 (moderate): Secondary | ICD-10-CM | POA: Diagnosis not present

## 2017-04-17 DIAGNOSIS — Z79899 Other long term (current) drug therapy: Secondary | ICD-10-CM | POA: Diagnosis not present

## 2017-06-05 ENCOUNTER — Encounter (HOSPITAL_COMMUNITY): Payer: Self-pay

## 2017-06-05 ENCOUNTER — Emergency Department (HOSPITAL_COMMUNITY): Payer: Medicare Other

## 2017-06-05 ENCOUNTER — Emergency Department (HOSPITAL_COMMUNITY)
Admission: EM | Admit: 2017-06-05 | Discharge: 2017-06-05 | Disposition: A | Discharge status: Home / Self Care | Payer: Medicare Other | Attending: Emergency Medicine | Admitting: Emergency Medicine

## 2017-06-05 DIAGNOSIS — S0003XA Contusion of scalp, initial encounter: Secondary | ICD-10-CM | POA: Diagnosis not present

## 2017-06-05 DIAGNOSIS — I2510 Atherosclerotic heart disease of native coronary artery without angina pectoris: Secondary | ICD-10-CM | POA: Insufficient documentation

## 2017-06-05 DIAGNOSIS — Y939 Activity, unspecified: Secondary | ICD-10-CM | POA: Diagnosis not present

## 2017-06-05 DIAGNOSIS — Y999 Unspecified external cause status: Secondary | ICD-10-CM | POA: Insufficient documentation

## 2017-06-05 DIAGNOSIS — W0110XA Fall on same level from slipping, tripping and stumbling with subsequent striking against unspecified object, initial encounter: Secondary | ICD-10-CM | POA: Insufficient documentation

## 2017-06-05 DIAGNOSIS — Z7984 Long term (current) use of oral hypoglycemic drugs: Secondary | ICD-10-CM | POA: Diagnosis not present

## 2017-06-05 DIAGNOSIS — Z951 Presence of aortocoronary bypass graft: Secondary | ICD-10-CM | POA: Diagnosis not present

## 2017-06-05 DIAGNOSIS — I110 Hypertensive heart disease with heart failure: Secondary | ICD-10-CM | POA: Diagnosis not present

## 2017-06-05 DIAGNOSIS — S064X1A Epidural hemorrhage with loss of consciousness of 30 minutes or less, initial encounter: Secondary | ICD-10-CM | POA: Diagnosis not present

## 2017-06-05 DIAGNOSIS — Y929 Unspecified place or not applicable: Secondary | ICD-10-CM | POA: Insufficient documentation

## 2017-06-05 DIAGNOSIS — S0990XA Unspecified injury of head, initial encounter: Secondary | ICD-10-CM

## 2017-06-05 DIAGNOSIS — Z7982 Long term (current) use of aspirin: Secondary | ICD-10-CM | POA: Insufficient documentation

## 2017-06-05 DIAGNOSIS — Z955 Presence of coronary angioplasty implant and graft: Secondary | ICD-10-CM | POA: Insufficient documentation

## 2017-06-05 DIAGNOSIS — S199XXA Unspecified injury of neck, initial encounter: Secondary | ICD-10-CM | POA: Diagnosis not present

## 2017-06-05 DIAGNOSIS — S098XXA Other specified injuries of head, initial encounter: Secondary | ICD-10-CM | POA: Diagnosis not present

## 2017-06-05 DIAGNOSIS — I5022 Chronic systolic (congestive) heart failure: Secondary | ICD-10-CM | POA: Diagnosis not present

## 2017-06-05 DIAGNOSIS — Z79899 Other long term (current) drug therapy: Secondary | ICD-10-CM | POA: Diagnosis not present

## 2017-06-05 DIAGNOSIS — E1140 Type 2 diabetes mellitus with diabetic neuropathy, unspecified: Secondary | ICD-10-CM | POA: Diagnosis not present

## 2017-06-05 DIAGNOSIS — W19XXXA Unspecified fall, initial encounter: Secondary | ICD-10-CM

## 2017-06-05 NOTE — ED Triage Notes (Signed)
Pt BIB [facility] from [facility] for an unwitnessed fall. She was reaching for a book, leaned forward, and hit her head on a shelf. Hematoma noticed to L side of forehead and central forehead. Pt remembers falling. Denies LOC. No blood thinners. A&Ox4. C-spine cleared with EMS.

## 2017-06-05 NOTE — ED Provider Notes (Signed)
[facility] DEPT Provider Note   CSN: [id number] Arrival date & time: 06/05/17  1317     History   Chief Complaint Chief Complaint  Patient presents with  . Fall  . Head Injury    HPI Jessica King is a 83 y.o. female.  HPI   Pt with hx HTN, DM, CAD p/w head injury.  States she was leaning forward to pick a book up off the floor and fell forward, hitting her head.  She was awake the entire time, knows she struck her left forehead on the floor, is unsure of central forehead bruise.  No break in skin.  Denies any pain.  Has chronic neck and right arm pain that are unchanged.  Denies vision changes, weakness or numbness of the extremities.    Past Medical History:  Diagnosis Date  . Abdominal fibromatosis   . Allergic rhinitis   . CAD (coronary artery disease)   . Chronic systolic congestive heart failure ([place])   . Coronary artery disease    Cardiac cath 1999, 2007, 2010, Taxus stent x 2 to LAD 2007.   . Diabetes mellitus without complication ([place])   . Diverticulosis   . Hyperlipidemia   . Hypertension   . Iron deficiency anemia   . Peripheral neuropathy    Frequent fall  . PVD (peripheral vascular disease) ([place])    Subclavian stent May 2013 (subclavian steal)    Patient Active Problem List   Diagnosis Date Noted  . Syncope 09/22/2016  . Fall 09/22/2016  . Hyperkalemia 09/22/2016  . Atrial fibrillation ([place]) 09/22/2016  . Diabetes mellitus without complication ([place]) 63/84/5364  . Chronic systolic congestive heart failure ([place])   . Abdominal fibromatosis   . Hyperlipidemia   . Hypertension   . Essential hypertension   . Laceration of scalp without foreign body   . Syncope and collapse   . Dizziness 05/02/2015  . CAD (coronary artery disease) 05/02/2015  . PVD (peripheral vascular disease) ([place]) 05/02/2015    Past Surgical History:  Procedure Laterality Date  . BACK SURGERY     x 3 or 4 lumbar, cervical x 2  . CEA    . CORONARY ARTERY BYPASS GRAFT  2011     x 3   . Coronary Stents x3      OB History    No data available       Home Medications    Prior to Admission medications   Medication Sig Start Date End Date Taking? Authorizing Provider  aspirin EC 81 MG tablet Take 1 tablet (81 mg total) by mouth daily. 10/13/16   Minus Breeding, MD  Ca Carbonate-Mag Hydroxide (ROLAIDS) 550-110 MG CHEW Chew 1 tablet by mouth as needed (for heart burn).    [provider]  clonazePAM (KLONOPIN) 0.5 MG tablet Take 0.5 mg by mouth 2 (two) times daily. Takes 1 tab in daytime as needed and 1 tab every bedtime    [provider]  CRANBERRY PO Take 4,200 mg by mouth daily.    [provider]  gabapentin (NEURONTIN) 300 MG capsule Take 300-600 mg by mouth See admin instructions. Take 1 capsule at 8 am and 12 pm and then take 2 capsules at bedtime    [provider]  HYDROcodone-acetaminophen (NORCO) 7.5-325 MG per tablet Take 1 tablet by mouth every 6 (six) hours as needed for moderate pain.     [provider]  lisinopril (PRINIVIL,ZESTRIL) 5 MG tablet Take 1 tablet (5 mg total) by mouth  daily. 09/23/16   Mikhail, Velta Addison, DO  loratadine (CLARITIN) 10 MG tablet Take 10 mg by mouth daily as needed for allergies.     [provider]  metFORMIN (GLUCOPHAGE) 1000 MG tablet Take 1,000 mg by mouth 2 (two) times daily with a meal.    [provider]  methocarbamol (ROBAXIN) 750 MG tablet Take 750 mg by mouth every 6 (six) hours as needed for muscle spasms.     [provider]  Multiple Vitamin (MULTIVITAMIN WITH MINERALS) TABS tablet Take 1 tablet by mouth daily.    [provider]  PARoxetine (PAXIL) 20 MG tablet Take 20 mg by mouth daily.    [provider]  simvastatin (ZOCOR) 40 MG tablet Take 40 mg by mouth daily.    [provider]  zolpidem (AMBIEN) 5 MG tablet Take 1 tablet (5 mg total) by mouth at bedtime as needed for sleep. 09/23/16   Cristal Ford, DO     Family History Family History  Problem Relation Age of Onset  . Hypertension Mother   . Heart disease Mother        No details.  Died at age 76  . Diabetes Father   . Heart attack Brother 60    Social History Social History  Substance Use Topics  . Smoking status: Never Smoker  . Smokeless tobacco: Never Used  . Alcohol use No     Allergies   Sulfa antibiotics   Review of Systems Review of Systems  All other systems reviewed and are negative.    Physical Exam Updated Vital Signs BP (!) 135/56 (BP Location: Left Arm)   Pulse 61   Temp 97.8 F (36.6 C) (Oral)   Resp 14   SpO2 99%   Physical Exam  Constitutional: She appears well-developed and well-nourished. No distress.  HENT:  Head: Normocephalic.    Eyes: Conjunctivae are normal.  Neck: Normal range of motion. Neck supple.  Cardiovascular: Normal rate.   Pulmonary/Chest: Effort normal. She exhibits no tenderness.  Abdominal: Soft. She exhibits no distension and no mass. There is no tenderness. There is no rebound and no guarding.  Musculoskeletal: Normal range of motion. She exhibits no tenderness.  Spine nontender, no crepitus, or stepoffs.   Neurological: She is alert. She exhibits normal muscle tone.  Skin: She is not diaphoretic.  Psychiatric: She has a normal mood and affect. Her behavior is normal.  Nursing note and vitals reviewed.    ED Treatments / Results  Labs (all labs ordered are listed, but only abnormal results are displayed) Labs Reviewed - No data to display  EKG  EKG Interpretation None       Radiology Ct Head Wo Contrast  Result Date: 06/05/2017 CLINICAL DATA:  Unwitnessed fall at [facility] green today, was reaching for a bull, lean forward and struck her head on shelf, LEFT frontal hematoma, no loss of consciousness, history hypertension, diabetes mellitus, coronary artery disease EXAM: CT HEAD WITHOUT CONTRAST CT CERVICAL SPINE WITHOUT CONTRAST TECHNIQUE: Multidetector  CT imaging of the head and cervical spine was performed following the standard protocol without intravenous contrast. Multiplanar CT image reconstructions of the cervical spine were also generated. COMPARISON:  09/22/2016 FINDINGS: CT HEAD FINDINGS Brain: Generalized atrophy. Normal ventricular morphology. No midline shift or mass effect. Beam hardening artifacts from skullbase. No intracranial hemorrhage, mass lesion, or evidence acute infarction. No extra-axial fluid collections. Vascular: Atherosclerotic calcifications of the internal carotid arteries at the skullbase Skull: Intact.  Small LEFT frontal scalp  hematoma. Sinuses/Orbits: Clear Other: N/A CT CERVICAL SPINE FINDINGS Alignment: Normal Skull base and vertebrae: Visualized skullbase intact. Cannulated screw in C2 into the odontoid process post ORIF. Pedicle screws and posterior bars C5-T2. Anterior plate and screws Y1-P5 with a bone strut at C5-C7. No acute fracture or bone destruction. Soft tissues and spinal canal: Prevertebral soft tissues normal thickness. Regional soft tissues unremarkable. Disc levels: Multilevel disc space narrowing. Uncovertebral spurs at C3-C4 bilaterally impinging upon the RIGHT neural foramen in combination with RIGHT facet hypertrophy. Remaining foramina appear mildly narrowed diffusely. Upper chest: Lung apices clear Other: Atherosclerotic calcifications at the LEFT carotid bifurcation. IMPRESSION: No acute intracranial abnormalities. Scattered degenerative changes and extensive postsurgical changes of the cervical spine, stable. No acute intracranial abnormalities. Electronically Signed   By: Lavonia Dana M.D.   On: 06/05/2017 15:04   Ct Cervical Spine Wo Contrast  Result Date: 06/05/2017 CLINICAL DATA:  Unwitnessed fall at [facility] green today, was reaching for a bull, lean forward and struck her head on shelf, LEFT frontal hematoma, no loss of consciousness, history hypertension, diabetes mellitus, coronary artery  disease EXAM: CT HEAD WITHOUT CONTRAST CT CERVICAL SPINE WITHOUT CONTRAST TECHNIQUE: Multidetector CT imaging of the head and cervical spine was performed following the standard protocol without intravenous contrast. Multiplanar CT image reconstructions of the cervical spine were also generated. COMPARISON:  09/22/2016 FINDINGS: CT HEAD FINDINGS Brain: Generalized atrophy. Normal ventricular morphology. No midline shift or mass effect. Beam hardening artifacts from skullbase. No intracranial hemorrhage, mass lesion, or evidence acute infarction. No extra-axial fluid collections. Vascular: Atherosclerotic calcifications of the internal carotid arteries at the skullbase Skull: Intact.  Small LEFT frontal scalp hematoma. Sinuses/Orbits: Clear Other: N/A CT CERVICAL SPINE FINDINGS Alignment: Normal Skull base and vertebrae: Visualized skullbase intact. Cannulated screw in C2 into the odontoid process post ORIF. Pedicle screws and posterior bars C5-T2. Anterior plate and screws K9-T2 with a bone strut at C5-C7. No acute fracture or bone destruction. Soft tissues and spinal canal: Prevertebral soft tissues normal thickness. Regional soft tissues unremarkable. Disc levels: Multilevel disc space narrowing. Uncovertebral spurs at C3-C4 bilaterally impinging upon the RIGHT neural foramen in combination with RIGHT facet hypertrophy. Remaining foramina appear mildly narrowed diffusely. Upper chest: Lung apices clear Other: Atherosclerotic calcifications at the LEFT carotid bifurcation. IMPRESSION: No acute intracranial abnormalities. Scattered degenerative changes and extensive postsurgical changes of the cervical spine, stable. No acute intracranial abnormalities. Electronically Signed   By: Lavonia Dana M.D.   On: 06/05/2017 15:04    Procedures Procedures (including critical care time)  Medications Ordered in ED Medications - No data to display   Initial Impression / Assessment and Plan / ED Course  I have reviewed  the triage vital signs and the nursing notes.  Pertinent labs & imaging results that were available during my care of the patient were reviewed by me and considered in my medical decision making (see chart for details).     Afebrile, nontoxic patient with accidental fall forward.  No LOC, no systemic symptoms.  Pt fell because she lost her balance while overreaching.  CTs negative.   D/C home with PCP follow up.  Discussed result, findings, treatment, and follow up  with patient.  Pt given return precautions.  Pt verbalizes understanding and agrees with plan.       Final Clinical Impressions(s) / ED Diagnoses   Final diagnoses:  Injury of head, initial encounter  Fall, initial encounter    New Prescriptions Discharge Medication List as of 06/05/2017  3:15 PM       [place] Bibles, Hershal Coria 06/05/17 1617    Davonna Belling, MD 06/06/17 [phone]

## 2017-06-05 NOTE — Discharge Instructions (Signed)
Read the information below.  You may return to the Emergency Department at any time for worsening condition or any new symptoms that concern you.   You have had a head injury which does not appear to require admission at this time. A concussion is a state of changed mental ability from trauma. SEEK IMMEDIATE MEDICAL ATTENTION IF: There is confusion or drowsiness (although children frequently become drowsy after injury).  You cannot awaken the injured person.  There is nausea (feeling sick to your stomach) or continued, forceful vomiting.  You notice dizziness or unsteadiness which is getting worse, or inability to walk.  You have convulsions or unconsciousness.  You experience severe, persistent headaches not relieved by Tylenol?. (Do not take aspirin as this impairs clotting abilities). Take other pain medications only as directed.  You cannot use arms or legs normally.  There are changes in pupil sizes. (This is the black center in the colored part of the eye)  There is clear or bloody discharge from the nose or ears.  Change in speech, vision, swallowing, or understanding.  Localized weakness, numbness, tingling, or change in bowel or bladder control.

## 2017-06-05 NOTE — ED Notes (Signed)
Bed: [facility] Expected date:  Expected time:  Means of arrival:  Comments: EMS-fall-head hematoma

## 2017-07-02 DIAGNOSIS — H524 Presbyopia: Secondary | ICD-10-CM | POA: Diagnosis not present

## 2017-07-02 DIAGNOSIS — E119 Type 2 diabetes mellitus without complications: Secondary | ICD-10-CM | POA: Diagnosis not present

## 2017-07-13 ENCOUNTER — Emergency Department (HOSPITAL_COMMUNITY)
Admission: EM | Admit: 2017-07-13 | Discharge: 2017-07-13 | Disposition: A | Discharge status: Home / Self Care | Payer: Medicare Other | Attending: Emergency Medicine | Admitting: Emergency Medicine

## 2017-07-13 ENCOUNTER — Emergency Department (HOSPITAL_COMMUNITY): Payer: Medicare Other

## 2017-07-13 DIAGNOSIS — W19XXXA Unspecified fall, initial encounter: Secondary | ICD-10-CM

## 2017-07-13 DIAGNOSIS — Z7982 Long term (current) use of aspirin: Secondary | ICD-10-CM | POA: Diagnosis not present

## 2017-07-13 DIAGNOSIS — S0990XA Unspecified injury of head, initial encounter: Secondary | ICD-10-CM | POA: Diagnosis not present

## 2017-07-13 DIAGNOSIS — Y999 Unspecified external cause status: Secondary | ICD-10-CM | POA: Insufficient documentation

## 2017-07-13 DIAGNOSIS — S0101XA Laceration without foreign body of scalp, initial encounter: Secondary | ICD-10-CM | POA: Insufficient documentation

## 2017-07-13 DIAGNOSIS — Y92009 Unspecified place in unspecified non-institutional (private) residence as the place of occurrence of the external cause: Secondary | ICD-10-CM

## 2017-07-13 DIAGNOSIS — W1830XA Fall on same level, unspecified, initial encounter: Secondary | ICD-10-CM | POA: Diagnosis not present

## 2017-07-13 DIAGNOSIS — Y92013 Bedroom of single-family (private) house as the place of occurrence of the external cause: Secondary | ICD-10-CM | POA: Diagnosis not present

## 2017-07-13 DIAGNOSIS — Z951 Presence of aortocoronary bypass graft: Secondary | ICD-10-CM | POA: Insufficient documentation

## 2017-07-13 DIAGNOSIS — I5022 Chronic systolic (congestive) heart failure: Secondary | ICD-10-CM | POA: Diagnosis not present

## 2017-07-13 DIAGNOSIS — I2510 Atherosclerotic heart disease of native coronary artery without angina pectoris: Secondary | ICD-10-CM | POA: Insufficient documentation

## 2017-07-13 DIAGNOSIS — Y939 Activity, unspecified: Secondary | ICD-10-CM | POA: Diagnosis not present

## 2017-07-13 DIAGNOSIS — Z23 Encounter for immunization: Secondary | ICD-10-CM | POA: Diagnosis not present

## 2017-07-13 DIAGNOSIS — Z7984 Long term (current) use of oral hypoglycemic drugs: Secondary | ICD-10-CM | POA: Insufficient documentation

## 2017-07-13 DIAGNOSIS — I110 Hypertensive heart disease with heart failure: Secondary | ICD-10-CM | POA: Insufficient documentation

## 2017-07-13 DIAGNOSIS — Z79891 Long term (current) use of opiate analgesic: Secondary | ICD-10-CM | POA: Insufficient documentation

## 2017-07-13 DIAGNOSIS — E119 Type 2 diabetes mellitus without complications: Secondary | ICD-10-CM | POA: Diagnosis not present

## 2017-07-13 DIAGNOSIS — S098XXA Other specified injuries of head, initial encounter: Secondary | ICD-10-CM | POA: Diagnosis not present

## 2017-07-13 DIAGNOSIS — S0181XA Laceration without foreign body of other part of head, initial encounter: Secondary | ICD-10-CM | POA: Diagnosis not present

## 2017-07-13 MED ORDER — TETANUS-DIPHTH-ACELL PERTUSSIS 5-2.5-18.5 LF-MCG/0.5 IM SUSP
0.5000 mL | Freq: Once | INTRAMUSCULAR | Status: AC
  Administered 2017-07-13: 0.5 mL via INTRAMUSCULAR
  Filled 2017-07-13: qty 0.5

## 2017-07-13 NOTE — Discharge Instructions (Signed)
[facility] out in about 10 days

## 2017-07-13 NOTE — ED Provider Notes (Signed)
[facility] DEPT Provider Note   CSN: [id number] Arrival date & time: 07/13/17  1740     History   Chief Complaint Chief Complaint  Patient presents with  . Fall    HPI Jessica King is a 83 y.o. female.  HPI Patient presents after a fall. Bending over to get close and lost her balance. Laceration to back of head. No loss conscious. On only baby aspirin for anticoagulation. Previous history of falls. States she landed onto her left side. Slight pain in her left shoulder which is not unusual for her. Has been ambulatory since. No numbness weakness. No neck pain. No confusion. Unknown last tetanus. No chest or abdominal pain. Now is starting to get a frontal headache after the fall. Past Medical History:  Diagnosis Date  . Abdominal fibromatosis   . Allergic rhinitis   . CAD (coronary artery disease)   . Chronic systolic congestive heart failure ([place])   . Coronary artery disease    Cardiac cath 1999, 2007, 2010, Taxus stent x 2 to LAD 2007.   . Diabetes mellitus without complication ([place])   . Diverticulosis   . Hyperlipidemia   . Hypertension   . Iron deficiency anemia   . Peripheral neuropathy    Frequent fall  . PVD (peripheral vascular disease) ([place])    Subclavian stent May 2013 (subclavian steal)    Patient Active Problem List   Diagnosis Date Noted  . Syncope 09/22/2016  . Fall 09/22/2016  . Hyperkalemia 09/22/2016  . Atrial fibrillation ([place]) 09/22/2016  . Diabetes mellitus without complication ([place]) 81/44/8185  . Chronic systolic congestive heart failure ([place])   . Abdominal fibromatosis   . Hyperlipidemia   . Hypertension   . Essential hypertension   . Laceration of scalp without foreign body   . Syncope and collapse   . Dizziness 05/02/2015  . CAD (coronary artery disease) 05/02/2015  . PVD (peripheral vascular disease) ([place]) 05/02/2015    Past Surgical History:  Procedure Laterality Date  . BACK SURGERY     x 3 or 4 lumbar, cervical x 2  . CEA     . CORONARY ARTERY BYPASS GRAFT  2011   x 3   . Coronary Stents x3      OB History    No data available       Home Medications    Prior to Admission medications   Medication Sig Start Date End Date Taking? Authorizing Provider  aspirin EC 81 MG tablet Take 1 tablet (81 mg total) by mouth daily. 10/13/16   Minus Breeding, MD  Ca Carbonate-Mag Hydroxide (ROLAIDS) 550-110 MG CHEW Chew 1 tablet by mouth as needed (for heart burn).    [provider]  clonazePAM (KLONOPIN) 0.5 MG tablet Take 0.5 mg by mouth 2 (two) times daily. Takes 1 tab in daytime as needed and 1 tab every bedtime    [provider]  CRANBERRY PO Take 4,200 mg by mouth daily.    [provider]  gabapentin (NEURONTIN) 300 MG capsule Take 300-600 mg by mouth See admin instructions. Take 1 capsule at 8 am and 12 pm and then take 2 capsules at bedtime    [provider]  HYDROcodone-acetaminophen (NORCO) 7.5-325 MG per tablet Take 1 tablet by mouth every 6 (six) hours as needed for moderate pain.     [provider]  lisinopril (PRINIVIL,ZESTRIL) 5 MG tablet Take 1 tablet (5 mg total) by mouth daily. 09/23/16   Cristal Ford, DO  loratadine (  CLARITIN) 10 MG tablet Take 10 mg by mouth daily as needed for allergies.     [provider]  metFORMIN (GLUCOPHAGE) 1000 MG tablet Take 1,000 mg by mouth 2 (two) times daily with a meal.    [provider]  methocarbamol (ROBAXIN) 750 MG tablet Take 750 mg by mouth every 6 (six) hours as needed for muscle spasms.     [provider]  Multiple Vitamin (MULTIVITAMIN WITH MINERALS) TABS tablet Take 1 tablet by mouth daily.    [provider]  PARoxetine (PAXIL) 20 MG tablet Take 20 mg by mouth daily.    [provider]  simvastatin (ZOCOR) 40 MG tablet Take 40 mg by mouth daily.    [provider]  zolpidem (AMBIEN) 5 MG tablet Take 1 tablet (5 mg total) by mouth at bedtime as needed for  sleep. 09/23/16   Cristal Ford, DO    Family History Family History  Problem Relation Age of Onset  . Hypertension Mother   . Heart disease Mother        No details.  Died at age 93  . Diabetes Father   . Heart attack Brother 78    Social History Social History  Substance Use Topics  . Smoking status: Never Smoker  . Smokeless tobacco: Never Used  . Alcohol use No     Allergies   Sulfa antibiotics   Review of Systems Review of Systems  Constitutional: Negative for appetite change.  HENT: Negative for congestion.   Respiratory: Negative for shortness of breath.   Cardiovascular: Negative for chest pain.  Gastrointestinal: Negative for abdominal pain.  Genitourinary: Negative for hematuria.  Musculoskeletal: Negative for back pain and neck pain.  Skin: Positive for wound.  Neurological: Negative for weakness and numbness.     Physical Exam Updated Vital Signs BP 130/68 (BP Location: Right Arm)   Pulse 61   Resp 18   Physical Exam  Constitutional: She appears well-developed.  HENT:  Approximate 2 cm laceration on occipital area.  Eyes: Pupils are equal, round, and reactive to light.  Neck: Neck supple.  No midline cervical tenderness and painless range of motion.  Cardiovascular: Normal rate.   Pulmonary/Chest: Effort normal.  Musculoskeletal:  Good range of motion left shoulder. No crepitance. No deformity. No tenderness over left hip. Good range of motion left hip. No lumbar tenderness.  Neurological: She is alert.  Skin: Skin is warm. Capillary refill takes less than 2 seconds.  Psychiatric: She has a normal mood and affect.     ED Treatments / Results  Labs (all labs ordered are listed, but only abnormal results are displayed) Labs Reviewed - No data to display  EKG  EKG Interpretation None       Radiology Ct Head Wo Contrast  Result Date: 07/13/2017 CLINICAL DATA:  Status post fall. EXAM: CT HEAD WITHOUT CONTRAST TECHNIQUE:  Contiguous axial images were obtained from the base of the skull through the vertex without intravenous contrast. COMPARISON:  06/05/2017 FINDINGS: Brain: No evidence of acute infarction, hemorrhage, extra-axial collection, ventriculomegaly, or mass effect. Generalized cerebral atrophy. Periventricular white matter low attenuation likely secondary to microangiopathy. Vascular: Cerebrovascular atherosclerotic calcifications are noted. Skull: Negative for fracture or focal lesion. Sinuses/Orbits: Visualized portions of the orbits are unremarkable. Visualized portions of the paranasal sinuses and mastoid air cells are unremarkable. Other: None. IMPRESSION: No acute intracranial pathology. Electronically Signed   By: Kathreen Devoid   On: 07/13/2017 10:56    Procedures Procedures (  including critical care time)  Medications Ordered in ED Medications  Tdap (BOOSTRIX) injection 0.5 mL (0.5 mLs Intramuscular Given 07/13/17 1059)     Initial Impression / Assessment and Plan / ED Course  I have reviewed the triage vital signs and the nursing notes.  Pertinent labs & imaging results that were available during my care of the patient were reviewed by me and considered in my medical decision making (see chart for details).     Patient with scalp laceration. Fall. Negative head CT. No other apparent injury. Wound closed with staples. Discharge home.  LACERATION REPAIR Performed by: Mackie Pai Authorized by: Mackie Pai Consent: Verbal consent obtained. Risks and benefits: risks, benefits and alternatives were discussed Consent given by: patient Patient identity confirmed: provided demographic data Prepped and Draped in normal sterile fashion Wound explored  Laceration Location: scalp  Laceration Length: 2cm  No Foreign Bodies seen or palpated   Amount of cleaning: standard  Skin closure: staples  Number of staples:3  Patient tolerance: Patient tolerated the procedure well  with no immediate complications.   Final Clinical Impressions(s) / ED Diagnoses   Final diagnoses:  Fall in home, initial encounter  Laceration of scalp, initial encounter    New Prescriptions New Prescriptions   No medications on file     Davonna Belling, MD 07/13/17 1123

## 2017-07-13 NOTE — ED Notes (Signed)
Bed: WA21 Expected date:  Expected time:  Means of arrival:  Comments: Elderly fall- room 21

## 2017-07-13 NOTE — ED Triage Notes (Signed)
Per EMS: Pt from [facility].  Pt was bending down to get clothes out of her dresser and fell.  Pt has no pain but has a laceration to lt side of head (appx 1.5 inches in length).  Remembers fall.  Pt neurologically at baseline.

## 2017-07-20 DIAGNOSIS — S0101XD Laceration without foreign body of scalp, subsequent encounter: Secondary | ICD-10-CM | POA: Diagnosis not present

## 2017-07-20 DIAGNOSIS — I129 Hypertensive chronic kidney disease with stage 1 through stage 4 chronic kidney disease, or unspecified chronic kidney disease: Secondary | ICD-10-CM | POA: Diagnosis not present

## 2017-07-20 DIAGNOSIS — W19XXXD Unspecified fall, subsequent encounter: Secondary | ICD-10-CM | POA: Diagnosis not present

## 2017-07-20 DIAGNOSIS — I480 Paroxysmal atrial fibrillation: Secondary | ICD-10-CM | POA: Diagnosis not present

## 2017-07-28 DIAGNOSIS — R278 Other lack of coordination: Secondary | ICD-10-CM | POA: Diagnosis not present

## 2017-07-28 DIAGNOSIS — I2510 Atherosclerotic heart disease of native coronary artery without angina pectoris: Secondary | ICD-10-CM | POA: Diagnosis not present

## 2017-07-28 DIAGNOSIS — M810 Age-related osteoporosis without current pathological fracture: Secondary | ICD-10-CM | POA: Diagnosis not present

## 2017-07-28 DIAGNOSIS — Z9181 History of falling: Secondary | ICD-10-CM | POA: Diagnosis not present

## 2017-07-28 DIAGNOSIS — F325 Major depressive disorder, single episode, in full remission: Secondary | ICD-10-CM | POA: Diagnosis not present

## 2017-07-28 DIAGNOSIS — I129 Hypertensive chronic kidney disease with stage 1 through stage 4 chronic kidney disease, or unspecified chronic kidney disease: Secondary | ICD-10-CM | POA: Diagnosis not present

## 2017-07-28 DIAGNOSIS — I480 Paroxysmal atrial fibrillation: Secondary | ICD-10-CM | POA: Diagnosis not present

## 2017-07-28 DIAGNOSIS — N183 Chronic kidney disease, stage 3 (moderate): Secondary | ICD-10-CM | POA: Diagnosis not present

## 2017-07-28 DIAGNOSIS — E1121 Type 2 diabetes mellitus with diabetic nephropathy: Secondary | ICD-10-CM | POA: Diagnosis not present

## 2017-07-28 DIAGNOSIS — E1140 Type 2 diabetes mellitus with diabetic neuropathy, unspecified: Secondary | ICD-10-CM | POA: Diagnosis not present

## 2017-07-31 DIAGNOSIS — Z9181 History of falling: Secondary | ICD-10-CM | POA: Diagnosis not present

## 2017-07-31 DIAGNOSIS — R278 Other lack of coordination: Secondary | ICD-10-CM | POA: Diagnosis not present

## 2017-08-04 DIAGNOSIS — Z9181 History of falling: Secondary | ICD-10-CM | POA: Diagnosis not present

## 2017-08-04 DIAGNOSIS — R278 Other lack of coordination: Secondary | ICD-10-CM | POA: Diagnosis not present

## 2017-08-04 DIAGNOSIS — M25511 Pain in right shoulder: Secondary | ICD-10-CM | POA: Diagnosis not present

## 2017-08-04 DIAGNOSIS — M62441 Contracture of muscle, right hand: Secondary | ICD-10-CM | POA: Diagnosis not present

## 2017-08-05 DIAGNOSIS — M25511 Pain in right shoulder: Secondary | ICD-10-CM | POA: Diagnosis not present

## 2017-08-05 DIAGNOSIS — Z9181 History of falling: Secondary | ICD-10-CM | POA: Diagnosis not present

## 2017-08-05 DIAGNOSIS — R278 Other lack of coordination: Secondary | ICD-10-CM | POA: Diagnosis not present

## 2017-08-05 DIAGNOSIS — M62441 Contracture of muscle, right hand: Secondary | ICD-10-CM | POA: Diagnosis not present

## 2017-08-06 DIAGNOSIS — Z9181 History of falling: Secondary | ICD-10-CM | POA: Diagnosis not present

## 2017-08-06 DIAGNOSIS — M25511 Pain in right shoulder: Secondary | ICD-10-CM | POA: Diagnosis not present

## 2017-08-06 DIAGNOSIS — M62441 Contracture of muscle, right hand: Secondary | ICD-10-CM | POA: Diagnosis not present

## 2017-08-06 DIAGNOSIS — R278 Other lack of coordination: Secondary | ICD-10-CM | POA: Diagnosis not present

## 2017-08-07 DIAGNOSIS — Z9181 History of falling: Secondary | ICD-10-CM | POA: Diagnosis not present

## 2017-08-07 DIAGNOSIS — R278 Other lack of coordination: Secondary | ICD-10-CM | POA: Diagnosis not present

## 2017-08-07 DIAGNOSIS — M25511 Pain in right shoulder: Secondary | ICD-10-CM | POA: Diagnosis not present

## 2017-08-07 DIAGNOSIS — M62441 Contracture of muscle, right hand: Secondary | ICD-10-CM | POA: Diagnosis not present

## 2017-08-10 DIAGNOSIS — M25511 Pain in right shoulder: Secondary | ICD-10-CM | POA: Diagnosis not present

## 2017-08-10 DIAGNOSIS — Z9181 History of falling: Secondary | ICD-10-CM | POA: Diagnosis not present

## 2017-08-10 DIAGNOSIS — M62441 Contracture of muscle, right hand: Secondary | ICD-10-CM | POA: Diagnosis not present

## 2017-08-10 DIAGNOSIS — R278 Other lack of coordination: Secondary | ICD-10-CM | POA: Diagnosis not present

## 2017-08-11 DIAGNOSIS — R278 Other lack of coordination: Secondary | ICD-10-CM | POA: Diagnosis not present

## 2017-08-11 DIAGNOSIS — M25511 Pain in right shoulder: Secondary | ICD-10-CM | POA: Diagnosis not present

## 2017-08-11 DIAGNOSIS — M62441 Contracture of muscle, right hand: Secondary | ICD-10-CM | POA: Diagnosis not present

## 2017-08-11 DIAGNOSIS — Z9181 History of falling: Secondary | ICD-10-CM | POA: Diagnosis not present

## 2017-08-12 DIAGNOSIS — R278 Other lack of coordination: Secondary | ICD-10-CM | POA: Diagnosis not present

## 2017-08-12 DIAGNOSIS — Z9181 History of falling: Secondary | ICD-10-CM | POA: Diagnosis not present

## 2017-08-12 DIAGNOSIS — M25511 Pain in right shoulder: Secondary | ICD-10-CM | POA: Diagnosis not present

## 2017-08-12 DIAGNOSIS — M62441 Contracture of muscle, right hand: Secondary | ICD-10-CM | POA: Diagnosis not present

## 2017-08-13 DIAGNOSIS — R278 Other lack of coordination: Secondary | ICD-10-CM | POA: Diagnosis not present

## 2017-08-13 DIAGNOSIS — M62441 Contracture of muscle, right hand: Secondary | ICD-10-CM | POA: Diagnosis not present

## 2017-08-13 DIAGNOSIS — Z9181 History of falling: Secondary | ICD-10-CM | POA: Diagnosis not present

## 2017-08-13 DIAGNOSIS — M25511 Pain in right shoulder: Secondary | ICD-10-CM | POA: Diagnosis not present

## 2017-08-17 DIAGNOSIS — R278 Other lack of coordination: Secondary | ICD-10-CM | POA: Diagnosis not present

## 2017-08-17 DIAGNOSIS — M62441 Contracture of muscle, right hand: Secondary | ICD-10-CM | POA: Diagnosis not present

## 2017-08-17 DIAGNOSIS — Z9181 History of falling: Secondary | ICD-10-CM | POA: Diagnosis not present

## 2017-08-17 DIAGNOSIS — M25511 Pain in right shoulder: Secondary | ICD-10-CM | POA: Diagnosis not present

## 2017-08-18 DIAGNOSIS — Z9181 History of falling: Secondary | ICD-10-CM | POA: Diagnosis not present

## 2017-08-18 DIAGNOSIS — M62441 Contracture of muscle, right hand: Secondary | ICD-10-CM | POA: Diagnosis not present

## 2017-08-18 DIAGNOSIS — M25511 Pain in right shoulder: Secondary | ICD-10-CM | POA: Diagnosis not present

## 2017-08-18 DIAGNOSIS — R278 Other lack of coordination: Secondary | ICD-10-CM | POA: Diagnosis not present

## 2017-08-21 DIAGNOSIS — M25511 Pain in right shoulder: Secondary | ICD-10-CM | POA: Diagnosis not present

## 2017-08-21 DIAGNOSIS — Z9181 History of falling: Secondary | ICD-10-CM | POA: Diagnosis not present

## 2017-08-21 DIAGNOSIS — M62441 Contracture of muscle, right hand: Secondary | ICD-10-CM | POA: Diagnosis not present

## 2017-08-21 DIAGNOSIS — R278 Other lack of coordination: Secondary | ICD-10-CM | POA: Diagnosis not present

## 2017-08-24 DIAGNOSIS — M25511 Pain in right shoulder: Secondary | ICD-10-CM | POA: Diagnosis not present

## 2017-08-24 DIAGNOSIS — M62441 Contracture of muscle, right hand: Secondary | ICD-10-CM | POA: Diagnosis not present

## 2017-08-24 DIAGNOSIS — Z9181 History of falling: Secondary | ICD-10-CM | POA: Diagnosis not present

## 2017-08-24 DIAGNOSIS — R278 Other lack of coordination: Secondary | ICD-10-CM | POA: Diagnosis not present

## 2017-08-25 DIAGNOSIS — M62441 Contracture of muscle, right hand: Secondary | ICD-10-CM | POA: Diagnosis not present

## 2017-08-25 DIAGNOSIS — M25511 Pain in right shoulder: Secondary | ICD-10-CM | POA: Diagnosis not present

## 2017-08-25 DIAGNOSIS — R278 Other lack of coordination: Secondary | ICD-10-CM | POA: Diagnosis not present

## 2017-08-25 DIAGNOSIS — Z9181 History of falling: Secondary | ICD-10-CM | POA: Diagnosis not present

## 2017-08-26 DIAGNOSIS — M25511 Pain in right shoulder: Secondary | ICD-10-CM | POA: Diagnosis not present

## 2017-08-26 DIAGNOSIS — Z9181 History of falling: Secondary | ICD-10-CM | POA: Diagnosis not present

## 2017-08-26 DIAGNOSIS — R278 Other lack of coordination: Secondary | ICD-10-CM | POA: Diagnosis not present

## 2017-08-26 DIAGNOSIS — M62441 Contracture of muscle, right hand: Secondary | ICD-10-CM | POA: Diagnosis not present

## 2017-08-27 DIAGNOSIS — E1121 Type 2 diabetes mellitus with diabetic nephropathy: Secondary | ICD-10-CM | POA: Diagnosis not present

## 2017-08-27 DIAGNOSIS — M62441 Contracture of muscle, right hand: Secondary | ICD-10-CM | POA: Diagnosis not present

## 2017-08-27 DIAGNOSIS — Z9181 History of falling: Secondary | ICD-10-CM | POA: Diagnosis not present

## 2017-08-27 DIAGNOSIS — I129 Hypertensive chronic kidney disease with stage 1 through stage 4 chronic kidney disease, or unspecified chronic kidney disease: Secondary | ICD-10-CM | POA: Diagnosis not present

## 2017-08-27 DIAGNOSIS — N183 Chronic kidney disease, stage 3 (moderate): Secondary | ICD-10-CM | POA: Diagnosis not present

## 2017-08-27 DIAGNOSIS — R278 Other lack of coordination: Secondary | ICD-10-CM | POA: Diagnosis not present

## 2017-08-27 DIAGNOSIS — F325 Major depressive disorder, single episode, in full remission: Secondary | ICD-10-CM | POA: Diagnosis not present

## 2017-08-27 DIAGNOSIS — I2510 Atherosclerotic heart disease of native coronary artery without angina pectoris: Secondary | ICD-10-CM | POA: Diagnosis not present

## 2017-08-27 DIAGNOSIS — M25511 Pain in right shoulder: Secondary | ICD-10-CM | POA: Diagnosis not present

## 2017-08-27 DIAGNOSIS — I480 Paroxysmal atrial fibrillation: Secondary | ICD-10-CM | POA: Diagnosis not present

## 2017-08-27 DIAGNOSIS — E1140 Type 2 diabetes mellitus with diabetic neuropathy, unspecified: Secondary | ICD-10-CM | POA: Diagnosis not present

## 2017-08-27 DIAGNOSIS — M810 Age-related osteoporosis without current pathological fracture: Secondary | ICD-10-CM | POA: Diagnosis not present

## 2017-08-28 DIAGNOSIS — M25511 Pain in right shoulder: Secondary | ICD-10-CM | POA: Diagnosis not present

## 2017-08-28 DIAGNOSIS — M62441 Contracture of muscle, right hand: Secondary | ICD-10-CM | POA: Diagnosis not present

## 2017-08-28 DIAGNOSIS — Z9181 History of falling: Secondary | ICD-10-CM | POA: Diagnosis not present

## 2017-08-28 DIAGNOSIS — R278 Other lack of coordination: Secondary | ICD-10-CM | POA: Diagnosis not present

## 2017-08-31 DIAGNOSIS — N183 Chronic kidney disease, stage 3 (moderate): Secondary | ICD-10-CM | POA: Diagnosis not present

## 2017-08-31 DIAGNOSIS — I480 Paroxysmal atrial fibrillation: Secondary | ICD-10-CM | POA: Diagnosis not present

## 2017-08-31 DIAGNOSIS — F325 Major depressive disorder, single episode, in full remission: Secondary | ICD-10-CM | POA: Diagnosis not present

## 2017-08-31 DIAGNOSIS — I2510 Atherosclerotic heart disease of native coronary artery without angina pectoris: Secondary | ICD-10-CM | POA: Diagnosis not present

## 2017-08-31 DIAGNOSIS — R278 Other lack of coordination: Secondary | ICD-10-CM | POA: Diagnosis not present

## 2017-08-31 DIAGNOSIS — E1142 Type 2 diabetes mellitus with diabetic polyneuropathy: Secondary | ICD-10-CM | POA: Diagnosis not present

## 2017-08-31 DIAGNOSIS — E1121 Type 2 diabetes mellitus with diabetic nephropathy: Secondary | ICD-10-CM | POA: Diagnosis not present

## 2017-08-31 DIAGNOSIS — M62441 Contracture of muscle, right hand: Secondary | ICD-10-CM | POA: Diagnosis not present

## 2017-08-31 DIAGNOSIS — I129 Hypertensive chronic kidney disease with stage 1 through stage 4 chronic kidney disease, or unspecified chronic kidney disease: Secondary | ICD-10-CM | POA: Diagnosis not present

## 2017-08-31 DIAGNOSIS — Z9181 History of falling: Secondary | ICD-10-CM | POA: Diagnosis not present

## 2017-08-31 DIAGNOSIS — M25511 Pain in right shoulder: Secondary | ICD-10-CM | POA: Diagnosis not present

## 2017-08-31 DIAGNOSIS — M810 Age-related osteoporosis without current pathological fracture: Secondary | ICD-10-CM | POA: Diagnosis not present

## 2017-08-31 DIAGNOSIS — E1140 Type 2 diabetes mellitus with diabetic neuropathy, unspecified: Secondary | ICD-10-CM | POA: Diagnosis not present

## 2017-09-01 DIAGNOSIS — R278 Other lack of coordination: Secondary | ICD-10-CM | POA: Diagnosis not present

## 2017-09-01 DIAGNOSIS — M25511 Pain in right shoulder: Secondary | ICD-10-CM | POA: Diagnosis not present

## 2017-09-01 DIAGNOSIS — Z9181 History of falling: Secondary | ICD-10-CM | POA: Diagnosis not present

## 2017-09-01 DIAGNOSIS — M62441 Contracture of muscle, right hand: Secondary | ICD-10-CM | POA: Diagnosis not present

## 2017-09-02 DIAGNOSIS — M25511 Pain in right shoulder: Secondary | ICD-10-CM | POA: Diagnosis not present

## 2017-09-02 DIAGNOSIS — M62441 Contracture of muscle, right hand: Secondary | ICD-10-CM | POA: Diagnosis not present

## 2017-09-02 DIAGNOSIS — R278 Other lack of coordination: Secondary | ICD-10-CM | POA: Diagnosis not present

## 2017-09-02 DIAGNOSIS — Z9181 History of falling: Secondary | ICD-10-CM | POA: Diagnosis not present

## 2017-09-04 DIAGNOSIS — R278 Other lack of coordination: Secondary | ICD-10-CM | POA: Diagnosis not present

## 2017-09-04 DIAGNOSIS — M62441 Contracture of muscle, right hand: Secondary | ICD-10-CM | POA: Diagnosis not present

## 2017-09-04 DIAGNOSIS — M25511 Pain in right shoulder: Secondary | ICD-10-CM | POA: Diagnosis not present

## 2017-09-04 DIAGNOSIS — Z9181 History of falling: Secondary | ICD-10-CM | POA: Diagnosis not present

## 2017-09-07 DIAGNOSIS — M25511 Pain in right shoulder: Secondary | ICD-10-CM | POA: Diagnosis not present

## 2017-09-07 DIAGNOSIS — Z9181 History of falling: Secondary | ICD-10-CM | POA: Diagnosis not present

## 2017-09-07 DIAGNOSIS — R278 Other lack of coordination: Secondary | ICD-10-CM | POA: Diagnosis not present

## 2017-09-07 DIAGNOSIS — M62441 Contracture of muscle, right hand: Secondary | ICD-10-CM | POA: Diagnosis not present

## 2017-09-08 DIAGNOSIS — Z9181 History of falling: Secondary | ICD-10-CM | POA: Diagnosis not present

## 2017-09-08 DIAGNOSIS — R278 Other lack of coordination: Secondary | ICD-10-CM | POA: Diagnosis not present

## 2017-09-08 DIAGNOSIS — M62441 Contracture of muscle, right hand: Secondary | ICD-10-CM | POA: Diagnosis not present

## 2017-09-08 DIAGNOSIS — M25511 Pain in right shoulder: Secondary | ICD-10-CM | POA: Diagnosis not present

## 2017-09-09 DIAGNOSIS — R278 Other lack of coordination: Secondary | ICD-10-CM | POA: Diagnosis not present

## 2017-09-09 DIAGNOSIS — Z9181 History of falling: Secondary | ICD-10-CM | POA: Diagnosis not present

## 2017-09-09 DIAGNOSIS — M62441 Contracture of muscle, right hand: Secondary | ICD-10-CM | POA: Diagnosis not present

## 2017-09-09 DIAGNOSIS — M25511 Pain in right shoulder: Secondary | ICD-10-CM | POA: Diagnosis not present

## 2017-09-11 DIAGNOSIS — Z9181 History of falling: Secondary | ICD-10-CM | POA: Diagnosis not present

## 2017-09-11 DIAGNOSIS — R278 Other lack of coordination: Secondary | ICD-10-CM | POA: Diagnosis not present

## 2017-09-11 DIAGNOSIS — M62441 Contracture of muscle, right hand: Secondary | ICD-10-CM | POA: Diagnosis not present

## 2017-09-11 DIAGNOSIS — M25511 Pain in right shoulder: Secondary | ICD-10-CM | POA: Diagnosis not present

## 2017-09-14 DIAGNOSIS — M62441 Contracture of muscle, right hand: Secondary | ICD-10-CM | POA: Diagnosis not present

## 2017-09-14 DIAGNOSIS — M25511 Pain in right shoulder: Secondary | ICD-10-CM | POA: Diagnosis not present

## 2017-09-14 DIAGNOSIS — R278 Other lack of coordination: Secondary | ICD-10-CM | POA: Diagnosis not present

## 2017-09-14 DIAGNOSIS — Z9181 History of falling: Secondary | ICD-10-CM | POA: Diagnosis not present

## 2017-09-16 DIAGNOSIS — R278 Other lack of coordination: Secondary | ICD-10-CM | POA: Diagnosis not present

## 2017-09-16 DIAGNOSIS — M62441 Contracture of muscle, right hand: Secondary | ICD-10-CM | POA: Diagnosis not present

## 2017-09-16 DIAGNOSIS — M25511 Pain in right shoulder: Secondary | ICD-10-CM | POA: Diagnosis not present

## 2017-09-16 DIAGNOSIS — Z9181 History of falling: Secondary | ICD-10-CM | POA: Diagnosis not present

## 2017-09-18 ENCOUNTER — Encounter (HOSPITAL_COMMUNITY): Payer: Self-pay | Admitting: Emergency Medicine

## 2017-09-18 ENCOUNTER — Emergency Department (HOSPITAL_COMMUNITY)
Admission: EM | Admit: 2017-09-18 | Discharge: 2017-09-18 | Disposition: A | Discharge status: Home / Self Care | Payer: Medicare Other | Attending: Emergency Medicine | Admitting: Emergency Medicine

## 2017-09-18 DIAGNOSIS — Z5321 Procedure and treatment not carried out due to patient leaving prior to being seen by health care provider: Secondary | ICD-10-CM | POA: Diagnosis not present

## 2017-09-18 DIAGNOSIS — S300XXA Contusion of lower back and pelvis, initial encounter: Secondary | ICD-10-CM | POA: Diagnosis not present

## 2017-09-18 DIAGNOSIS — M1612 Unilateral primary osteoarthritis, left hip: Secondary | ICD-10-CM | POA: Diagnosis not present

## 2017-09-18 DIAGNOSIS — R52 Pain, unspecified: Secondary | ICD-10-CM | POA: Insufficient documentation

## 2017-09-18 DIAGNOSIS — M5489 Other dorsalgia: Secondary | ICD-10-CM | POA: Diagnosis not present

## 2017-09-18 DIAGNOSIS — M65331 Trigger finger, right middle finger: Secondary | ICD-10-CM | POA: Diagnosis not present

## 2017-09-18 DIAGNOSIS — T148XXA Other injury of unspecified body region, initial encounter: Secondary | ICD-10-CM | POA: Diagnosis not present

## 2017-09-18 NOTE — ED Triage Notes (Signed)
Per [facility] patient from [facility] for fall in shower c/o of buttock pain.  Patient no blood thinners nor LOC.  Patient ambulatory with walker.

## 2017-09-18 NOTE — ED Notes (Signed)
Per daughter, patient can get into her PCP now, so they are leaving to go there.

## 2017-09-21 DIAGNOSIS — M62441 Contracture of muscle, right hand: Secondary | ICD-10-CM | POA: Diagnosis not present

## 2017-09-21 DIAGNOSIS — M25511 Pain in right shoulder: Secondary | ICD-10-CM | POA: Diagnosis not present

## 2017-09-21 DIAGNOSIS — Z9181 History of falling: Secondary | ICD-10-CM | POA: Diagnosis not present

## 2017-09-21 DIAGNOSIS — R278 Other lack of coordination: Secondary | ICD-10-CM | POA: Diagnosis not present

## 2017-09-22 DIAGNOSIS — M62441 Contracture of muscle, right hand: Secondary | ICD-10-CM | POA: Diagnosis not present

## 2017-09-22 DIAGNOSIS — M25511 Pain in right shoulder: Secondary | ICD-10-CM | POA: Diagnosis not present

## 2017-09-22 DIAGNOSIS — Z9181 History of falling: Secondary | ICD-10-CM | POA: Diagnosis not present

## 2017-09-22 DIAGNOSIS — R278 Other lack of coordination: Secondary | ICD-10-CM | POA: Diagnosis not present

## 2017-09-24 DIAGNOSIS — Z9181 History of falling: Secondary | ICD-10-CM | POA: Diagnosis not present

## 2017-09-24 DIAGNOSIS — R278 Other lack of coordination: Secondary | ICD-10-CM | POA: Diagnosis not present

## 2017-09-24 DIAGNOSIS — M62441 Contracture of muscle, right hand: Secondary | ICD-10-CM | POA: Diagnosis not present

## 2017-09-24 DIAGNOSIS — M25511 Pain in right shoulder: Secondary | ICD-10-CM | POA: Diagnosis not present

## 2017-09-25 DIAGNOSIS — M25511 Pain in right shoulder: Secondary | ICD-10-CM | POA: Diagnosis not present

## 2017-09-25 DIAGNOSIS — M62441 Contracture of muscle, right hand: Secondary | ICD-10-CM | POA: Diagnosis not present

## 2017-09-25 DIAGNOSIS — R278 Other lack of coordination: Secondary | ICD-10-CM | POA: Diagnosis not present

## 2017-09-25 DIAGNOSIS — Z9181 History of falling: Secondary | ICD-10-CM | POA: Diagnosis not present

## 2017-09-28 DIAGNOSIS — R278 Other lack of coordination: Secondary | ICD-10-CM | POA: Diagnosis not present

## 2017-09-28 DIAGNOSIS — M62441 Contracture of muscle, right hand: Secondary | ICD-10-CM | POA: Diagnosis not present

## 2017-09-28 DIAGNOSIS — M25511 Pain in right shoulder: Secondary | ICD-10-CM | POA: Diagnosis not present

## 2017-09-28 DIAGNOSIS — Z9181 History of falling: Secondary | ICD-10-CM | POA: Diagnosis not present

## 2017-09-29 DIAGNOSIS — R278 Other lack of coordination: Secondary | ICD-10-CM | POA: Diagnosis not present

## 2017-09-29 DIAGNOSIS — Z9181 History of falling: Secondary | ICD-10-CM | POA: Diagnosis not present

## 2017-09-29 DIAGNOSIS — M62441 Contracture of muscle, right hand: Secondary | ICD-10-CM | POA: Diagnosis not present

## 2017-09-29 DIAGNOSIS — M25511 Pain in right shoulder: Secondary | ICD-10-CM | POA: Diagnosis not present

## 2017-09-30 DIAGNOSIS — M25511 Pain in right shoulder: Secondary | ICD-10-CM | POA: Diagnosis not present

## 2017-09-30 DIAGNOSIS — M62441 Contracture of muscle, right hand: Secondary | ICD-10-CM | POA: Diagnosis not present

## 2017-09-30 DIAGNOSIS — Z9181 History of falling: Secondary | ICD-10-CM | POA: Diagnosis not present

## 2017-09-30 DIAGNOSIS — R278 Other lack of coordination: Secondary | ICD-10-CM | POA: Diagnosis not present

## 2017-10-01 DIAGNOSIS — M25511 Pain in right shoulder: Secondary | ICD-10-CM | POA: Diagnosis not present

## 2017-10-01 DIAGNOSIS — M62441 Contracture of muscle, right hand: Secondary | ICD-10-CM | POA: Diagnosis not present

## 2017-10-01 DIAGNOSIS — Z9181 History of falling: Secondary | ICD-10-CM | POA: Diagnosis not present

## 2017-10-06 DIAGNOSIS — M62441 Contracture of muscle, right hand: Secondary | ICD-10-CM | POA: Diagnosis not present

## 2017-10-06 DIAGNOSIS — M25511 Pain in right shoulder: Secondary | ICD-10-CM | POA: Diagnosis not present

## 2017-10-06 DIAGNOSIS — Z9181 History of falling: Secondary | ICD-10-CM | POA: Diagnosis not present

## 2017-10-07 DIAGNOSIS — M25511 Pain in right shoulder: Secondary | ICD-10-CM | POA: Diagnosis not present

## 2017-10-07 DIAGNOSIS — M62441 Contracture of muscle, right hand: Secondary | ICD-10-CM | POA: Diagnosis not present

## 2017-10-07 DIAGNOSIS — Z9181 History of falling: Secondary | ICD-10-CM | POA: Diagnosis not present

## 2017-10-08 DIAGNOSIS — Z9181 History of falling: Secondary | ICD-10-CM | POA: Diagnosis not present

## 2017-10-08 DIAGNOSIS — M25511 Pain in right shoulder: Secondary | ICD-10-CM | POA: Diagnosis not present

## 2017-10-08 DIAGNOSIS — M62441 Contracture of muscle, right hand: Secondary | ICD-10-CM | POA: Diagnosis not present

## 2017-10-09 ENCOUNTER — Telehealth: Payer: Self-pay | Admitting: *Deleted

## 2017-10-09 NOTE — Telephone Encounter (Signed)
   [facility] Pre-operative Risk Assessment    Request for surgical clearance:  1. What type of surgery is being performed? RIGHT TRIGGER FINGER RELEASE   2. When is this surgery scheduled? PENDING/REQUESTED URGENT   3. Are there any medications that need to be held prior to surgery and how long?NONE/OKAY TO STAY ON ASPIRIN   4. Practice name and name of physician performing surgery? Dorna Leitz MD   5. What is your office phone and fax number? PH=[phone]  [phone]   6. Anesthesia type (None, local, MAC, general) ? UNKNOWN   Fredia Beets 10/09/2017, 7:02 AM  _________________________________________________________________   (provider comments below)

## 2017-10-09 NOTE — Telephone Encounter (Signed)
    Chart reviewed as part of pre-operative protocol coverage. Last seen by Dr. Percival Spanish 02/10/17. Spoke with daughter (patient is not there to speak). Patient will call back on Tuesday afternoon  to review cardiac symptoms.    [place], PA  10/09/2017, 4:24 PM

## 2017-10-13 ENCOUNTER — Encounter: Payer: Self-pay | Admitting: Cardiology

## 2017-10-13 DIAGNOSIS — M25511 Pain in right shoulder: Secondary | ICD-10-CM | POA: Diagnosis not present

## 2017-10-13 DIAGNOSIS — Z9181 History of falling: Secondary | ICD-10-CM | POA: Diagnosis not present

## 2017-10-13 DIAGNOSIS — M62441 Contracture of muscle, right hand: Secondary | ICD-10-CM | POA: Diagnosis not present

## 2017-10-13 NOTE — Telephone Encounter (Signed)
Romie Minus, please do not use the C CV DIV PREOP tag. This erroneously sends your messages to the entire APP team like the issue we discussed last week. Will forward to the P CV DIV PREOP for covering team to address. Dayna Dunn PA-C

## 2017-10-13 NOTE — Telephone Encounter (Signed)
   Chart reviewed as part of pre-operative protocol coverage. Given past medical history and time since last visit, based on ACC/AHA guidelines, Jessica King would be at acceptable risk for the planned procedure without further cardiovascular testing.  She has no chest pain and no SOB.  The pt tells me this procedure is done under local.  She is at low risk under local but with general anesthesia she would be at high risk.     Cecilie Kicks, NP 10/13/2017, 3:00 PM

## 2017-10-13 NOTE — Telephone Encounter (Signed)
Medical clearance has been sent over to Dr Dorna Leitz office

## 2017-10-13 NOTE — Telephone Encounter (Signed)
Follow up     Pt daughter (caregiver) calling back about pre op clearance

## 2017-10-13 NOTE — Telephone Encounter (Signed)
Cheryl,the daughter is returning the call.

## 2017-10-14 NOTE — Telephone Encounter (Signed)
Just came back from vacation and this was in my basket. I am sure just an oversight. Looks like it was for pre-op pool.

## 2017-10-15 DIAGNOSIS — Z9181 History of falling: Secondary | ICD-10-CM | POA: Diagnosis not present

## 2017-10-15 DIAGNOSIS — M25511 Pain in right shoulder: Secondary | ICD-10-CM | POA: Diagnosis not present

## 2017-10-15 DIAGNOSIS — M62441 Contracture of muscle, right hand: Secondary | ICD-10-CM | POA: Diagnosis not present

## 2017-10-15 NOTE — Telephone Encounter (Signed)
Not sure why I am getting these. They have been addressed

## 2017-10-19 DIAGNOSIS — M65331 Trigger finger, right middle finger: Secondary | ICD-10-CM | POA: Diagnosis not present

## 2017-10-20 DIAGNOSIS — M62441 Contracture of muscle, right hand: Secondary | ICD-10-CM | POA: Diagnosis not present

## 2017-10-20 DIAGNOSIS — Z9181 History of falling: Secondary | ICD-10-CM | POA: Diagnosis not present

## 2017-10-20 DIAGNOSIS — M25511 Pain in right shoulder: Secondary | ICD-10-CM | POA: Diagnosis not present

## 2017-10-21 ENCOUNTER — Other Ambulatory Visit: Payer: Self-pay | Admitting: Geriatric Medicine

## 2017-10-21 DIAGNOSIS — M62441 Contracture of muscle, right hand: Secondary | ICD-10-CM | POA: Diagnosis not present

## 2017-10-21 DIAGNOSIS — Z9181 History of falling: Secondary | ICD-10-CM | POA: Diagnosis not present

## 2017-10-21 DIAGNOSIS — R413 Other amnesia: Secondary | ICD-10-CM | POA: Diagnosis not present

## 2017-10-21 DIAGNOSIS — R1310 Dysphagia, unspecified: Secondary | ICD-10-CM | POA: Diagnosis not present

## 2017-10-21 DIAGNOSIS — I129 Hypertensive chronic kidney disease with stage 1 through stage 4 chronic kidney disease, or unspecified chronic kidney disease: Secondary | ICD-10-CM | POA: Diagnosis not present

## 2017-10-21 DIAGNOSIS — M25511 Pain in right shoulder: Secondary | ICD-10-CM | POA: Diagnosis not present

## 2017-10-21 DIAGNOSIS — E7800 Pure hypercholesterolemia, unspecified: Secondary | ICD-10-CM | POA: Diagnosis not present

## 2017-10-21 DIAGNOSIS — Z79899 Other long term (current) drug therapy: Secondary | ICD-10-CM | POA: Diagnosis not present

## 2017-10-21 DIAGNOSIS — E1142 Type 2 diabetes mellitus with diabetic polyneuropathy: Secondary | ICD-10-CM | POA: Diagnosis not present

## 2017-10-21 DIAGNOSIS — Z7984 Long term (current) use of oral hypoglycemic drugs: Secondary | ICD-10-CM | POA: Diagnosis not present

## 2017-10-21 DIAGNOSIS — E1121 Type 2 diabetes mellitus with diabetic nephropathy: Secondary | ICD-10-CM | POA: Diagnosis not present

## 2017-10-21 DIAGNOSIS — I480 Paroxysmal atrial fibrillation: Secondary | ICD-10-CM | POA: Diagnosis not present

## 2017-10-21 DIAGNOSIS — N183 Chronic kidney disease, stage 3 (moderate): Secondary | ICD-10-CM | POA: Diagnosis not present

## 2017-10-26 ENCOUNTER — Other Ambulatory Visit: Payer: Medicare Other

## 2017-10-26 DIAGNOSIS — M65331 Trigger finger, right middle finger: Secondary | ICD-10-CM | POA: Diagnosis not present

## 2017-10-26 DIAGNOSIS — M62441 Contracture of muscle, right hand: Secondary | ICD-10-CM | POA: Diagnosis not present

## 2017-10-26 DIAGNOSIS — Z9181 History of falling: Secondary | ICD-10-CM | POA: Diagnosis not present

## 2017-10-26 DIAGNOSIS — M25511 Pain in right shoulder: Secondary | ICD-10-CM | POA: Diagnosis not present

## 2017-10-27 DIAGNOSIS — M25511 Pain in right shoulder: Secondary | ICD-10-CM | POA: Diagnosis not present

## 2017-10-27 DIAGNOSIS — M62441 Contracture of muscle, right hand: Secondary | ICD-10-CM | POA: Diagnosis not present

## 2017-10-27 DIAGNOSIS — Z9181 History of falling: Secondary | ICD-10-CM | POA: Diagnosis not present

## 2017-10-29 DIAGNOSIS — Z9181 History of falling: Secondary | ICD-10-CM | POA: Diagnosis not present

## 2017-10-29 DIAGNOSIS — M62441 Contracture of muscle, right hand: Secondary | ICD-10-CM | POA: Diagnosis not present

## 2017-10-29 DIAGNOSIS — M25511 Pain in right shoulder: Secondary | ICD-10-CM | POA: Diagnosis not present

## 2017-10-30 ENCOUNTER — Ambulatory Visit
Admission: RE | Admit: 2017-10-30 | Discharge: 2017-10-30 | Disposition: A | Discharge status: Home / Self Care | Payer: Medicare Other | Source: Ambulatory Visit | Attending: Geriatric Medicine | Admitting: Geriatric Medicine

## 2017-10-30 DIAGNOSIS — R1310 Dysphagia, unspecified: Secondary | ICD-10-CM | POA: Diagnosis not present

## 2017-11-03 DIAGNOSIS — Z9181 History of falling: Secondary | ICD-10-CM | POA: Diagnosis not present

## 2017-11-03 DIAGNOSIS — M25511 Pain in right shoulder: Secondary | ICD-10-CM | POA: Diagnosis not present

## 2017-11-03 DIAGNOSIS — M62441 Contracture of muscle, right hand: Secondary | ICD-10-CM | POA: Diagnosis not present

## 2017-11-03 DIAGNOSIS — Z9889 Other specified postprocedural states: Secondary | ICD-10-CM | POA: Diagnosis not present

## 2017-11-04 DIAGNOSIS — M25511 Pain in right shoulder: Secondary | ICD-10-CM | POA: Diagnosis not present

## 2017-11-04 DIAGNOSIS — Z9181 History of falling: Secondary | ICD-10-CM | POA: Diagnosis not present

## 2017-11-04 DIAGNOSIS — M62441 Contracture of muscle, right hand: Secondary | ICD-10-CM | POA: Diagnosis not present

## 2017-11-11 DIAGNOSIS — M25511 Pain in right shoulder: Secondary | ICD-10-CM | POA: Diagnosis not present

## 2017-11-11 DIAGNOSIS — M62441 Contracture of muscle, right hand: Secondary | ICD-10-CM | POA: Diagnosis not present

## 2017-11-11 DIAGNOSIS — Z9181 History of falling: Secondary | ICD-10-CM | POA: Diagnosis not present

## 2017-11-12 DIAGNOSIS — M25552 Pain in left hip: Secondary | ICD-10-CM | POA: Diagnosis not present

## 2017-11-13 DIAGNOSIS — Z9181 History of falling: Secondary | ICD-10-CM | POA: Diagnosis not present

## 2017-11-13 DIAGNOSIS — M25511 Pain in right shoulder: Secondary | ICD-10-CM | POA: Diagnosis not present

## 2017-11-13 DIAGNOSIS — M62441 Contracture of muscle, right hand: Secondary | ICD-10-CM | POA: Diagnosis not present

## 2017-11-17 DIAGNOSIS — S0990XA Unspecified injury of head, initial encounter: Secondary | ICD-10-CM | POA: Diagnosis not present

## 2017-11-17 DIAGNOSIS — S064X0A Epidural hemorrhage without loss of consciousness, initial encounter: Secondary | ICD-10-CM | POA: Diagnosis not present

## 2017-11-18 ENCOUNTER — Emergency Department (HOSPITAL_COMMUNITY): Payer: Medicare Other

## 2017-11-18 ENCOUNTER — Other Ambulatory Visit: Payer: Self-pay

## 2017-11-18 ENCOUNTER — Emergency Department (HOSPITAL_COMMUNITY)
Admission: EM | Admit: 2017-11-18 | Discharge: 2017-11-18 | Disposition: A | Discharge status: Home / Self Care | Payer: Medicare Other | Attending: Emergency Medicine | Admitting: Emergency Medicine

## 2017-11-18 DIAGNOSIS — S0083XA Contusion of other part of head, initial encounter: Secondary | ICD-10-CM | POA: Insufficient documentation

## 2017-11-18 DIAGNOSIS — I5022 Chronic systolic (congestive) heart failure: Secondary | ICD-10-CM | POA: Diagnosis not present

## 2017-11-18 DIAGNOSIS — Y929 Unspecified place or not applicable: Secondary | ICD-10-CM | POA: Diagnosis not present

## 2017-11-18 DIAGNOSIS — E119 Type 2 diabetes mellitus without complications: Secondary | ICD-10-CM | POA: Diagnosis not present

## 2017-11-18 DIAGNOSIS — Z79899 Other long term (current) drug therapy: Secondary | ICD-10-CM | POA: Diagnosis not present

## 2017-11-18 DIAGNOSIS — W19XXXA Unspecified fall, initial encounter: Secondary | ICD-10-CM

## 2017-11-18 DIAGNOSIS — S199XXA Unspecified injury of neck, initial encounter: Secondary | ICD-10-CM | POA: Diagnosis not present

## 2017-11-18 DIAGNOSIS — S0990XA Unspecified injury of head, initial encounter: Secondary | ICD-10-CM

## 2017-11-18 DIAGNOSIS — Z7982 Long term (current) use of aspirin: Secondary | ICD-10-CM | POA: Insufficient documentation

## 2017-11-18 DIAGNOSIS — Y9389 Activity, other specified: Secondary | ICD-10-CM | POA: Diagnosis not present

## 2017-11-18 DIAGNOSIS — W06XXXA Fall from bed, initial encounter: Secondary | ICD-10-CM | POA: Insufficient documentation

## 2017-11-18 DIAGNOSIS — Z7984 Long term (current) use of oral hypoglycemic drugs: Secondary | ICD-10-CM | POA: Diagnosis not present

## 2017-11-18 DIAGNOSIS — S4991XA Unspecified injury of right shoulder and upper arm, initial encounter: Secondary | ICD-10-CM | POA: Diagnosis not present

## 2017-11-18 DIAGNOSIS — M25511 Pain in right shoulder: Secondary | ICD-10-CM | POA: Diagnosis not present

## 2017-11-18 DIAGNOSIS — Y999 Unspecified external cause status: Secondary | ICD-10-CM | POA: Diagnosis not present

## 2017-11-18 DIAGNOSIS — I110 Hypertensive heart disease with heart failure: Secondary | ICD-10-CM | POA: Diagnosis not present

## 2017-11-18 DIAGNOSIS — S0081XA Abrasion of other part of head, initial encounter: Secondary | ICD-10-CM | POA: Diagnosis not present

## 2017-11-18 DIAGNOSIS — M79621 Pain in right upper arm: Secondary | ICD-10-CM | POA: Diagnosis not present

## 2017-11-18 NOTE — ED Notes (Signed)
Patient transported to CT 

## 2017-11-18 NOTE — Discharge Instructions (Signed)
You may take Tylenol 1000 mg every 6 hours as needed for pain. °

## 2017-11-18 NOTE — ED Notes (Signed)
Pt daughter stated she will take mother back to assisted living facility @ [facility] off of [address].

## 2017-11-18 NOTE — ED Triage Notes (Signed)
Pt arrived [facility] with c/o of unwitnessed fall from sitting and hitting her head on floor. Pt is not on any blood thinners and no LOC. Pt has visual Right forehead Hematoma. Pt also c/o of Minor pain in right shoulder.  Vitals 136/80 82 pulse 12 RR cbg 316

## 2017-11-18 NOTE — ED Notes (Signed)
Bed: WA10 Expected date: 11/18/17 Expected time: 12:02 AM Means of arrival: Ambulance Comments: 83 yr old fall, hematoma

## 2017-11-18 NOTE — ED Provider Notes (Signed)
TIME SEEN: 12:43 AM  CHIEF COMPLAINT: Fall  HPI: Patient is an 83 year old female with history of CAD, CHF, hypertension, diabetes, hyperlipidemia, peripheral neuropathy with frequent falls who fell today prior to arrival.  States that she was talking on the phone while in bed and dropped the phone when she bent down to get that she fell.  She did strike the right side of her head and right shoulder on the floor.  There is no loss of consciousness.  She denies being on antiplatelets or anticoagulants.  No chest pain, shortness of breath, abdominal pain, neck or back pain.  No numbness, tingling or focal weakness.  Last tetanus vaccination was August 2018.  ROS: See HPI Constitutional: no fever  Eyes: no drainage  ENT: no runny nose   Cardiovascular:  no chest pain  Resp: no SOB  GI: no vomiting GU: no dysuria Integumentary: no rash  Allergy: no hives  Musculoskeletal: no leg swelling  Neurological: no slurred speech ROS otherwise negative  PAST MEDICAL HISTORY/PAST SURGICAL HISTORY:  Past Medical History:  Diagnosis Date  . Abdominal fibromatosis   . Allergic rhinitis   . CAD (coronary artery disease)   . Chronic systolic congestive heart failure ([place])   . Coronary artery disease    Cardiac cath 1999, 2007, 2010, Taxus stent x 2 to LAD 2007.   . Diabetes mellitus without complication ([place])   . Diverticulosis   . Hyperlipidemia   . Hypertension   . Iron deficiency anemia   . Peripheral neuropathy    Frequent fall  . PVD (peripheral vascular disease) ([place])    Subclavian stent May 2013 (subclavian steal)    MEDICATIONS:  Prior to Admission medications   Medication Sig Start Date End Date Taking? Authorizing Provider  aspirin EC 81 MG tablet Take 1 tablet (81 mg total) by mouth daily. 10/13/16   Minus Breeding, MD  Ca Carbonate-Mag Hydroxide (ROLAIDS) 550-110 MG CHEW Chew 1 tablet by mouth as needed (for heart burn).    [provider]  clonazePAM (KLONOPIN) 0.5 MG  tablet Take 0.5 mg by mouth 2 (two) times daily. Takes 1 tab in daytime as needed and 1 tab every bedtime    [provider]  CRANBERRY PO Take 4,200 mg by mouth daily.    [provider]  gabapentin (NEURONTIN) 300 MG capsule Take 300-600 mg by mouth See admin instructions. Take 1 capsule at 8 am and 12 pm and then take 2 capsules at bedtime    [provider]  HYDROcodone-acetaminophen (NORCO) 7.5-325 MG per tablet Take 1 tablet by mouth every 6 (six) hours as needed for moderate pain.     [provider]  lisinopril (PRINIVIL,ZESTRIL) 5 MG tablet Take 1 tablet (5 mg total) by mouth daily. 09/23/16   Mikhail, Velta Addison, DO  loratadine (CLARITIN) 10 MG tablet Take 10 mg by mouth daily as needed for allergies.     [provider]  metFORMIN (GLUCOPHAGE) 1000 MG tablet Take 1,000 mg by mouth 2 (two) times daily with a meal.    [provider]  methocarbamol (ROBAXIN) 750 MG tablet Take 750 mg by mouth every 6 (six) hours as needed for muscle spasms.     [provider]  Multiple Vitamin (MULTIVITAMIN WITH MINERALS) TABS tablet Take 1 tablet by mouth daily.    [provider]  PARoxetine (PAXIL) 20 MG tablet Take 20 mg by mouth daily.    [provider]  simvastatin (ZOCOR) 40 MG tablet Take  40 mg by mouth daily.    [provider]  zolpidem (AMBIEN) 5 MG tablet Take 1 tablet (5 mg total) by mouth at bedtime as needed for sleep. 09/23/16   Cristal Ford, DO    ALLERGIES:  Allergies  Allergen Reactions  . Sulfa Antibiotics Swelling and Other (See Comments)    Maybe other reactions, unknown specifically    SOCIAL HISTORY:  Social History   Tobacco Use  . Smoking status: Never Smoker  . Smokeless tobacco: Never Used  Substance Use Topics  . Alcohol use: No    FAMILY HISTORY: Family History  Problem Relation Age of Onset  . Hypertension Mother   . Heart disease Mother        No details.  Died at  age 48  . Diabetes Father   . Heart attack Brother 68    EXAM: BP 125/82 (BP Location: Left Arm)   Pulse 67   Temp 97.8 F (36.6 C) (Oral)   Resp 16   Ht 5\' 3"  (1.6 m)   Wt 67.6 kg (149 lb)   SpO2 96%   BMI 26.39 kg/m  CONSTITUTIONAL: Alert and oriented x4 and responds appropriately to questions. Well-appearing; well-nourished; GCS 22, elderly HEAD: Normocephalic; hematoma and abrasion to the right forehead EYES: Conjunctivae clear, PERRL, EOMI ENT: normal nose; no rhinorrhea; moist mucous membranes; pharynx without lesions noted; no dental injury; no septal hematoma NECK: Supple, no meningismus, no LAD; no midline spinal tenderness, step-off or deformity; trachea midline CARD: RRR; S1 and S2 appreciated; no murmurs, no clicks, no rubs, no gallops RESP: Normal chest excursion without splinting or tachypnea; breath sounds clear and equal bilaterally; no wheezes, no rhonchi, no rales; no hypoxia or respiratory distress CHEST:  chest wall stable, no crepitus or ecchymosis or deformity, nontender to palpation; no flail chest ABD/GI: Normal bowel sounds; non-distended; soft, non-tender, no rebound, no guarding; no ecchymosis or other lesions noted PELVIS:  stable, nontender to palpation BACK:  The back appears normal and is non-tender to palpation, there is no CVA tenderness; no midline spinal tenderness, step-off or deformity EXT: Mildly tender to palpation over the proximal right humerus and lateral right shoulder without loss of fullness or deformity.  Slightly decreased range of motion in the shoulder which she reports is chronic.  Otherwise normal ROM in all joints; otherwise extremities are non-tender to palpation; no edema; normal capillary refill; no cyanosis, no joint effusion, compartments are soft, extremities are warm and well-perfused, no ecchymosis SKIN: Normal color for age and race; warm NEURO: Moves all extremities equally, normal sensation diffusely, no pronator drift,  cranial nerves II through XII intact, normal speech PSYCH: The patient's mood and manner are appropriate. Grooming and personal hygiene are appropriate.  MEDICAL DECISION MAKING: Patient here with mechanical fall.  Will obtain CT of her head, cervical spine, x-rays of her right arm.  She declines pain medication at this time.  She does not have any laceration that needs repair.  Tetanus vaccination up-to-date.  Neurologically intact.  Hemodynamically stable.  ED PROGRESS: Patient's imaging shows no acute abnormality other than right scalp hematoma without skull fracture.  I feel she is safe to be discharged.  Patient has no complaints at this time.  Remains hemodynamically stable and neurologically intact.  Discussed head injury return precautions.  Patient and family comfortable with this plan.   At this time, I do not feel there is any life-threatening condition present. I have reviewed and discussed all results (EKG, imaging, lab, urine  as appropriate) and exam findings with patient/family. I have reviewed nursing notes and appropriate previous records.  I feel the patient is safe to be discharged home without further emergent workup and can continue workup as an outpatient as needed. Discussed usual and customary return precautions. Patient/family verbalize understanding and are comfortable with this plan.  Outpatient follow-up has been provided if needed. All questions have been answered.      Thos Matsumoto, Delice Bison, DO 11/18/17 [id number]

## 2017-11-18 NOTE — ED Notes (Signed)
Gash on right side of head above eyebrow has been cleaned and dressed. 0.9%NS used and dressed with Tefalon dressing and secured with Tegaderm.

## 2017-12-03 DIAGNOSIS — M1711 Unilateral primary osteoarthritis, right knee: Secondary | ICD-10-CM | POA: Diagnosis not present

## 2017-12-03 DIAGNOSIS — M1712 Unilateral primary osteoarthritis, left knee: Secondary | ICD-10-CM | POA: Diagnosis not present

## 2017-12-06 ENCOUNTER — Encounter (HOSPITAL_COMMUNITY): Payer: Self-pay

## 2017-12-06 ENCOUNTER — Other Ambulatory Visit: Payer: Self-pay

## 2017-12-06 ENCOUNTER — Emergency Department (HOSPITAL_COMMUNITY)
Admission: EM | Admit: 2017-12-06 | Discharge: 2017-12-06 | Disposition: A | Discharge status: Home / Self Care | Payer: Medicare Other | Attending: Emergency Medicine | Admitting: Emergency Medicine

## 2017-12-06 ENCOUNTER — Emergency Department (HOSPITAL_COMMUNITY): Payer: Medicare Other

## 2017-12-06 DIAGNOSIS — Z7984 Long term (current) use of oral hypoglycemic drugs: Secondary | ICD-10-CM | POA: Diagnosis not present

## 2017-12-06 DIAGNOSIS — S199XXA Unspecified injury of neck, initial encounter: Secondary | ICD-10-CM | POA: Diagnosis not present

## 2017-12-06 DIAGNOSIS — I2510 Atherosclerotic heart disease of native coronary artery without angina pectoris: Secondary | ICD-10-CM | POA: Diagnosis not present

## 2017-12-06 DIAGNOSIS — S0240DA Maxillary fracture, left side, initial encounter for closed fracture: Secondary | ICD-10-CM

## 2017-12-06 DIAGNOSIS — Z79899 Other long term (current) drug therapy: Secondary | ICD-10-CM | POA: Diagnosis not present

## 2017-12-06 DIAGNOSIS — W0110XA Fall on same level from slipping, tripping and stumbling with subsequent striking against unspecified object, initial encounter: Secondary | ICD-10-CM | POA: Diagnosis not present

## 2017-12-06 DIAGNOSIS — S0083XA Contusion of other part of head, initial encounter: Secondary | ICD-10-CM | POA: Diagnosis not present

## 2017-12-06 DIAGNOSIS — Y998 Other external cause status: Secondary | ICD-10-CM | POA: Insufficient documentation

## 2017-12-06 DIAGNOSIS — Y9389 Activity, other specified: Secondary | ICD-10-CM | POA: Insufficient documentation

## 2017-12-06 DIAGNOSIS — I10 Essential (primary) hypertension: Secondary | ICD-10-CM | POA: Insufficient documentation

## 2017-12-06 DIAGNOSIS — S0990XA Unspecified injury of head, initial encounter: Secondary | ICD-10-CM | POA: Diagnosis not present

## 2017-12-06 DIAGNOSIS — E119 Type 2 diabetes mellitus without complications: Secondary | ICD-10-CM | POA: Diagnosis not present

## 2017-12-06 DIAGNOSIS — E785 Hyperlipidemia, unspecified: Secondary | ICD-10-CM | POA: Insufficient documentation

## 2017-12-06 DIAGNOSIS — Y92002 Bathroom of unspecified non-institutional (private) residence single-family (private) house as the place of occurrence of the external cause: Secondary | ICD-10-CM | POA: Diagnosis not present

## 2017-12-06 DIAGNOSIS — I4891 Unspecified atrial fibrillation: Secondary | ICD-10-CM | POA: Diagnosis not present

## 2017-12-06 DIAGNOSIS — Z7982 Long term (current) use of aspirin: Secondary | ICD-10-CM | POA: Insufficient documentation

## 2017-12-06 DIAGNOSIS — S0993XA Unspecified injury of face, initial encounter: Secondary | ICD-10-CM | POA: Diagnosis not present

## 2017-12-06 MED ORDER — HYDROCODONE-ACETAMINOPHEN 5-325 MG PO TABS: 1.0000 | ORAL_TABLET | ORAL | 0 refills | Status: DC | PRN

## 2017-12-06 MED ORDER — HYDROCODONE-ACETAMINOPHEN 5-325 MG PO TABS
2.0000 | ORAL_TABLET | Freq: Once | ORAL | Status: AC
  Administered 2017-12-06: 2 via ORAL
  Filled 2017-12-06: qty 2

## 2017-12-06 NOTE — Discharge Instructions (Signed)
1.  You may take 1-2 of your Vicodin every 6 hours as needed. 2.  Keep the head of your bed elevated at 30 degrees to help decrease swelling. 3.  Use frozen peas or other conformable frozen back to help with swelling.  Wrap well to avoid injury to your skin and apply for about 20 minutes every hour.

## 2017-12-06 NOTE — ED Provider Notes (Signed)
[facility] DEPT Provider Note   CSN: [id number] Arrival date & time: 12/06/17  1108     History   Chief Complaint Chief Complaint  Patient presents with  . Fall    HPI Jessica King is a 84 y.o. female.  HPI Patient reports she was standing up and leaning on the wall trying to pull up her pants after using the bathroom.  She reports she lost her balance and it caused her to fall forward striking her face.  She denies getting knocked out.  She reports she does have a lot of pain in her face.  Denies other associated injury.  She denies being on anticoagulants.  Patient does also have an older bruise on the other side of her face.  She reports that from a fall over a week or more ago.  She reports she gets injections in her knees for knee pain.  She reports sometimes it almost seems to make her knees more unsteady and worse.  She denies any increased pain or redness.  She reports she did get some knee injections in the past week or so. Past Medical History:  Diagnosis Date  . Abdominal fibromatosis   . Allergic rhinitis   . CAD (coronary artery disease)   . Chronic systolic congestive heart failure ([place])   . Coronary artery disease    Cardiac cath 1999, 2007, 2010, Taxus stent x 2 to LAD 2007.   . Diabetes mellitus without complication ([place])   . Diverticulosis   . Hyperlipidemia   . Hypertension   . Iron deficiency anemia   . Peripheral neuropathy    Frequent fall  . PVD (peripheral vascular disease) ([place])    Subclavian stent May 2013 (subclavian steal)    Patient Active Problem List   Diagnosis Date Noted  . Syncope 09/22/2016  . Fall 09/22/2016  . Hyperkalemia 09/22/2016  . Atrial fibrillation ([place]) 09/22/2016  . Diabetes mellitus without complication ([place]) 22/97/9892  . Chronic systolic congestive heart failure ([place])   . Abdominal fibromatosis   . Hyperlipidemia   . Hypertension   . Essential hypertension   . Laceration of scalp  without foreign body   . Syncope and collapse   . Dizziness 05/02/2015  . CAD (coronary artery disease) 05/02/2015  . PVD (peripheral vascular disease) ([place]) 05/02/2015    Past Surgical History:  Procedure Laterality Date  . BACK SURGERY     x 3 or 4 lumbar, cervical x 2  . CEA    . CORONARY ARTERY BYPASS GRAFT  2011   x 3   . Coronary Stents x3      OB History    No data available       Home Medications    Prior to Admission medications   Medication Sig Start Date End Date Taking? Authorizing Provider  aspirin EC 81 MG tablet Take 1 tablet (81 mg total) by mouth daily. 10/13/16   Minus Breeding, MD  Ca Carbonate-Mag Hydroxide (ROLAIDS) 550-110 MG CHEW Chew 1 tablet by mouth as needed (for heart burn).    [provider]  clonazePAM (KLONOPIN) 0.5 MG tablet Take 0.5 mg by mouth 2 (two) times daily. Takes 1 tab in daytime as needed and 1 tab every bedtime    [provider]  CRANBERRY PO Take 4,200 mg by mouth daily.    [provider]  gabapentin (NEURONTIN) 300 MG capsule Take 300-600 mg by mouth See admin instructions. Take 1 capsule at 8 am  and 12 pm and then take 2 capsules at bedtime    [provider]  HYDROcodone-acetaminophen (NORCO) 7.5-325 MG per tablet Take 1 tablet by mouth every 6 (six) hours as needed for moderate pain.     [provider]  HYDROcodone-acetaminophen (NORCO/VICODIN) 5-325 MG tablet Take 1-2 tablets by mouth every 4 (four) hours as needed for moderate pain or severe pain. 12/06/17   Charlesetta Shanks, MD  lisinopril (PRINIVIL,ZESTRIL) 5 MG tablet Take 1 tablet (5 mg total) by mouth daily. 09/23/16   Mikhail, Velta Addison, DO  loratadine (CLARITIN) 10 MG tablet Take 10 mg by mouth daily as needed for allergies.     [provider]  metFORMIN (GLUCOPHAGE) 1000 MG tablet Take 1,000 mg by mouth 2 (two) times daily with a meal.    [provider]  methocarbamol (ROBAXIN) 750 MG tablet Take 750 mg by  mouth every 6 (six) hours as needed for muscle spasms.     [provider]  Multiple Vitamin (MULTIVITAMIN WITH MINERALS) TABS tablet Take 1 tablet by mouth daily.    [provider]  PARoxetine (PAXIL) 20 MG tablet Take 20 mg by mouth daily.    [provider]  simvastatin (ZOCOR) 40 MG tablet Take 40 mg by mouth daily.    [provider]  zolpidem (AMBIEN) 5 MG tablet Take 1 tablet (5 mg total) by mouth at bedtime as needed for sleep. 09/23/16   Cristal Ford, DO    Family History Family History  Problem Relation Age of Onset  . Hypertension Mother   . Heart disease Mother        No details.  Died at age 53  . Diabetes Father   . Heart attack Brother 72    Social History Social History   Tobacco Use  . Smoking status: Never Smoker  . Smokeless tobacco: Never Used  Substance Use Topics  . Alcohol use: No  . Drug use: No     Allergies   Sulfa antibiotics   Review of Systems Review of Systems 10 Systems reviewed and are negative for acute change except as noted in the HPI.   Physical Exam Updated Vital Signs BP (!) 175/63 (BP Location: Right Arm)   Pulse 63   Temp 97.8 F (36.6 C) (Oral)   Resp 18   Ht 5\' 3"  (1.6 m)   Wt 67.6 kg (149 lb)   SpO2 96%   BMI 26.39 kg/m   Physical Exam  Constitutional: She is oriented to person, place, and time.  She is alert and appropriate.  GCS is 15.  Obvious facial trauma.  No respiratory distress.  HENT:  Patient has large hematoma of the left face.  See attached image.  No active nasal bleeding.  Small amount of fresh blood in the left nasal passage.  No clot or obstruction.  No septal hematoma.  No dental injury airway is clear and patent.  Eyes: EOM are normal. Pupils are equal, round, and reactive to light.  Normal extraocular motions in all directions.  Neck: Neck supple.  Cardiovascular: Normal rate, regular rhythm and normal heart sounds.  Pulmonary/Chest: Effort normal and  breath sounds normal.  Abdominal: Soft. She exhibits no distension. There is no tenderness. There is no guarding.  Musculoskeletal: Normal range of motion. She exhibits no edema, tenderness or deformity.  Bilateral knees no effusion or erythema.  Patient spontaneously will put her legs to range of motion with flexion extension without difficulty.  Neurological: She is  alert and oriented to person, place, and time. No cranial nerve deficit. She exhibits normal muscle tone. Coordination normal.  Skin: Skin is warm and dry.  Psychiatric: She has a normal mood and affect.             ED Treatments / Results  Labs (all labs ordered are listed, but only abnormal results are displayed) Labs Reviewed - No data to display  EKG  EKG Interpretation None       Radiology Ct Head Wo Contrast  Result Date: 12/06/2017 CLINICAL DATA:  84 year old female status post unwitnessed fall EXAM: CT HEAD WITHOUT CONTRAST CT MAXILLOFACIAL WITHOUT CONTRAST CT CERVICAL SPINE WITHOUT CONTRAST TECHNIQUE: Multidetector CT imaging of the head, cervical spine, and maxillofacial structures were performed using the standard protocol without intravenous contrast. Multiplanar CT image reconstructions of the cervical spine and maxillofacial structures were also generated. COMPARISON:  Recent prior CT scan of the head and cervical spine 11/18/2017 FINDINGS: CT HEAD FINDINGS Brain: No evidence of acute infarction, hemorrhage, hydrocephalus, extra-axial collection or mass lesion/mass effect. Mild atrophy and periventricular white matter hypoattenuation consistent with chronic microvascular ischemic white matter disease. Vascular: Calcifications of the bilateral cavernous carotid arteries. Skull: Normal. Negative for fracture or focal lesion. Other: None. CT MAXILLOFACIAL FINDINGS Osseous: Acute mildly displaced fracture through the frontal process of the left maxilla. The anterior wall of the maxillary sinus an the orbit  remain intact. The zygoma is also intact. Orbits: Negative. No traumatic or inflammatory finding. Sinuses: Clear. Soft tissues: Hematoma overlying the left maxillary antrum extending superiorly into the preseptal periorbital soft tissues and inferiorly to the mandible. CT CERVICAL SPINE FINDINGS Alignment: Normal. Skull base and vertebrae: No acute fracture. No primary bone lesion or focal pathologic process. Extensive postsurgical changes including cannulated lag screw repair of an odontoid fracture of C2. No evidence of hardware complication. Additionally, surgical changes of C6 corpectomy with combined anterior and posterior fusion (posterior extending from C5 through T2, and anterior extending from C5 through C7 with an interbody cage at C6) without evidence of hardware complication. Multilevel mild degenerative disc disease. Soft tissues and spinal canal: No prevertebral fluid or swelling. No visible canal hematoma. Disc levels:  No focal level specific disease. Upper chest: Negative. Other: None. IMPRESSION: CT HEAD 1. No acute intracranial abnormality. 2. Stable atrophy and chronic microvascular ischemic white matter disease. CT FACE 1. Acute fracture of the front process of the left maxilla with overlying soft tissue hematoma. CT CSPINE 1. No acute fractures or malalignment. 2. Extensive surgical changes as described above without evidence of hardware complication. Electronically Signed   By: Jacqulynn Cadet M.D.   On: 12/06/2017 12:50   Ct Cervical Spine Wo Contrast  Result Date: 12/06/2017 CLINICAL DATA:  84 year old female status post unwitnessed fall EXAM: CT HEAD WITHOUT CONTRAST CT MAXILLOFACIAL WITHOUT CONTRAST CT CERVICAL SPINE WITHOUT CONTRAST TECHNIQUE: Multidetector CT imaging of the head, cervical spine, and maxillofacial structures were performed using the standard protocol without intravenous contrast. Multiplanar CT image reconstructions of the cervical spine and maxillofacial structures  were also generated. COMPARISON:  Recent prior CT scan of the head and cervical spine 11/18/2017 FINDINGS: CT HEAD FINDINGS Brain: No evidence of acute infarction, hemorrhage, hydrocephalus, extra-axial collection or mass lesion/mass effect. Mild atrophy and periventricular white matter hypoattenuation consistent with chronic microvascular ischemic white matter disease. Vascular: Calcifications of the bilateral cavernous carotid arteries. Skull: Normal. Negative for fracture or focal lesion. Other: None. CT MAXILLOFACIAL FINDINGS Osseous: Acute mildly displaced fracture through the  frontal process of the left maxilla. The anterior wall of the maxillary sinus an the orbit remain intact. The zygoma is also intact. Orbits: Negative. No traumatic or inflammatory finding. Sinuses: Clear. Soft tissues: Hematoma overlying the left maxillary antrum extending superiorly into the preseptal periorbital soft tissues and inferiorly to the mandible. CT CERVICAL SPINE FINDINGS Alignment: Normal. Skull base and vertebrae: No acute fracture. No primary bone lesion or focal pathologic process. Extensive postsurgical changes including cannulated lag screw repair of an odontoid fracture of C2. No evidence of hardware complication. Additionally, surgical changes of C6 corpectomy with combined anterior and posterior fusion (posterior extending from C5 through T2, and anterior extending from C5 through C7 with an interbody cage at C6) without evidence of hardware complication. Multilevel mild degenerative disc disease. Soft tissues and spinal canal: No prevertebral fluid or swelling. No visible canal hematoma. Disc levels:  No focal level specific disease. Upper chest: Negative. Other: None. IMPRESSION: CT HEAD 1. No acute intracranial abnormality. 2. Stable atrophy and chronic microvascular ischemic white matter disease. CT FACE 1. Acute fracture of the front process of the left maxilla with overlying soft tissue hematoma. CT CSPINE 1.  No acute fractures or malalignment. 2. Extensive surgical changes as described above without evidence of hardware complication. Electronically Signed   By: Jacqulynn Cadet M.D.   On: 12/06/2017 12:50   Ct Maxillofacial Wo Cm  Result Date: 12/06/2017 CLINICAL DATA:  84 year old female status post unwitnessed fall EXAM: CT HEAD WITHOUT CONTRAST CT MAXILLOFACIAL WITHOUT CONTRAST CT CERVICAL SPINE WITHOUT CONTRAST TECHNIQUE: Multidetector CT imaging of the head, cervical spine, and maxillofacial structures were performed using the standard protocol without intravenous contrast. Multiplanar CT image reconstructions of the cervical spine and maxillofacial structures were also generated. COMPARISON:  Recent prior CT scan of the head and cervical spine 11/18/2017 FINDINGS: CT HEAD FINDINGS Brain: No evidence of acute infarction, hemorrhage, hydrocephalus, extra-axial collection or mass lesion/mass effect. Mild atrophy and periventricular white matter hypoattenuation consistent with chronic microvascular ischemic white matter disease. Vascular: Calcifications of the bilateral cavernous carotid arteries. Skull: Normal. Negative for fracture or focal lesion. Other: None. CT MAXILLOFACIAL FINDINGS Osseous: Acute mildly displaced fracture through the frontal process of the left maxilla. The anterior wall of the maxillary sinus an the orbit remain intact. The zygoma is also intact. Orbits: Negative. No traumatic or inflammatory finding. Sinuses: Clear. Soft tissues: Hematoma overlying the left maxillary antrum extending superiorly into the preseptal periorbital soft tissues and inferiorly to the mandible. CT CERVICAL SPINE FINDINGS Alignment: Normal. Skull base and vertebrae: No acute fracture. No primary bone lesion or focal pathologic process. Extensive postsurgical changes including cannulated lag screw repair of an odontoid fracture of C2. No evidence of hardware complication. Additionally, surgical changes of C6  corpectomy with combined anterior and posterior fusion (posterior extending from C5 through T2, and anterior extending from C5 through C7 with an interbody cage at C6) without evidence of hardware complication. Multilevel mild degenerative disc disease. Soft tissues and spinal canal: No prevertebral fluid or swelling. No visible canal hematoma. Disc levels:  No focal level specific disease. Upper chest: Negative. Other: None. IMPRESSION: CT HEAD 1. No acute intracranial abnormality. 2. Stable atrophy and chronic microvascular ischemic white matter disease. CT FACE 1. Acute fracture of the front process of the left maxilla with overlying soft tissue hematoma. CT CSPINE 1. No acute fractures or malalignment. 2. Extensive surgical changes as described above without evidence of hardware complication. Electronically Signed   By: Jacqulynn Cadet  M.D.   On: 12/06/2017 12:50    Procedures Procedures (including critical care time)  Medications Ordered in ED Medications  HYDROcodone-acetaminophen (NORCO/VICODIN) 5-325 MG per tablet 2 tablet (2 tablets Oral Given 12/06/17 1352)     Initial Impression / Assessment and Plan / ED Course  I have reviewed the triage vital signs and the nursing notes.  Pertinent labs & imaging results that were available during my care of the patient were reviewed by me and considered in my medical decision making (see chart for details).      Final Clinical Impressions(s) / ED Diagnoses   Final diagnoses:  Closed fracture of left side of maxilla, initial encounter ([place])  Contusion of face, initial encounter  CT facial bone shows a small maxillary fracture.  This corresponds with large amount of swelling the patient's face.  Her dentition is intact there is no intraoral bleeding.  Extraocular motions are normal.  Head and C-spine CTs without acute findings.  Patient's mental status is good.  She normally takes 1 Vicodin 4-5 times a day for pain.  Patient is counseled on  taking 1-2 every 4-6 hours if needed for added pain control from baseline.  Counseled on maintaining 30 degree upright position of head and icing with well wrapped cold pack.  Patient's daughter is present with her as well.  Patient will return to assisted living.  ED Discharge Orders        Ordered    HYDROcodone-acetaminophen (NORCO/VICODIN) 5-325 MG tablet  Every 4 hours PRN     12/06/17 1442       Charlesetta Shanks, MD 12/06/17 1539

## 2017-12-06 NOTE — ED Triage Notes (Addendum)
Pt BIB EMS from [facility] for an unwitnessed fall. Pt states she was leaning on the wall of the bathroom pulling up pants when she lost balance, slipped, and fell hitting face on toilet. Pt has swelling/bruising to left side of face, bruising to right side of face, and c/o some pain in neck. Denies dizziness before fall. No blood thinners, no LOC. Hx of weak knees and falls in the past. A/Ox4.

## 2017-12-08 DIAGNOSIS — M1712 Unilateral primary osteoarthritis, left knee: Secondary | ICD-10-CM | POA: Diagnosis not present

## 2017-12-08 DIAGNOSIS — M1711 Unilateral primary osteoarthritis, right knee: Secondary | ICD-10-CM | POA: Diagnosis not present

## 2017-12-09 DIAGNOSIS — S0292XD Unspecified fracture of facial bones, subsequent encounter for fracture with routine healing: Secondary | ICD-10-CM | POA: Diagnosis not present

## 2017-12-09 DIAGNOSIS — I129 Hypertensive chronic kidney disease with stage 1 through stage 4 chronic kidney disease, or unspecified chronic kidney disease: Secondary | ICD-10-CM | POA: Diagnosis not present

## 2017-12-09 DIAGNOSIS — K5901 Slow transit constipation: Secondary | ICD-10-CM | POA: Diagnosis not present

## 2017-12-09 DIAGNOSIS — N183 Chronic kidney disease, stage 3 (moderate): Secondary | ICD-10-CM | POA: Diagnosis not present

## 2017-12-09 DIAGNOSIS — W19XXXD Unspecified fall, subsequent encounter: Secondary | ICD-10-CM | POA: Diagnosis not present

## 2017-12-15 DIAGNOSIS — M545 Low back pain: Secondary | ICD-10-CM | POA: Diagnosis not present

## 2017-12-15 DIAGNOSIS — M25551 Pain in right hip: Secondary | ICD-10-CM | POA: Diagnosis not present

## 2017-12-17 DIAGNOSIS — R2681 Unsteadiness on feet: Secondary | ICD-10-CM | POA: Diagnosis not present

## 2017-12-17 DIAGNOSIS — M25641 Stiffness of right hand, not elsewhere classified: Secondary | ICD-10-CM | POA: Diagnosis not present

## 2017-12-17 DIAGNOSIS — R296 Repeated falls: Secondary | ICD-10-CM | POA: Diagnosis not present

## 2017-12-17 DIAGNOSIS — R278 Other lack of coordination: Secondary | ICD-10-CM | POA: Diagnosis not present

## 2017-12-18 DIAGNOSIS — M25641 Stiffness of right hand, not elsewhere classified: Secondary | ICD-10-CM | POA: Diagnosis not present

## 2017-12-18 DIAGNOSIS — R278 Other lack of coordination: Secondary | ICD-10-CM | POA: Diagnosis not present

## 2017-12-18 DIAGNOSIS — R2681 Unsteadiness on feet: Secondary | ICD-10-CM | POA: Diagnosis not present

## 2017-12-18 DIAGNOSIS — R296 Repeated falls: Secondary | ICD-10-CM | POA: Diagnosis not present

## 2017-12-21 DIAGNOSIS — R296 Repeated falls: Secondary | ICD-10-CM | POA: Diagnosis not present

## 2017-12-21 DIAGNOSIS — R278 Other lack of coordination: Secondary | ICD-10-CM | POA: Diagnosis not present

## 2017-12-21 DIAGNOSIS — R2681 Unsteadiness on feet: Secondary | ICD-10-CM | POA: Diagnosis not present

## 2017-12-21 DIAGNOSIS — M25641 Stiffness of right hand, not elsewhere classified: Secondary | ICD-10-CM | POA: Diagnosis not present

## 2017-12-22 DIAGNOSIS — R278 Other lack of coordination: Secondary | ICD-10-CM | POA: Diagnosis not present

## 2017-12-22 DIAGNOSIS — R296 Repeated falls: Secondary | ICD-10-CM | POA: Diagnosis not present

## 2017-12-22 DIAGNOSIS — M25641 Stiffness of right hand, not elsewhere classified: Secondary | ICD-10-CM | POA: Diagnosis not present

## 2017-12-22 DIAGNOSIS — R2681 Unsteadiness on feet: Secondary | ICD-10-CM | POA: Diagnosis not present

## 2017-12-23 DIAGNOSIS — N183 Chronic kidney disease, stage 3 (moderate): Secondary | ICD-10-CM | POA: Diagnosis not present

## 2017-12-23 DIAGNOSIS — R296 Repeated falls: Secondary | ICD-10-CM | POA: Diagnosis not present

## 2017-12-23 DIAGNOSIS — G3184 Mild cognitive impairment, so stated: Secondary | ICD-10-CM | POA: Diagnosis not present

## 2017-12-23 DIAGNOSIS — M25641 Stiffness of right hand, not elsewhere classified: Secondary | ICD-10-CM | POA: Diagnosis not present

## 2017-12-23 DIAGNOSIS — R2681 Unsteadiness on feet: Secondary | ICD-10-CM | POA: Diagnosis not present

## 2017-12-23 DIAGNOSIS — I129 Hypertensive chronic kidney disease with stage 1 through stage 4 chronic kidney disease, or unspecified chronic kidney disease: Secondary | ICD-10-CM | POA: Diagnosis not present

## 2017-12-23 DIAGNOSIS — R278 Other lack of coordination: Secondary | ICD-10-CM | POA: Diagnosis not present

## 2017-12-28 DIAGNOSIS — M25641 Stiffness of right hand, not elsewhere classified: Secondary | ICD-10-CM | POA: Diagnosis not present

## 2017-12-28 DIAGNOSIS — R278 Other lack of coordination: Secondary | ICD-10-CM | POA: Diagnosis not present

## 2017-12-28 DIAGNOSIS — R296 Repeated falls: Secondary | ICD-10-CM | POA: Diagnosis not present

## 2017-12-28 DIAGNOSIS — R2681 Unsteadiness on feet: Secondary | ICD-10-CM | POA: Diagnosis not present

## 2017-12-29 DIAGNOSIS — M25641 Stiffness of right hand, not elsewhere classified: Secondary | ICD-10-CM | POA: Diagnosis not present

## 2017-12-29 DIAGNOSIS — R278 Other lack of coordination: Secondary | ICD-10-CM | POA: Diagnosis not present

## 2017-12-29 DIAGNOSIS — R296 Repeated falls: Secondary | ICD-10-CM | POA: Diagnosis not present

## 2017-12-29 DIAGNOSIS — R2681 Unsteadiness on feet: Secondary | ICD-10-CM | POA: Diagnosis not present

## 2017-12-31 DIAGNOSIS — R296 Repeated falls: Secondary | ICD-10-CM | POA: Diagnosis not present

## 2017-12-31 DIAGNOSIS — M25641 Stiffness of right hand, not elsewhere classified: Secondary | ICD-10-CM | POA: Diagnosis not present

## 2017-12-31 DIAGNOSIS — R278 Other lack of coordination: Secondary | ICD-10-CM | POA: Diagnosis not present

## 2017-12-31 DIAGNOSIS — R2681 Unsteadiness on feet: Secondary | ICD-10-CM | POA: Diagnosis not present

## 2018-01-01 DIAGNOSIS — R2681 Unsteadiness on feet: Secondary | ICD-10-CM | POA: Diagnosis not present

## 2018-01-01 DIAGNOSIS — M25641 Stiffness of right hand, not elsewhere classified: Secondary | ICD-10-CM | POA: Diagnosis not present

## 2018-01-01 DIAGNOSIS — R296 Repeated falls: Secondary | ICD-10-CM | POA: Diagnosis not present

## 2018-01-01 DIAGNOSIS — R278 Other lack of coordination: Secondary | ICD-10-CM | POA: Diagnosis not present

## 2018-01-05 DIAGNOSIS — M25641 Stiffness of right hand, not elsewhere classified: Secondary | ICD-10-CM | POA: Diagnosis not present

## 2018-01-05 DIAGNOSIS — R278 Other lack of coordination: Secondary | ICD-10-CM | POA: Diagnosis not present

## 2018-01-05 DIAGNOSIS — R296 Repeated falls: Secondary | ICD-10-CM | POA: Diagnosis not present

## 2018-01-05 DIAGNOSIS — R2681 Unsteadiness on feet: Secondary | ICD-10-CM | POA: Diagnosis not present

## 2018-01-06 DIAGNOSIS — M25641 Stiffness of right hand, not elsewhere classified: Secondary | ICD-10-CM | POA: Diagnosis not present

## 2018-01-06 DIAGNOSIS — R278 Other lack of coordination: Secondary | ICD-10-CM | POA: Diagnosis not present

## 2018-01-06 DIAGNOSIS — R2681 Unsteadiness on feet: Secondary | ICD-10-CM | POA: Diagnosis not present

## 2018-01-06 DIAGNOSIS — R296 Repeated falls: Secondary | ICD-10-CM | POA: Diagnosis not present

## 2018-01-07 DIAGNOSIS — M25641 Stiffness of right hand, not elsewhere classified: Secondary | ICD-10-CM | POA: Diagnosis not present

## 2018-01-07 DIAGNOSIS — R296 Repeated falls: Secondary | ICD-10-CM | POA: Diagnosis not present

## 2018-01-07 DIAGNOSIS — R2681 Unsteadiness on feet: Secondary | ICD-10-CM | POA: Diagnosis not present

## 2018-01-07 DIAGNOSIS — R278 Other lack of coordination: Secondary | ICD-10-CM | POA: Diagnosis not present

## 2018-01-12 DIAGNOSIS — M25641 Stiffness of right hand, not elsewhere classified: Secondary | ICD-10-CM | POA: Diagnosis not present

## 2018-01-12 DIAGNOSIS — R278 Other lack of coordination: Secondary | ICD-10-CM | POA: Diagnosis not present

## 2018-01-12 DIAGNOSIS — R2681 Unsteadiness on feet: Secondary | ICD-10-CM | POA: Diagnosis not present

## 2018-01-12 DIAGNOSIS — R296 Repeated falls: Secondary | ICD-10-CM | POA: Diagnosis not present

## 2018-01-13 DIAGNOSIS — R2681 Unsteadiness on feet: Secondary | ICD-10-CM | POA: Diagnosis not present

## 2018-01-13 DIAGNOSIS — R278 Other lack of coordination: Secondary | ICD-10-CM | POA: Diagnosis not present

## 2018-01-13 DIAGNOSIS — M25641 Stiffness of right hand, not elsewhere classified: Secondary | ICD-10-CM | POA: Diagnosis not present

## 2018-01-13 DIAGNOSIS — R296 Repeated falls: Secondary | ICD-10-CM | POA: Diagnosis not present

## 2018-01-15 DIAGNOSIS — R296 Repeated falls: Secondary | ICD-10-CM | POA: Diagnosis not present

## 2018-01-15 DIAGNOSIS — R278 Other lack of coordination: Secondary | ICD-10-CM | POA: Diagnosis not present

## 2018-01-15 DIAGNOSIS — R2681 Unsteadiness on feet: Secondary | ICD-10-CM | POA: Diagnosis not present

## 2018-01-15 DIAGNOSIS — M25641 Stiffness of right hand, not elsewhere classified: Secondary | ICD-10-CM | POA: Diagnosis not present

## 2018-01-19 DIAGNOSIS — R296 Repeated falls: Secondary | ICD-10-CM | POA: Diagnosis not present

## 2018-01-19 DIAGNOSIS — R2681 Unsteadiness on feet: Secondary | ICD-10-CM | POA: Diagnosis not present

## 2018-01-19 DIAGNOSIS — R278 Other lack of coordination: Secondary | ICD-10-CM | POA: Diagnosis not present

## 2018-01-19 DIAGNOSIS — M25641 Stiffness of right hand, not elsewhere classified: Secondary | ICD-10-CM | POA: Diagnosis not present

## 2018-01-19 DIAGNOSIS — R413 Other amnesia: Secondary | ICD-10-CM | POA: Diagnosis not present

## 2018-01-20 DIAGNOSIS — R296 Repeated falls: Secondary | ICD-10-CM | POA: Diagnosis not present

## 2018-01-20 DIAGNOSIS — R278 Other lack of coordination: Secondary | ICD-10-CM | POA: Diagnosis not present

## 2018-01-20 DIAGNOSIS — M25641 Stiffness of right hand, not elsewhere classified: Secondary | ICD-10-CM | POA: Diagnosis not present

## 2018-01-20 DIAGNOSIS — R2681 Unsteadiness on feet: Secondary | ICD-10-CM | POA: Diagnosis not present

## 2018-01-21 DIAGNOSIS — F330 Major depressive disorder, recurrent, mild: Secondary | ICD-10-CM | POA: Diagnosis not present

## 2018-01-22 DIAGNOSIS — R2681 Unsteadiness on feet: Secondary | ICD-10-CM | POA: Diagnosis not present

## 2018-01-22 DIAGNOSIS — R296 Repeated falls: Secondary | ICD-10-CM | POA: Diagnosis not present

## 2018-01-22 DIAGNOSIS — R278 Other lack of coordination: Secondary | ICD-10-CM | POA: Diagnosis not present

## 2018-01-22 DIAGNOSIS — M25641 Stiffness of right hand, not elsewhere classified: Secondary | ICD-10-CM | POA: Diagnosis not present

## 2018-01-25 DIAGNOSIS — R2681 Unsteadiness on feet: Secondary | ICD-10-CM | POA: Diagnosis not present

## 2018-01-25 DIAGNOSIS — R296 Repeated falls: Secondary | ICD-10-CM | POA: Diagnosis not present

## 2018-01-25 DIAGNOSIS — R278 Other lack of coordination: Secondary | ICD-10-CM | POA: Diagnosis not present

## 2018-01-25 DIAGNOSIS — M25641 Stiffness of right hand, not elsewhere classified: Secondary | ICD-10-CM | POA: Diagnosis not present

## 2018-01-27 DIAGNOSIS — M25641 Stiffness of right hand, not elsewhere classified: Secondary | ICD-10-CM | POA: Diagnosis not present

## 2018-01-27 DIAGNOSIS — R2681 Unsteadiness on feet: Secondary | ICD-10-CM | POA: Diagnosis not present

## 2018-01-27 DIAGNOSIS — R296 Repeated falls: Secondary | ICD-10-CM | POA: Diagnosis not present

## 2018-01-27 DIAGNOSIS — R278 Other lack of coordination: Secondary | ICD-10-CM | POA: Diagnosis not present

## 2018-01-29 DIAGNOSIS — M25641 Stiffness of right hand, not elsewhere classified: Secondary | ICD-10-CM | POA: Diagnosis not present

## 2018-01-29 DIAGNOSIS — R2681 Unsteadiness on feet: Secondary | ICD-10-CM | POA: Diagnosis not present

## 2018-01-29 DIAGNOSIS — R296 Repeated falls: Secondary | ICD-10-CM | POA: Diagnosis not present

## 2018-01-29 DIAGNOSIS — R278 Other lack of coordination: Secondary | ICD-10-CM | POA: Diagnosis not present

## 2018-02-01 DIAGNOSIS — R278 Other lack of coordination: Secondary | ICD-10-CM | POA: Diagnosis not present

## 2018-02-01 DIAGNOSIS — M25641 Stiffness of right hand, not elsewhere classified: Secondary | ICD-10-CM | POA: Diagnosis not present

## 2018-02-01 DIAGNOSIS — R2681 Unsteadiness on feet: Secondary | ICD-10-CM | POA: Diagnosis not present

## 2018-02-01 DIAGNOSIS — R296 Repeated falls: Secondary | ICD-10-CM | POA: Diagnosis not present

## 2018-02-03 ENCOUNTER — Emergency Department (HOSPITAL_COMMUNITY)
Admission: EM | Admit: 2018-02-03 | Discharge: 2018-02-03 | Disposition: A | Discharge status: Home / Self Care | Payer: Medicare Other | Attending: Emergency Medicine | Admitting: Emergency Medicine

## 2018-02-03 ENCOUNTER — Encounter (HOSPITAL_COMMUNITY): Payer: Self-pay | Admitting: Emergency Medicine

## 2018-02-03 ENCOUNTER — Emergency Department (HOSPITAL_COMMUNITY): Payer: Medicare Other

## 2018-02-03 ENCOUNTER — Other Ambulatory Visit: Payer: Self-pay

## 2018-02-03 DIAGNOSIS — S0083XA Contusion of other part of head, initial encounter: Secondary | ICD-10-CM

## 2018-02-03 DIAGNOSIS — Y92122 Bedroom in nursing home as the place of occurrence of the external cause: Secondary | ICD-10-CM | POA: Insufficient documentation

## 2018-02-03 DIAGNOSIS — I2510 Atherosclerotic heart disease of native coronary artery without angina pectoris: Secondary | ICD-10-CM | POA: Diagnosis not present

## 2018-02-03 DIAGNOSIS — Z79899 Other long term (current) drug therapy: Secondary | ICD-10-CM | POA: Diagnosis not present

## 2018-02-03 DIAGNOSIS — W19XXXA Unspecified fall, initial encounter: Secondary | ICD-10-CM

## 2018-02-03 DIAGNOSIS — I110 Hypertensive heart disease with heart failure: Secondary | ICD-10-CM | POA: Insufficient documentation

## 2018-02-03 DIAGNOSIS — I10 Essential (primary) hypertension: Secondary | ICD-10-CM | POA: Diagnosis not present

## 2018-02-03 DIAGNOSIS — Y999 Unspecified external cause status: Secondary | ICD-10-CM | POA: Insufficient documentation

## 2018-02-03 DIAGNOSIS — S0181XA Laceration without foreign body of other part of head, initial encounter: Secondary | ICD-10-CM | POA: Diagnosis not present

## 2018-02-03 DIAGNOSIS — S0990XA Unspecified injury of head, initial encounter: Secondary | ICD-10-CM | POA: Diagnosis present

## 2018-02-03 DIAGNOSIS — Z7982 Long term (current) use of aspirin: Secondary | ICD-10-CM | POA: Diagnosis not present

## 2018-02-03 DIAGNOSIS — S098XXA Other specified injuries of head, initial encounter: Secondary | ICD-10-CM | POA: Diagnosis not present

## 2018-02-03 DIAGNOSIS — Z7984 Long term (current) use of oral hypoglycemic drugs: Secondary | ICD-10-CM | POA: Insufficient documentation

## 2018-02-03 DIAGNOSIS — W06XXXA Fall from bed, initial encounter: Secondary | ICD-10-CM | POA: Insufficient documentation

## 2018-02-03 DIAGNOSIS — E119 Type 2 diabetes mellitus without complications: Secondary | ICD-10-CM | POA: Diagnosis not present

## 2018-02-03 DIAGNOSIS — G8929 Other chronic pain: Secondary | ICD-10-CM | POA: Diagnosis not present

## 2018-02-03 DIAGNOSIS — I5022 Chronic systolic (congestive) heart failure: Secondary | ICD-10-CM | POA: Diagnosis not present

## 2018-02-03 DIAGNOSIS — Y9389 Activity, other specified: Secondary | ICD-10-CM | POA: Diagnosis not present

## 2018-02-03 DIAGNOSIS — S0003XA Contusion of scalp, initial encounter: Secondary | ICD-10-CM | POA: Diagnosis not present

## 2018-02-03 DIAGNOSIS — Z951 Presence of aortocoronary bypass graft: Secondary | ICD-10-CM | POA: Diagnosis not present

## 2018-02-03 DIAGNOSIS — M542 Cervicalgia: Secondary | ICD-10-CM | POA: Diagnosis not present

## 2018-02-03 LAB — CBG MONITORING, ED: GLUCOSE-CAPILLARY: 153 mg/dL — AB (ref 65–99)

## 2018-02-03 NOTE — ED Notes (Signed)
Bed: PB92 Expected date:  Expected time:  Means of arrival:  Comments: Pt in room

## 2018-02-03 NOTE — ED Provider Notes (Signed)
[facility] DEPT Provider Note   CSN: [id number] Arrival date & time: 02/03/18  0718     History   Chief Complaint Chief Complaint  Patient presents with  . Fall    HPI Jessica King is a 84 y.o. female.  HPI  84 year old female with a history of diabetes, peripheral vascular disease, atrial fibrillation and chronic neuropathy presents with fall.  She fell out of her bed while trying to get up for the morning.  This is a recurring issue for her due to her neuropathy.  She never felt lightheaded, dizzy, chest pain, shortness of breath.  She did not pass out before or after the fall.  She has a bruise to her left forehead.  She denies headache.  No new weakness or numbness.  She has chronic neck pain but no new neck pain today.  Denies any complaints.  She is on aspirin but no blood thinner for her atrial fibrillation due to chronic falls.  Past Medical History:  Diagnosis Date  . Abdominal fibromatosis   . Allergic rhinitis   . CAD (coronary artery disease)   . Chronic systolic congestive heart failure ([place])   . Coronary artery disease    Cardiac cath 1999, 2007, 2010, Taxus stent x 2 to LAD 2007.   . Diabetes mellitus without complication ([place])   . Diverticulosis   . Hyperlipidemia   . Hypertension   . Iron deficiency anemia   . Peripheral neuropathy    Frequent fall  . PVD (peripheral vascular disease) ([place])    Subclavian stent May 2013 (subclavian steal)    Patient Active Problem List   Diagnosis Date Noted  . Syncope 09/22/2016  . Fall 09/22/2016  . Hyperkalemia 09/22/2016  . Atrial fibrillation ([place]) 09/22/2016  . Diabetes mellitus without complication ([place]) 69/62/9528  . Chronic systolic congestive heart failure ([place])   . Abdominal fibromatosis   . Hyperlipidemia   . Hypertension   . Essential hypertension   . Laceration of scalp without foreign body   . Syncope and collapse   . Dizziness 05/02/2015  . CAD (coronary artery  disease) 05/02/2015  . PVD (peripheral vascular disease) ([place]) 05/02/2015    Past Surgical History:  Procedure Laterality Date  . BACK SURGERY     x 3 or 4 lumbar, cervical x 2  . CEA    . CORONARY ARTERY BYPASS GRAFT  2011   x 3   . Coronary Stents x3      OB History    No data available       Home Medications    Prior to Admission medications   Medication Sig Start Date End Date Taking? Authorizing Provider  aspirin EC 81 MG tablet Take 1 tablet (81 mg total) by mouth daily. 10/13/16  Yes Minus Breeding, MD  Ca Carbonate-Mag Hydroxide (ROLAIDS) 550-110 MG CHEW Chew 1 tablet by mouth as needed (for heart burn).    [provider]  clonazePAM (KLONOPIN) 0.5 MG tablet Take 0.5 mg by mouth 2 (two) times daily. Takes 1 tab in daytime as needed and 1 tab every bedtime    [provider]  CRANBERRY PO Take 4,200 mg by mouth daily.    [provider]  gabapentin (NEURONTIN) 300 MG capsule Take 300-600 mg by mouth See admin instructions. Take 1 capsule at 8 am and 12 pm and then take 2 capsules at bedtime    [provider]  HYDROcodone-acetaminophen (NORCO) 7.5-325 MG per tablet Take 1  tablet by mouth every 6 (six) hours as needed for moderate pain.     [provider]  HYDROcodone-acetaminophen (NORCO/VICODIN) 5-325 MG tablet Take 1-2 tablets by mouth every 4 (four) hours as needed for moderate pain or severe pain. 12/06/17   Charlesetta Shanks, MD  lisinopril (PRINIVIL,ZESTRIL) 5 MG tablet Take 1 tablet (5 mg total) by mouth daily. 09/23/16   Mikhail, Velta Addison, DO  loratadine (CLARITIN) 10 MG tablet Take 10 mg by mouth daily as needed for allergies.     [provider]  metFORMIN (GLUCOPHAGE) 1000 MG tablet Take 1,000 mg by mouth 2 (two) times daily with a meal.    [provider]  methocarbamol (ROBAXIN) 750 MG tablet Take 750 mg by mouth every 6 (six) hours as needed for muscle spasms.     [provider]  Multiple  Vitamin (MULTIVITAMIN WITH MINERALS) TABS tablet Take 1 tablet by mouth daily.    [provider]  PARoxetine (PAXIL) 20 MG tablet Take 20 mg by mouth daily.    [provider]  simvastatin (ZOCOR) 40 MG tablet Take 40 mg by mouth daily.    [provider]  zolpidem (AMBIEN) 5 MG tablet Take 1 tablet (5 mg total) by mouth at bedtime as needed for sleep. 09/23/16   Cristal Ford, DO    Family History Family History  Problem Relation Age of Onset  . Hypertension Mother   . Heart disease Mother        No details.  Died at age 39  . Diabetes Father   . Heart attack Brother 54    Social History Social History   Tobacco Use  . Smoking status: Never Smoker  . Smokeless tobacco: Never Used  Substance Use Topics  . Alcohol use: No  . Drug use: No     Allergies   Sulfa antibiotics   Review of Systems Review of Systems  Constitutional: Negative for fever.  Respiratory: Negative for shortness of breath.   Cardiovascular: Negative for chest pain.  Gastrointestinal: Negative for vomiting.  Musculoskeletal: Positive for neck pain (chronic).  Neurological: Negative for weakness, numbness and headaches.  All other systems reviewed and are negative.    Physical Exam Updated Vital Signs BP (!) 162/61   Pulse 78   Temp 98.7 F (37.1 C) (Oral)   Resp 14   Ht 5\' 3"  (1.6 m)   Wt 59 kg (130 lb)   SpO2 98%   BMI 23.03 kg/m   Physical Exam  Constitutional: She is oriented to person, place, and time. She appears well-developed and well-nourished. No distress.  HENT:  Head: Normocephalic. Head is with contusion.    Right Ear: External ear normal.  Left Ear: External ear normal.  Nose: Nose normal.  Eyes: EOM are normal. Pupils are equal, round, and reactive to light. Right eye exhibits no discharge. Left eye exhibits no discharge.  Neck: Neck supple.  Cardiovascular: Normal rate and normal heart sounds. An irregular rhythm present.    Pulmonary/Chest: Effort normal and breath sounds normal.  Abdominal: Soft. There is no tenderness.  Musculoskeletal:       Right hip: She exhibits normal range of motion and no tenderness.       Left hip: She exhibits normal range of motion and no tenderness.  Neurological: She is alert and oriented to person, place, and time.  CN 3-12 grossly intact. 5/5 strength in all 4 extremities. Grossly normal sensation. Normal finger to nose in right arm, left limited  due to chronic decreased mobility in left arm  Skin: Skin is warm and dry. She is not diaphoretic.  Nursing note and vitals reviewed.    ED Treatments / Results  Labs (all labs ordered are listed, but only abnormal results are displayed) Labs Reviewed  CBG MONITORING, ED - Abnormal; Notable for the following components:      Result Value   Glucose-Capillary 153 (*)    All other components within normal limits    EKG  EKG Interpretation  Date/Time:  Wednesday February 03 2018 07:30:26 EST Ventricular Rate:  69 PR Interval:    QRS Duration: 97 QT Interval:  441 QTC Calculation: 473 R Axis:   4 Text Interpretation:  Atrial fibrillation Nonspecific T abnormalities, lateral leads no significant change since Oct 2017 Confirmed by Sherwood Gambler [phone]) on 02/03/2018 7:47:44 AM Also confirmed by Sherwood Gambler [phone]), editor Radene Gunning [phone])  on 02/03/2018 7:50:15 AM       Radiology Ct Head Wo Contrast  Result Date: 02/03/2018 CLINICAL DATA:  84 year old female status post fall out of bed. Forehead hematoma. On anti coagulation. EXAM: CT HEAD WITHOUT CONTRAST TECHNIQUE: Contiguous axial images were obtained from the base of the skull through the vertex without intravenous contrast. COMPARISON:  Head and cervical spine CT 12/06/2017 and earlier. FINDINGS: Brain: No midline shift, ventriculomegaly, mass effect, evidence of mass lesion, intracranial hemorrhage or evidence of cortically based acute infarction. Gray-white matter  differentiation is within normal limits throughout the brain. Stable cerebral volume. No cortical encephalomalacia identified. Vascular: Calcified atherosclerosis at the skull base. No suspicious intracranial vascular hyperdensity. Skull: Stable. No skull fracture identified. Left nasal bone fracture re-demonstrated. Sinuses/Orbits: Visualized paranasal sinuses and mastoids are stable and well pneumatized. Other: Regression of the left periorbital and premalar space hematoma since 12/06/2017 (small residual on series 3, image 1). New left forehead scalp hematoma measuring up to 8 millimeters in thickness. Underlying left frontal bone is intact. Other scalp soft tissues remain within normal limits. Bilateral orbits soft tissues appear stable and within normal limits. IMPRESSION: 1. Left forehead scalp hematoma without underlying fracture. 2. Stable and negative for age noncontrast CT appearance of the brain. 3. Regressed but not completely resolved left face hematoma since 12/06/2017. Electronically Signed   By: Genevie Ann M.D.   On: 02/03/2018 08:52    Procedures Procedures (including critical care time)  Medications Ordered in ED Medications - No data to display   Initial Impression / Assessment and Plan / ED Course  I have reviewed the triage vital signs and the nursing notes.  Pertinent labs & imaging results that were available during my care of the patient were reviewed by me and considered in my medical decision making (see chart for details).     Patient's fall is likely from her chronic neuropathy.  CT head obtained given the ecchymosis.  The CT is negative.  She otherwise does not have any acute injury via signs or symptoms.  She is able to ambulate without significant assistance.  She is bearing weight and so my suspicion of a hip injury is quite low.  At this point she appears stable for discharge home.  She is not on anticoagulation due to multiple falls which is the appropriate treatment  for her.  Return precautions.  CHA2DS2/VAS Stroke Risk Points      7 >= 2 Points: High Risk  1 - 1.99 Points: Medium Risk  0 Points: Low Risk    This is the only CHA2DS2/VAS Stroke  Risk Points available for the past  year.:  Change: N/A     Details    This score determines the patient's risk of having a stroke if the  patient has atrial fibrillation.       Points Metrics  1 Has Congestive Heart Failure:  Yes   1 Has Vascular Disease:  Yes   1 Has Hypertension:  Yes   2 Age:  74   1 Has Diabetes:  Yes   0 Had Stroke:  No  Had TIA:  No  Had thromboembolism:  No   1 Female:  Yes             Final Clinical Impressions(s) / ED Diagnoses   Final diagnoses:  Fall, initial encounter  Traumatic hematoma of forehead, initial encounter    ED Discharge Orders    None       Sherwood Gambler, MD 02/03/18 [phone]

## 2018-02-03 NOTE — ED Notes (Signed)
Patient ambulated down hallway with walker, minimal assistance and steady gait.

## 2018-02-03 NOTE — ED Triage Notes (Signed)
Per EMS, pt from [facility]. Mechanical fall from bed. Hematoma on left forehead, no complaints of pain, denies anticoags but she does have hx of Afib.

## 2018-02-08 DIAGNOSIS — R2681 Unsteadiness on feet: Secondary | ICD-10-CM | POA: Diagnosis not present

## 2018-02-08 DIAGNOSIS — M25641 Stiffness of right hand, not elsewhere classified: Secondary | ICD-10-CM | POA: Diagnosis not present

## 2018-02-08 DIAGNOSIS — R278 Other lack of coordination: Secondary | ICD-10-CM | POA: Diagnosis not present

## 2018-02-08 DIAGNOSIS — R296 Repeated falls: Secondary | ICD-10-CM | POA: Diagnosis not present

## 2018-02-09 DIAGNOSIS — R278 Other lack of coordination: Secondary | ICD-10-CM | POA: Diagnosis not present

## 2018-02-09 DIAGNOSIS — R2681 Unsteadiness on feet: Secondary | ICD-10-CM | POA: Diagnosis not present

## 2018-02-09 DIAGNOSIS — R296 Repeated falls: Secondary | ICD-10-CM | POA: Diagnosis not present

## 2018-02-09 DIAGNOSIS — M25641 Stiffness of right hand, not elsewhere classified: Secondary | ICD-10-CM | POA: Diagnosis not present

## 2018-02-15 DIAGNOSIS — R278 Other lack of coordination: Secondary | ICD-10-CM | POA: Diagnosis not present

## 2018-02-15 DIAGNOSIS — F330 Major depressive disorder, recurrent, mild: Secondary | ICD-10-CM | POA: Diagnosis not present

## 2018-02-15 DIAGNOSIS — M25641 Stiffness of right hand, not elsewhere classified: Secondary | ICD-10-CM | POA: Diagnosis not present

## 2018-02-15 DIAGNOSIS — R2681 Unsteadiness on feet: Secondary | ICD-10-CM | POA: Diagnosis not present

## 2018-02-15 DIAGNOSIS — R296 Repeated falls: Secondary | ICD-10-CM | POA: Diagnosis not present

## 2018-02-17 DIAGNOSIS — R2681 Unsteadiness on feet: Secondary | ICD-10-CM | POA: Diagnosis not present

## 2018-02-17 DIAGNOSIS — R296 Repeated falls: Secondary | ICD-10-CM | POA: Diagnosis not present

## 2018-02-17 DIAGNOSIS — M25641 Stiffness of right hand, not elsewhere classified: Secondary | ICD-10-CM | POA: Diagnosis not present

## 2018-02-17 DIAGNOSIS — R278 Other lack of coordination: Secondary | ICD-10-CM | POA: Diagnosis not present

## 2018-02-19 DIAGNOSIS — R278 Other lack of coordination: Secondary | ICD-10-CM | POA: Diagnosis not present

## 2018-02-19 DIAGNOSIS — R296 Repeated falls: Secondary | ICD-10-CM | POA: Diagnosis not present

## 2018-02-19 DIAGNOSIS — M25641 Stiffness of right hand, not elsewhere classified: Secondary | ICD-10-CM | POA: Diagnosis not present

## 2018-02-19 DIAGNOSIS — R2681 Unsteadiness on feet: Secondary | ICD-10-CM | POA: Diagnosis not present

## 2018-03-15 DIAGNOSIS — F330 Major depressive disorder, recurrent, mild: Secondary | ICD-10-CM | POA: Diagnosis not present

## 2018-03-16 DIAGNOSIS — R2681 Unsteadiness on feet: Secondary | ICD-10-CM | POA: Diagnosis not present

## 2018-03-16 DIAGNOSIS — R278 Other lack of coordination: Secondary | ICD-10-CM | POA: Diagnosis not present

## 2018-03-17 DIAGNOSIS — R2681 Unsteadiness on feet: Secondary | ICD-10-CM | POA: Diagnosis not present

## 2018-03-17 DIAGNOSIS — R278 Other lack of coordination: Secondary | ICD-10-CM | POA: Diagnosis not present

## 2018-03-18 DIAGNOSIS — R2681 Unsteadiness on feet: Secondary | ICD-10-CM | POA: Diagnosis not present

## 2018-03-18 DIAGNOSIS — R278 Other lack of coordination: Secondary | ICD-10-CM | POA: Diagnosis not present

## 2018-03-24 DIAGNOSIS — R278 Other lack of coordination: Secondary | ICD-10-CM | POA: Diagnosis not present

## 2018-03-24 DIAGNOSIS — R2681 Unsteadiness on feet: Secondary | ICD-10-CM | POA: Diagnosis not present

## 2018-03-25 DIAGNOSIS — R278 Other lack of coordination: Secondary | ICD-10-CM | POA: Diagnosis not present

## 2018-03-25 DIAGNOSIS — R2681 Unsteadiness on feet: Secondary | ICD-10-CM | POA: Diagnosis not present

## 2018-03-26 DIAGNOSIS — R2681 Unsteadiness on feet: Secondary | ICD-10-CM | POA: Diagnosis not present

## 2018-03-26 DIAGNOSIS — R278 Other lack of coordination: Secondary | ICD-10-CM | POA: Diagnosis not present

## 2018-03-31 DIAGNOSIS — R2681 Unsteadiness on feet: Secondary | ICD-10-CM | POA: Diagnosis not present

## 2018-03-31 DIAGNOSIS — R278 Other lack of coordination: Secondary | ICD-10-CM | POA: Diagnosis not present

## 2018-04-01 DIAGNOSIS — R2681 Unsteadiness on feet: Secondary | ICD-10-CM | POA: Diagnosis not present

## 2018-04-01 DIAGNOSIS — R278 Other lack of coordination: Secondary | ICD-10-CM | POA: Diagnosis not present

## 2018-04-02 DIAGNOSIS — R278 Other lack of coordination: Secondary | ICD-10-CM | POA: Diagnosis not present

## 2018-04-02 DIAGNOSIS — R2681 Unsteadiness on feet: Secondary | ICD-10-CM | POA: Diagnosis not present

## 2018-04-12 DIAGNOSIS — F419 Anxiety disorder, unspecified: Secondary | ICD-10-CM | POA: Diagnosis not present

## 2018-04-12 DIAGNOSIS — F330 Major depressive disorder, recurrent, mild: Secondary | ICD-10-CM | POA: Diagnosis not present

## 2018-04-20 DIAGNOSIS — Z1389 Encounter for screening for other disorder: Secondary | ICD-10-CM | POA: Diagnosis not present

## 2018-04-20 DIAGNOSIS — Z0000 Encounter for general adult medical examination without abnormal findings: Secondary | ICD-10-CM | POA: Diagnosis not present

## 2018-04-20 DIAGNOSIS — I129 Hypertensive chronic kidney disease with stage 1 through stage 4 chronic kidney disease, or unspecified chronic kidney disease: Secondary | ICD-10-CM | POA: Diagnosis not present

## 2018-04-20 DIAGNOSIS — F325 Major depressive disorder, single episode, in full remission: Secondary | ICD-10-CM | POA: Diagnosis not present

## 2018-04-20 DIAGNOSIS — Z79899 Other long term (current) drug therapy: Secondary | ICD-10-CM | POA: Diagnosis not present

## 2018-04-20 DIAGNOSIS — I482 Chronic atrial fibrillation: Secondary | ICD-10-CM | POA: Diagnosis not present

## 2018-04-20 DIAGNOSIS — E1121 Type 2 diabetes mellitus with diabetic nephropathy: Secondary | ICD-10-CM | POA: Diagnosis not present

## 2018-04-20 DIAGNOSIS — Z7984 Long term (current) use of oral hypoglycemic drugs: Secondary | ICD-10-CM | POA: Diagnosis not present

## 2018-04-20 DIAGNOSIS — E1140 Type 2 diabetes mellitus with diabetic neuropathy, unspecified: Secondary | ICD-10-CM | POA: Diagnosis not present

## 2018-04-20 DIAGNOSIS — N183 Chronic kidney disease, stage 3 (moderate): Secondary | ICD-10-CM | POA: Diagnosis not present

## 2018-04-20 DIAGNOSIS — R0609 Other forms of dyspnea: Secondary | ICD-10-CM | POA: Diagnosis not present

## 2018-05-02 NOTE — Progress Notes (Signed)
Cardiology Office Note   Date:  0/07/6760   ID:  Jessica King, DOB [birth year], MRN [id number]  PCP:  Lajean Manes, MD  Cardiologist:   Minus Breeding, MD   Chief Complaint  Patient presents with  . Shortness of Breath  . Edema      History of Present Illness: Jessica King is a 84 y.o. female who presents for evaluation of known coronary disease, atrial fib and syncope. She has moved from [place].  Since I last saw her she had trigger finger release.  She presents for follow-up and has had 6 months of increasing shortness of breath with activity such as walking.  She might get some mild chest pressure with this but this is not a predominant symptom.  She does not describe resting shortness of breath, PND or orthopnea.  She does not notice her atrial fibrillation.  She is had no presyncope or syncope.  She has been very fatigued.  She sleeps very poorly at night however and sleeps during the day.  She is been complaining of cold feet for about 6 to 8 weeks.  She has not had any weight gain.  Has not had any new swelling.   Past Medical History:  Diagnosis Date  . Abdominal fibromatosis   . Allergic rhinitis   . CAD (coronary artery disease)   . Chronic systolic congestive heart failure ([place])   . Coronary artery disease    Cardiac cath 1999, 2007, 2010, Taxus stent x 2 to LAD 2007.   . Diabetes mellitus without complication ([place])   . Diverticulosis   . Hyperlipidemia   . Hypertension   . Iron deficiency anemia   . Peripheral neuropathy    Frequent fall  . PVD (peripheral vascular disease) ([place])    Subclavian stent May 2013 (subclavian steal)    Past Surgical History:  Procedure Laterality Date  . BACK SURGERY     x 3 or 4 lumbar, cervical x 2  . CEA    . CORONARY ARTERY BYPASS GRAFT  2011   x 3   . Coronary Stents x3       Current Outpatient Medications  Medication Sig Dispense Refill  . aspirin EC 81 MG tablet Take 1 tablet (81 mg total) by mouth daily. 30  tablet 11  . Ca Carbonate-Mag Hydroxide (ROLAIDS) 550-110 MG CHEW Chew 1 tablet by mouth as needed (for heart burn).    . clonazePAM (KLONOPIN) 0.5 MG tablet Take 0.5 mg by mouth 2 (two) times daily. Takes 1 tab in daytime as needed and 1 tab every bedtime    . CRANBERRY PO Take 4,200 mg by mouth daily.    Marland Kitchen gabapentin (NEURONTIN) 300 MG capsule Take 300-600 mg by mouth See admin instructions. Take 1 capsule (300mg ) three times daily at 0800, 1200, and 1700 and then take 2 capsules (600mg ) at bedtime    . HYDROcodone-acetaminophen (NORCO) 7.5-325 MG per tablet Take 1 tablet by mouth 3 (three) times daily. At 0800, 1300, and 2000    . hydroxypropyl methylcellulose / hypromellose (ISOPTO TEARS / GONIOVISC) 2.5 % ophthalmic solution Place 1 drop into both eyes 3 (three) times daily as needed for dry eyes.    Marland Kitchen lisinopril (PRINIVIL,ZESTRIL) 5 MG tablet Take 1 tablet (5 mg total) by mouth daily. 30 tablet 0  . loratadine (CLARITIN) 10 MG tablet Take 10 mg by mouth daily as needed for allergies.     . Menthol, Topical Analgesic, (BIOFREEZE) 4 % GEL Apply 1  application topically 4 (four) times daily as needed (for shoulder and/or neck pain).    . metFORMIN (GLUCOPHAGE) 1000 MG tablet Take 1,000 mg by mouth 2 (two) times daily with a meal.    . methocarbamol (ROBAXIN) 750 MG tablet Take 750 mg by mouth every 6 (six) hours as needed for muscle spasms.     . Multiple Vitamin (MULTIVITAMIN WITH MINERALS) TABS tablet Take 1 tablet by mouth daily.    Marland Kitchen PARoxetine (PAXIL) 30 MG tablet Take 30 mg by mouth daily.     . polyethylene glycol (MIRALAX / GLYCOLAX) packet Take 17 g by mouth daily as needed for mild constipation.    . psyllium (METAMUCIL) 58.6 % powder Take 1 packet by mouth daily.    . simvastatin (ZOCOR) 40 MG tablet Take 40 mg by mouth every evening.     . traMADol (ULTRAM) 50 MG tablet Take 50 mg by mouth every 8 (eight) hours as needed for moderate pain.    Marland Kitchen zolpidem (AMBIEN) 5 MG tablet Take 1  tablet (5 mg total) by mouth at bedtime as needed for sleep. (Patient taking differently: Take 5 mg by mouth at bedtime. ) 30 tablet 0   No current facility-administered medications for this visit.     Allergies:   Sulfa antibiotics    ROS:  Please see the history of present illness.   Otherwise, review of systems are positive for none.   All other systems are reviewed and negative.    PHYSICAL EXAM: VS:  BP 120/70 (BP Location: Left Arm, Patient Position: Sitting, Cuff Size: Normal)   Pulse 88   Ht 5\' 3"  (1.6 m)   Wt 156 lb (70.8 kg)   BMI 27.63 kg/m  , BMI Body mass index is 27.63 kg/m.  GENERAL:  Frail appearing NECK:  No jugular venous distention, waveform within normal limits, carotid upstroke brisk and symmetric, no bruits, no thyromegaly LUNGS:  Clear to auscultation bilaterally CHEST:  Unremarkable HEART:  PMI not displaced or sustained,S1 and S2 within normal limits, no S3,  no clicks, no rubs, irregular murmurs ABD:  Flat, positive bowel sounds normal in frequency in pitch, no bruits, no rebound, no guarding, no midline pulsatile mass, no hepatomegaly, no splenomegaly EXT:  2 plus pulses throughout, no edema, no cyanosis no clubbing    EKG:  EKG is not ordered today.    Recent Labs: No results found for requested labs within last 8760 hours.    Lipid Panel No results found for: CHOL, TRIG, HDL, CHOLHDL, VLDL, LDLCALC, LDLDIRECT    Wt Readings from Last 3 Encounters:  05/04/18 156 lb (70.8 kg)  02/03/18 130 lb (59 kg)  12/06/17 149 lb (67.6 kg)      Other studies Reviewed: Additional studies/ records that were reviewed today include: Labs  Review of the above records demonstrates:      ASSESSMENT AND PLAN:   CAD:   At this point I do not know that she has any anginal symptoms.  I might consider screening perfusion study if I do not find another etiology for her dyspnea with exertion.   BRUIT:  She had mild plaque on Doppler in June 2016 .  No further  imaging at this point.   HTN:  Her blood pressure is at target.  No change in therapy.   DM:   I will defer toStoneking, Hal, MD  A1C was 7.2.  ATRIAL FIB:  [name] has a [id number] - VASc score of 7.  She has been a high risk fall candidate and I have discussed the risk benefits of anticoagulation with her daughter before.  That she is not to use anticoagulation.  I did clear above misconception that the aspirin is not preventing stroke in the situation.  DYSLIPIDEMIA:     Given her significant vascular history I will suggest a goal LDL less than 70 and I will again defer to Lajean Manes, MD   DOE:  This is her biggest complaint.   I will check a BNP and an echo.  However, when we walked her around the office she did not drop her oxygen sats while ambulating with her walker.  I will consider stress testing as above.  FATIGUE:  This is her biggest complaint.  It could be related to meds like Neurontin.  However, the first thing that would need to be addressed would be her poor sleeping habits and napping during the day.  Her TSH was OK last year.  She does have anemia and this is being addressed.     Current medicines are reviewed at length with the patient today.  The patient does not have concerns regarding medicines.  The following changes have been made:  None  Labs/ tests ordered today include:    Orders Placed This Encounter  Procedures  . B Nat Peptide  . ECHOCARDIOGRAM COMPLETE     Disposition:   FU with me in 2 months.    Signed, Minus Breeding, MD  05/04/2018 12:11 PM    [facility]

## 2018-05-04 ENCOUNTER — Ambulatory Visit (INDEPENDENT_AMBULATORY_CARE_PROVIDER_SITE_OTHER): Payer: Medicare Other | Admitting: Cardiology

## 2018-05-04 ENCOUNTER — Encounter: Payer: Self-pay | Admitting: Cardiology

## 2018-05-04 VITALS — BP 120/70 | HR 88 | Ht 63.0 in | Wt 156.0 lb

## 2018-05-04 DIAGNOSIS — E785 Hyperlipidemia, unspecified: Secondary | ICD-10-CM | POA: Diagnosis not present

## 2018-05-04 DIAGNOSIS — R0609 Other forms of dyspnea: Secondary | ICD-10-CM | POA: Diagnosis not present

## 2018-05-04 DIAGNOSIS — R5383 Other fatigue: Secondary | ICD-10-CM | POA: Insufficient documentation

## 2018-05-04 DIAGNOSIS — I4821 Permanent atrial fibrillation: Secondary | ICD-10-CM

## 2018-05-04 DIAGNOSIS — I482 Chronic atrial fibrillation: Secondary | ICD-10-CM

## 2018-05-04 DIAGNOSIS — R0600 Dyspnea, unspecified: Secondary | ICD-10-CM | POA: Diagnosis not present

## 2018-05-04 DIAGNOSIS — I10 Essential (primary) hypertension: Secondary | ICD-10-CM

## 2018-05-04 NOTE — Patient Instructions (Signed)
Medication Instructions:  Continue current medications  If you need a refill on your cardiac medications before your next appointment, please call your pharmacy.  Labwork: BNP Today HERE IN OUR OFFICE AT [facility]  Take the provided lab slips with you to the lab for your blood draw.   You will NOT need to fast   Testing/Procedures: Your physician has requested that you have an echocardiogram. Echocardiography is a painless test that uses sound waves to create images of your heart. It provides your doctor with information about the size and shape of your heart and how well your heart's chambers and valves are working. This procedure takes approximately one hour. There are no restrictions for this procedure.  Follow-Up: Your physician wants you to follow-up in: 2 Months.      Thank you for choosing [facility] at [facility]!!

## 2018-05-05 LAB — BRAIN NATRIURETIC PEPTIDE: BNP: 222.5 pg/mL — AB (ref 0.0–100.0)

## 2018-05-06 ENCOUNTER — Ambulatory Visit (HOSPITAL_COMMUNITY): Payer: Medicare Other | Attending: Cardiology

## 2018-05-06 ENCOUNTER — Other Ambulatory Visit: Payer: Self-pay

## 2018-05-06 DIAGNOSIS — I509 Heart failure, unspecified: Secondary | ICD-10-CM | POA: Diagnosis not present

## 2018-05-06 DIAGNOSIS — I2510 Atherosclerotic heart disease of native coronary artery without angina pectoris: Secondary | ICD-10-CM | POA: Diagnosis not present

## 2018-05-06 DIAGNOSIS — E785 Hyperlipidemia, unspecified: Secondary | ICD-10-CM | POA: Insufficient documentation

## 2018-05-06 DIAGNOSIS — I4821 Permanent atrial fibrillation: Secondary | ICD-10-CM

## 2018-05-06 DIAGNOSIS — I110 Hypertensive heart disease with heart failure: Secondary | ICD-10-CM | POA: Diagnosis not present

## 2018-05-06 DIAGNOSIS — I482 Chronic atrial fibrillation: Secondary | ICD-10-CM

## 2018-05-06 DIAGNOSIS — E119 Type 2 diabetes mellitus without complications: Secondary | ICD-10-CM | POA: Diagnosis not present

## 2018-05-06 DIAGNOSIS — Z8249 Family history of ischemic heart disease and other diseases of the circulatory system: Secondary | ICD-10-CM | POA: Insufficient documentation

## 2018-05-06 DIAGNOSIS — R0600 Dyspnea, unspecified: Secondary | ICD-10-CM | POA: Diagnosis present

## 2018-05-06 DIAGNOSIS — I081 Rheumatic disorders of both mitral and tricuspid valves: Secondary | ICD-10-CM | POA: Diagnosis not present

## 2018-05-06 DIAGNOSIS — Z951 Presence of aortocoronary bypass graft: Secondary | ICD-10-CM | POA: Insufficient documentation

## 2018-05-10 ENCOUNTER — Telehealth: Payer: Self-pay | Admitting: *Deleted

## 2018-05-10 DIAGNOSIS — Z79899 Other long term (current) drug therapy: Secondary | ICD-10-CM

## 2018-05-10 DIAGNOSIS — F330 Major depressive disorder, recurrent, mild: Secondary | ICD-10-CM | POA: Diagnosis not present

## 2018-05-10 DIAGNOSIS — F419 Anxiety disorder, unspecified: Secondary | ICD-10-CM | POA: Diagnosis not present

## 2018-05-10 NOTE — Telephone Encounter (Signed)
Spoke with nurse at [facility] to give result and medication changes and order for BMP, nurse asked if we can faxed order to them at ([phone]. Pt daughter also made aware of pt result of lab and Echo

## 2018-05-10 NOTE — Telephone Encounter (Signed)
-----   Message from Minus Breeding, MD sent at 05/09/2018  6:13 PM EDT ----- EF is slightly low but unchanged from previous.  I don't see that she is on a diuretic.   I will prescribe 20 mg Lasix daily.  Check a BMET in 10 days.  Disp number 30 with 11 refills.  Send results to Lajean Manes, MD

## 2018-05-12 DIAGNOSIS — M545 Low back pain: Secondary | ICD-10-CM | POA: Diagnosis not present

## 2018-05-12 DIAGNOSIS — M25551 Pain in right hip: Secondary | ICD-10-CM | POA: Diagnosis not present

## 2018-05-13 ENCOUNTER — Telehealth: Payer: Self-pay | Admitting: *Deleted

## 2018-05-13 ENCOUNTER — Telehealth: Payer: Self-pay | Admitting: Cardiology

## 2018-05-13 MED ORDER — FUROSEMIDE 20 MG PO TABS: 20.0000 mg | ORAL_TABLET | Freq: Every day | ORAL | 11 refills | Status: AC

## 2018-05-13 NOTE — Telephone Encounter (Signed)
Received records from [facility] on 05/13/18, Appt 07/06/18 @ 2:40PM. [place]

## 2018-05-13 NOTE — Telephone Encounter (Signed)
Jessica King from [facility] is calling concerning previous message about Lasix because they never received the prescription for it and she stated their pharmacy is closed on the weekend. Please call to advise.

## 2018-05-17 NOTE — Telephone Encounter (Signed)
Prescription sign and fax to [facility].

## 2018-05-25 DIAGNOSIS — M1711 Unilateral primary osteoarthritis, right knee: Secondary | ICD-10-CM | POA: Diagnosis not present

## 2018-05-25 DIAGNOSIS — M25562 Pain in left knee: Secondary | ICD-10-CM | POA: Diagnosis not present

## 2018-05-25 DIAGNOSIS — M1712 Unilateral primary osteoarthritis, left knee: Secondary | ICD-10-CM | POA: Diagnosis not present

## 2018-05-25 DIAGNOSIS — M25561 Pain in right knee: Secondary | ICD-10-CM | POA: Diagnosis not present

## 2018-06-03 DIAGNOSIS — F330 Major depressive disorder, recurrent, mild: Secondary | ICD-10-CM | POA: Diagnosis not present

## 2018-06-03 DIAGNOSIS — F064 Anxiety disorder due to known physiological condition: Secondary | ICD-10-CM | POA: Diagnosis not present

## 2018-06-03 DIAGNOSIS — F5105 Insomnia due to other mental disorder: Secondary | ICD-10-CM | POA: Diagnosis not present

## 2018-06-03 DIAGNOSIS — R7612 Nonspecific reaction to cell mediated immunity measurement of gamma interferon antigen response without active tuberculosis: Secondary | ICD-10-CM | POA: Diagnosis not present

## 2018-06-08 DIAGNOSIS — M1611 Unilateral primary osteoarthritis, right hip: Secondary | ICD-10-CM | POA: Diagnosis not present

## 2018-06-08 DIAGNOSIS — M1612 Unilateral primary osteoarthritis, left hip: Secondary | ICD-10-CM | POA: Diagnosis not present

## 2018-07-01 DIAGNOSIS — F064 Anxiety disorder due to known physiological condition: Secondary | ICD-10-CM | POA: Diagnosis not present

## 2018-07-01 DIAGNOSIS — F5105 Insomnia due to other mental disorder: Secondary | ICD-10-CM | POA: Diagnosis not present

## 2018-07-01 DIAGNOSIS — F330 Major depressive disorder, recurrent, mild: Secondary | ICD-10-CM | POA: Diagnosis not present

## 2018-07-04 NOTE — Progress Notes (Signed)
Cardiology Office Note   Date:  07/05/1659   ID:  Nely Dedmon, DOB [birth year], MRN [id number]  PCP:  Lajean Manes, MD  Cardiologist:   Minus Breeding, MD   Chief Complaint  Patient presents with  . Coronary Artery Disease      History of Present Illness: Jessica King is a 84 y.o. female who presents for evaluation of known coronary disease, atrial fib and syncope. She has moved from [place].   I sent her for an echocardiogram and the EF was mildly reduced at 45 - 50%.  Her BNP was very mildly elevated.  She was started on Lasix.   Since I last saw her she has felt about the same.  She lives in a nursing home and she does walk down the hallways but she gets short of breath with this.  She says she did not make note that there was much of a difference with the diuretic that was started.  She does not describe any PND or orthopnea.  She notices palpitations rarely.  She continues to have fatigue.  She is not having any new chest pressure, neck or arm discomfort.  She has had no weight gain and has some mild lower extremity swelling that is improved when she keeps her feet up and wears compression stockings.  Past Medical History:  Diagnosis Date  . Abdominal fibromatosis   . Allergic rhinitis   . CAD (coronary artery disease)   . Chronic systolic congestive heart failure ([place])   . Coronary artery disease    Cardiac cath 1999, 2007, 2010, Taxus stent x 2 to LAD 2007.   . Diabetes mellitus without complication ([place])   . Diverticulosis   . Hyperlipidemia   . Hypertension   . Iron deficiency anemia   . Peripheral neuropathy    Frequent fall  . PVD (peripheral vascular disease) ([place])    Subclavian stent May 2013 (subclavian steal)    Past Surgical History:  Procedure Laterality Date  . BACK SURGERY     x 3 or 4 lumbar, cervical x 2  . CEA    . CORONARY ARTERY BYPASS GRAFT  2011   x 3   . Coronary Stents x3       Current Outpatient Medications  Medication Sig Dispense  Refill  . aspirin EC 81 MG tablet Take 1 tablet (81 mg total) by mouth daily. 30 tablet 11  . Ca Carbonate-Mag Hydroxide (ROLAIDS) 550-110 MG CHEW Chew 1 tablet by mouth as needed (for heart burn).    . clonazePAM (KLONOPIN) 0.5 MG tablet Take 0.5 mg by mouth 2 (two) times daily. Takes 1 tab in daytime as needed and 1 tab every bedtime    . CRANBERRY PO Take 4,200 mg by mouth daily.    . furosemide (LASIX) 20 MG tablet Take 1 tablet (20 mg total) by mouth daily. 30 tablet 11  . gabapentin (NEURONTIN) 300 MG capsule Take 300-600 mg by mouth See admin instructions. Take 1 capsule (300mg ) three times daily at 0800, 1200, and 1700 and then take 2 capsules (600mg ) at bedtime    . HYDROcodone-acetaminophen (NORCO) 7.5-325 MG per tablet Take 1 tablet by mouth 3 (three) times daily. At 0800, 1300, and 2000    . hydroxypropyl methylcellulose / hypromellose (ISOPTO TEARS / GONIOVISC) 2.5 % ophthalmic solution Place 1 drop into both eyes 3 (three) times daily as needed for dry eyes.    Marland Kitchen lisinopril (PRINIVIL,ZESTRIL) 5 MG tablet Take 1 tablet (5 mg  total) by mouth daily. 30 tablet 0  . loratadine (CLARITIN) 10 MG tablet Take 10 mg by mouth daily as needed for allergies.     . Menthol, Topical Analgesic, (BIOFREEZE) 4 % GEL Apply 1 application topically 4 (four) times daily as needed (for shoulder and/or neck pain).    . metFORMIN (GLUCOPHAGE) 1000 MG tablet Take 1,000 mg by mouth 2 (two) times daily with a meal.    . methocarbamol (ROBAXIN) 750 MG tablet Take 750 mg by mouth every 6 (six) hours as needed for muscle spasms.     . Multiple Vitamin (MULTIVITAMIN WITH MINERALS) TABS tablet Take 1 tablet by mouth daily.    Marland Kitchen PARoxetine (PAXIL) 30 MG tablet Take 30 mg by mouth daily.     . polyethylene glycol (MIRALAX / GLYCOLAX) packet Take 17 g by mouth daily as needed for mild constipation.    . psyllium (METAMUCIL) 58.6 % powder Take 1 packet by mouth daily.    . simvastatin (ZOCOR) 40 MG tablet Take 40 mg by  mouth every evening.     . traMADol (ULTRAM) 50 MG tablet Take 50 mg by mouth every 8 (eight) hours as needed for moderate pain.    Marland Kitchen zolpidem (AMBIEN) 5 MG tablet Take 1 tablet (5 mg total) by mouth at bedtime as needed for sleep. (Patient taking differently: Take 5 mg by mouth at bedtime. ) 30 tablet 0   No current facility-administered medications for this visit.     Allergies:   Sulfa antibiotics    ROS:  Please see the history of present illness.   Otherwise, review of systems are positive for remor.   All other systems are reviewed and negative.    PHYSICAL EXAM: VS:  BP (!) 144/77   Pulse 86   Ht 5\' 3"  (1.6 m)   Wt 157 lb (71.2 kg)   SpO2 93%   BMI 27.81 kg/m  , BMI Body mass index is 27.81 kg/m.  GENERAL:  Well appearing NECK:  No jugular venous distention, waveform within normal limits, carotid upstroke brisk and symmetric, no bruits, no thyromegaly LUNGS:  Clear to auscultation bilaterally CHEST:  Unremarkable HEART:  PMI not displaced or sustained,S1 and S2 within normal limits, no S3, no clicks, no rubs, no murmurs, irregular ABD:  Flat, positive bowel sounds normal in frequency in pitch, no bruits, no rebound, no guarding, no midline pulsatile mass, no hepatomegaly, no splenomegaly EXT:  2 plus pulses throughout, no edema, no cyanosis no clubbing    EKG:  EKG is not ordered today.    Recent Labs: 05/04/2018: BNP 222.5    Lipid Panel No results found for: CHOL, TRIG, HDL, CHOLHDL, VLDL, LDLCALC, LDLDIRECT    Wt Readings from Last 3 Encounters:  07/06/18 157 lb (71.2 kg)  05/04/18 156 lb (70.8 kg)  02/03/18 130 lb (59 kg)      Other studies Reviewed: Additional studies/ records that were reviewed today include: Labs  Review of the above records demonstrates:      ASSESSMENT AND PLAN:   CAD:      The patient has no new sypmtoms.  No further cardiovascular testing is indicated.  We will continue with aggressive risk reduction and meds as  listed.  BRUIT:    Mild plaque in 2016.  No change in therapy.    HTN:    The blood pressure is at target. No change in medications is indicated. We will continue with therapeutic lifestyle changes (TLC).  DM:  A1C was 7.2 .  I defer to Lajean Manes, MD   ATRIAL FIB:  Jessica King has a QAE4LP5 - VASc score of 7.    She has been a high risk fall candidate and I have discussed the risk benefits of anticoagulation with her daughter before.   She will not be started on warfarin per their choice.   DYSLIPIDEMIA:      I would suggest an LDL less than 70 and I will defer to Lajean Manes, MD   DOE:   BNP was very slightly elevated.   This is managed as above.  Since were okay when she ambulated around the office although she did feel short of breath.  I suspect some component of deconditioning and aging and muscle wasting.  No change in therapy.  I will continue the low-dose Lasix.  FATIGUE:    I do not see an overt cardiac cause for this.  No change in therapy is indicated.   Current medicines are reviewed at length with the patient today.  The patient does not have concerns regarding medicines.  The following changes have been made:  None  Labs/ tests ordered today include:  None No orders of the defined types were placed in this encounter.    Disposition:   FU with me in 12 months.    Signed, Minus Breeding, MD  07/06/2018 3:57 PM    [facility]

## 2018-07-06 ENCOUNTER — Encounter: Payer: Self-pay | Admitting: Cardiology

## 2018-07-06 ENCOUNTER — Ambulatory Visit (INDEPENDENT_AMBULATORY_CARE_PROVIDER_SITE_OTHER): Payer: Medicare Other | Admitting: Cardiology

## 2018-07-06 VITALS — BP 144/77 | HR 86 | Ht 63.0 in | Wt 157.0 lb

## 2018-07-06 DIAGNOSIS — E119 Type 2 diabetes mellitus without complications: Secondary | ICD-10-CM

## 2018-07-06 DIAGNOSIS — I2510 Atherosclerotic heart disease of native coronary artery without angina pectoris: Secondary | ICD-10-CM

## 2018-07-06 DIAGNOSIS — R0989 Other specified symptoms and signs involving the circulatory and respiratory systems: Secondary | ICD-10-CM | POA: Diagnosis not present

## 2018-07-06 DIAGNOSIS — E785 Hyperlipidemia, unspecified: Secondary | ICD-10-CM | POA: Diagnosis not present

## 2018-07-06 DIAGNOSIS — I10 Essential (primary) hypertension: Secondary | ICD-10-CM | POA: Diagnosis not present

## 2018-07-06 DIAGNOSIS — R0609 Other forms of dyspnea: Secondary | ICD-10-CM

## 2018-07-06 NOTE — Patient Instructions (Signed)
Medication Instructions:  Your physician recommends that you continue on your current medications as directed. Please refer to the Current Medication list given to you today.  Labwork: none  Testing/Procedures: none  Follow-Up: Your physician wants you to follow-up in: 1 year  You will receive a reminder letter in the mail two months in advance. If you don't receive a letter, please call our office to schedule the follow-up appointment.  If you need a refill on your cardiac medications before your next appointment, please call your pharmacy.  

## 2018-07-16 DIAGNOSIS — I129 Hypertensive chronic kidney disease with stage 1 through stage 4 chronic kidney disease, or unspecified chronic kidney disease: Secondary | ICD-10-CM | POA: Diagnosis not present

## 2018-07-16 DIAGNOSIS — F064 Anxiety disorder due to known physiological condition: Secondary | ICD-10-CM | POA: Diagnosis not present

## 2018-07-16 DIAGNOSIS — R413 Other amnesia: Secondary | ICD-10-CM | POA: Diagnosis not present

## 2018-07-16 DIAGNOSIS — E1140 Type 2 diabetes mellitus with diabetic neuropathy, unspecified: Secondary | ICD-10-CM | POA: Diagnosis not present

## 2018-07-16 DIAGNOSIS — E1122 Type 2 diabetes mellitus with diabetic chronic kidney disease: Secondary | ICD-10-CM | POA: Diagnosis not present

## 2018-07-16 DIAGNOSIS — I2510 Atherosclerotic heart disease of native coronary artery without angina pectoris: Secondary | ICD-10-CM | POA: Diagnosis not present

## 2018-07-16 DIAGNOSIS — M810 Age-related osteoporosis without current pathological fracture: Secondary | ICD-10-CM | POA: Diagnosis not present

## 2018-07-16 DIAGNOSIS — F339 Major depressive disorder, recurrent, unspecified: Secondary | ICD-10-CM | POA: Diagnosis not present

## 2018-07-16 DIAGNOSIS — F5105 Insomnia due to other mental disorder: Secondary | ICD-10-CM | POA: Diagnosis not present

## 2018-07-16 DIAGNOSIS — I482 Chronic atrial fibrillation: Secondary | ICD-10-CM | POA: Diagnosis not present

## 2018-07-16 DIAGNOSIS — K5790 Diverticulosis of intestine, part unspecified, without perforation or abscess without bleeding: Secondary | ICD-10-CM | POA: Diagnosis not present

## 2018-07-16 DIAGNOSIS — N183 Chronic kidney disease, stage 3 (moderate): Secondary | ICD-10-CM | POA: Diagnosis not present

## 2018-07-19 DIAGNOSIS — I129 Hypertensive chronic kidney disease with stage 1 through stage 4 chronic kidney disease, or unspecified chronic kidney disease: Secondary | ICD-10-CM | POA: Diagnosis not present

## 2018-07-19 DIAGNOSIS — K5790 Diverticulosis of intestine, part unspecified, without perforation or abscess without bleeding: Secondary | ICD-10-CM | POA: Diagnosis not present

## 2018-07-19 DIAGNOSIS — N183 Chronic kidney disease, stage 3 (moderate): Secondary | ICD-10-CM | POA: Diagnosis not present

## 2018-07-19 DIAGNOSIS — E1140 Type 2 diabetes mellitus with diabetic neuropathy, unspecified: Secondary | ICD-10-CM | POA: Diagnosis not present

## 2018-07-19 DIAGNOSIS — E1122 Type 2 diabetes mellitus with diabetic chronic kidney disease: Secondary | ICD-10-CM | POA: Diagnosis not present

## 2018-07-19 DIAGNOSIS — I2510 Atherosclerotic heart disease of native coronary artery without angina pectoris: Secondary | ICD-10-CM | POA: Diagnosis not present

## 2018-07-20 DIAGNOSIS — I129 Hypertensive chronic kidney disease with stage 1 through stage 4 chronic kidney disease, or unspecified chronic kidney disease: Secondary | ICD-10-CM | POA: Diagnosis not present

## 2018-07-20 DIAGNOSIS — I2510 Atherosclerotic heart disease of native coronary artery without angina pectoris: Secondary | ICD-10-CM | POA: Diagnosis not present

## 2018-07-20 DIAGNOSIS — N183 Chronic kidney disease, stage 3 (moderate): Secondary | ICD-10-CM | POA: Diagnosis not present

## 2018-07-20 DIAGNOSIS — E1122 Type 2 diabetes mellitus with diabetic chronic kidney disease: Secondary | ICD-10-CM | POA: Diagnosis not present

## 2018-07-20 DIAGNOSIS — E1140 Type 2 diabetes mellitus with diabetic neuropathy, unspecified: Secondary | ICD-10-CM | POA: Diagnosis not present

## 2018-07-20 DIAGNOSIS — K5790 Diverticulosis of intestine, part unspecified, without perforation or abscess without bleeding: Secondary | ICD-10-CM | POA: Diagnosis not present

## 2018-07-21 DIAGNOSIS — E1122 Type 2 diabetes mellitus with diabetic chronic kidney disease: Secondary | ICD-10-CM | POA: Diagnosis not present

## 2018-07-21 DIAGNOSIS — E1140 Type 2 diabetes mellitus with diabetic neuropathy, unspecified: Secondary | ICD-10-CM | POA: Diagnosis not present

## 2018-07-21 DIAGNOSIS — I2510 Atherosclerotic heart disease of native coronary artery without angina pectoris: Secondary | ICD-10-CM | POA: Diagnosis not present

## 2018-07-21 DIAGNOSIS — K5790 Diverticulosis of intestine, part unspecified, without perforation or abscess without bleeding: Secondary | ICD-10-CM | POA: Diagnosis not present

## 2018-07-21 DIAGNOSIS — I129 Hypertensive chronic kidney disease with stage 1 through stage 4 chronic kidney disease, or unspecified chronic kidney disease: Secondary | ICD-10-CM | POA: Diagnosis not present

## 2018-07-21 DIAGNOSIS — N183 Chronic kidney disease, stage 3 (moderate): Secondary | ICD-10-CM | POA: Diagnosis not present

## 2018-07-22 DIAGNOSIS — I2510 Atherosclerotic heart disease of native coronary artery without angina pectoris: Secondary | ICD-10-CM | POA: Diagnosis not present

## 2018-07-22 DIAGNOSIS — I129 Hypertensive chronic kidney disease with stage 1 through stage 4 chronic kidney disease, or unspecified chronic kidney disease: Secondary | ICD-10-CM | POA: Diagnosis not present

## 2018-07-22 DIAGNOSIS — E1140 Type 2 diabetes mellitus with diabetic neuropathy, unspecified: Secondary | ICD-10-CM | POA: Diagnosis not present

## 2018-07-22 DIAGNOSIS — E1122 Type 2 diabetes mellitus with diabetic chronic kidney disease: Secondary | ICD-10-CM | POA: Diagnosis not present

## 2018-07-22 DIAGNOSIS — N183 Chronic kidney disease, stage 3 (moderate): Secondary | ICD-10-CM | POA: Diagnosis not present

## 2018-07-22 DIAGNOSIS — K5790 Diverticulosis of intestine, part unspecified, without perforation or abscess without bleeding: Secondary | ICD-10-CM | POA: Diagnosis not present

## 2018-07-27 DIAGNOSIS — I2510 Atherosclerotic heart disease of native coronary artery without angina pectoris: Secondary | ICD-10-CM | POA: Diagnosis not present

## 2018-07-27 DIAGNOSIS — E1140 Type 2 diabetes mellitus with diabetic neuropathy, unspecified: Secondary | ICD-10-CM | POA: Diagnosis not present

## 2018-07-27 DIAGNOSIS — E1122 Type 2 diabetes mellitus with diabetic chronic kidney disease: Secondary | ICD-10-CM | POA: Diagnosis not present

## 2018-07-27 DIAGNOSIS — N183 Chronic kidney disease, stage 3 (moderate): Secondary | ICD-10-CM | POA: Diagnosis not present

## 2018-07-27 DIAGNOSIS — K5790 Diverticulosis of intestine, part unspecified, without perforation or abscess without bleeding: Secondary | ICD-10-CM | POA: Diagnosis not present

## 2018-07-27 DIAGNOSIS — I129 Hypertensive chronic kidney disease with stage 1 through stage 4 chronic kidney disease, or unspecified chronic kidney disease: Secondary | ICD-10-CM | POA: Diagnosis not present

## 2018-07-28 DIAGNOSIS — E1122 Type 2 diabetes mellitus with diabetic chronic kidney disease: Secondary | ICD-10-CM | POA: Diagnosis not present

## 2018-07-28 DIAGNOSIS — I2510 Atherosclerotic heart disease of native coronary artery without angina pectoris: Secondary | ICD-10-CM | POA: Diagnosis not present

## 2018-07-28 DIAGNOSIS — E1140 Type 2 diabetes mellitus with diabetic neuropathy, unspecified: Secondary | ICD-10-CM | POA: Diagnosis not present

## 2018-07-28 DIAGNOSIS — N183 Chronic kidney disease, stage 3 (moderate): Secondary | ICD-10-CM | POA: Diagnosis not present

## 2018-07-28 DIAGNOSIS — K5790 Diverticulosis of intestine, part unspecified, without perforation or abscess without bleeding: Secondary | ICD-10-CM | POA: Diagnosis not present

## 2018-07-28 DIAGNOSIS — I129 Hypertensive chronic kidney disease with stage 1 through stage 4 chronic kidney disease, or unspecified chronic kidney disease: Secondary | ICD-10-CM | POA: Diagnosis not present

## 2018-07-29 DIAGNOSIS — F064 Anxiety disorder due to known physiological condition: Secondary | ICD-10-CM | POA: Diagnosis not present

## 2018-07-29 DIAGNOSIS — F5105 Insomnia due to other mental disorder: Secondary | ICD-10-CM | POA: Diagnosis not present

## 2018-07-29 DIAGNOSIS — F330 Major depressive disorder, recurrent, mild: Secondary | ICD-10-CM | POA: Diagnosis not present

## 2018-07-31 DIAGNOSIS — F5105 Insomnia due to other mental disorder: Secondary | ICD-10-CM | POA: Diagnosis not present

## 2018-07-31 DIAGNOSIS — F064 Anxiety disorder due to known physiological condition: Secondary | ICD-10-CM | POA: Diagnosis not present

## 2018-07-31 DIAGNOSIS — F330 Major depressive disorder, recurrent, mild: Secondary | ICD-10-CM | POA: Diagnosis not present

## 2018-08-03 DIAGNOSIS — E1122 Type 2 diabetes mellitus with diabetic chronic kidney disease: Secondary | ICD-10-CM | POA: Diagnosis not present

## 2018-08-03 DIAGNOSIS — I2510 Atherosclerotic heart disease of native coronary artery without angina pectoris: Secondary | ICD-10-CM | POA: Diagnosis not present

## 2018-08-03 DIAGNOSIS — K5790 Diverticulosis of intestine, part unspecified, without perforation or abscess without bleeding: Secondary | ICD-10-CM | POA: Diagnosis not present

## 2018-08-03 DIAGNOSIS — N183 Chronic kidney disease, stage 3 (moderate): Secondary | ICD-10-CM | POA: Diagnosis not present

## 2018-08-03 DIAGNOSIS — I129 Hypertensive chronic kidney disease with stage 1 through stage 4 chronic kidney disease, or unspecified chronic kidney disease: Secondary | ICD-10-CM | POA: Diagnosis not present

## 2018-08-03 DIAGNOSIS — E1140 Type 2 diabetes mellitus with diabetic neuropathy, unspecified: Secondary | ICD-10-CM | POA: Diagnosis not present

## 2018-08-04 DIAGNOSIS — E1140 Type 2 diabetes mellitus with diabetic neuropathy, unspecified: Secondary | ICD-10-CM | POA: Diagnosis not present

## 2018-08-04 DIAGNOSIS — I2510 Atherosclerotic heart disease of native coronary artery without angina pectoris: Secondary | ICD-10-CM | POA: Diagnosis not present

## 2018-08-04 DIAGNOSIS — K5790 Diverticulosis of intestine, part unspecified, without perforation or abscess without bleeding: Secondary | ICD-10-CM | POA: Diagnosis not present

## 2018-08-04 DIAGNOSIS — E1122 Type 2 diabetes mellitus with diabetic chronic kidney disease: Secondary | ICD-10-CM | POA: Diagnosis not present

## 2018-08-04 DIAGNOSIS — N183 Chronic kidney disease, stage 3 (moderate): Secondary | ICD-10-CM | POA: Diagnosis not present

## 2018-08-04 DIAGNOSIS — I129 Hypertensive chronic kidney disease with stage 1 through stage 4 chronic kidney disease, or unspecified chronic kidney disease: Secondary | ICD-10-CM | POA: Diagnosis not present

## 2018-08-06 DIAGNOSIS — E1140 Type 2 diabetes mellitus with diabetic neuropathy, unspecified: Secondary | ICD-10-CM | POA: Diagnosis not present

## 2018-08-06 DIAGNOSIS — I2510 Atherosclerotic heart disease of native coronary artery without angina pectoris: Secondary | ICD-10-CM | POA: Diagnosis not present

## 2018-08-06 DIAGNOSIS — I129 Hypertensive chronic kidney disease with stage 1 through stage 4 chronic kidney disease, or unspecified chronic kidney disease: Secondary | ICD-10-CM | POA: Diagnosis not present

## 2018-08-06 DIAGNOSIS — E1122 Type 2 diabetes mellitus with diabetic chronic kidney disease: Secondary | ICD-10-CM | POA: Diagnosis not present

## 2018-08-06 DIAGNOSIS — N183 Chronic kidney disease, stage 3 (moderate): Secondary | ICD-10-CM | POA: Diagnosis not present

## 2018-08-06 DIAGNOSIS — K5790 Diverticulosis of intestine, part unspecified, without perforation or abscess without bleeding: Secondary | ICD-10-CM | POA: Diagnosis not present

## 2018-08-09 DIAGNOSIS — E1140 Type 2 diabetes mellitus with diabetic neuropathy, unspecified: Secondary | ICD-10-CM | POA: Diagnosis not present

## 2018-08-09 DIAGNOSIS — E1122 Type 2 diabetes mellitus with diabetic chronic kidney disease: Secondary | ICD-10-CM | POA: Diagnosis not present

## 2018-08-09 DIAGNOSIS — I129 Hypertensive chronic kidney disease with stage 1 through stage 4 chronic kidney disease, or unspecified chronic kidney disease: Secondary | ICD-10-CM | POA: Diagnosis not present

## 2018-08-09 DIAGNOSIS — I2510 Atherosclerotic heart disease of native coronary artery without angina pectoris: Secondary | ICD-10-CM | POA: Diagnosis not present

## 2018-08-09 DIAGNOSIS — K5790 Diverticulosis of intestine, part unspecified, without perforation or abscess without bleeding: Secondary | ICD-10-CM | POA: Diagnosis not present

## 2018-08-09 DIAGNOSIS — N183 Chronic kidney disease, stage 3 (moderate): Secondary | ICD-10-CM | POA: Diagnosis not present

## 2018-08-11 ENCOUNTER — Encounter (HOSPITAL_COMMUNITY): Payer: Self-pay | Admitting: *Deleted

## 2018-08-11 ENCOUNTER — Emergency Department (HOSPITAL_COMMUNITY)
Admission: EM | Admit: 2018-08-11 | Discharge: 2018-08-11 | Disposition: A | Discharge status: Skilled Nursing Facility | Payer: Medicare Other | Attending: Emergency Medicine | Admitting: Emergency Medicine

## 2018-08-11 DIAGNOSIS — W01198A Fall on same level from slipping, tripping and stumbling with subsequent striking against other object, initial encounter: Secondary | ICD-10-CM | POA: Insufficient documentation

## 2018-08-11 DIAGNOSIS — I2510 Atherosclerotic heart disease of native coronary artery without angina pectoris: Secondary | ICD-10-CM | POA: Insufficient documentation

## 2018-08-11 DIAGNOSIS — Y92129 Unspecified place in nursing home as the place of occurrence of the external cause: Secondary | ICD-10-CM | POA: Insufficient documentation

## 2018-08-11 DIAGNOSIS — Z7984 Long term (current) use of oral hypoglycemic drugs: Secondary | ICD-10-CM | POA: Insufficient documentation

## 2018-08-11 DIAGNOSIS — I5022 Chronic systolic (congestive) heart failure: Secondary | ICD-10-CM | POA: Insufficient documentation

## 2018-08-11 DIAGNOSIS — S0083XA Contusion of other part of head, initial encounter: Secondary | ICD-10-CM

## 2018-08-11 DIAGNOSIS — I110 Hypertensive heart disease with heart failure: Secondary | ICD-10-CM | POA: Diagnosis not present

## 2018-08-11 DIAGNOSIS — S0181XA Laceration without foreign body of other part of head, initial encounter: Secondary | ICD-10-CM | POA: Diagnosis not present

## 2018-08-11 DIAGNOSIS — W19XXXA Unspecified fall, initial encounter: Secondary | ICD-10-CM | POA: Diagnosis not present

## 2018-08-11 DIAGNOSIS — E119 Type 2 diabetes mellitus without complications: Secondary | ICD-10-CM | POA: Insufficient documentation

## 2018-08-11 DIAGNOSIS — Z955 Presence of coronary angioplasty implant and graft: Secondary | ICD-10-CM | POA: Insufficient documentation

## 2018-08-11 DIAGNOSIS — Y939 Activity, unspecified: Secondary | ICD-10-CM | POA: Diagnosis not present

## 2018-08-11 DIAGNOSIS — Z951 Presence of aortocoronary bypass graft: Secondary | ICD-10-CM | POA: Insufficient documentation

## 2018-08-11 DIAGNOSIS — Y999 Unspecified external cause status: Secondary | ICD-10-CM | POA: Insufficient documentation

## 2018-08-11 DIAGNOSIS — Z7982 Long term (current) use of aspirin: Secondary | ICD-10-CM | POA: Diagnosis not present

## 2018-08-11 DIAGNOSIS — R0902 Hypoxemia: Secondary | ICD-10-CM | POA: Diagnosis not present

## 2018-08-11 DIAGNOSIS — S0993XA Unspecified injury of face, initial encounter: Secondary | ICD-10-CM | POA: Diagnosis present

## 2018-08-11 DIAGNOSIS — Z79899 Other long term (current) drug therapy: Secondary | ICD-10-CM | POA: Insufficient documentation

## 2018-08-11 DIAGNOSIS — I959 Hypotension, unspecified: Secondary | ICD-10-CM | POA: Diagnosis not present

## 2018-08-11 NOTE — Discharge Instructions (Signed)
You can bath your scalp/head with mild soap and water. Showering is fine but do not soak for extended period of time such as swimming.

## 2018-08-11 NOTE — ED Triage Notes (Signed)
Per EMS pt from [facility] with c/o fall and head injury. Pt sts she was in her apartment getting ready for her day out when she tripped and fell hitting her head on the side of the  Window. Pt has swelling and laceration to the right side of face, small skin tear to right forearm. Not on blood thinners.

## 2018-08-11 NOTE — ED Provider Notes (Signed)
[facility] DEPT Provider Note   CSN: [id number] Arrival date & time: 08/11/18  0946     History   Chief Complaint Chief Complaint  Patient presents with  . Fall    HPI Jessica King is a 84 y.o. female.  HPI   84 year old female presenting after fall.  She tripped and lost her balance falling forward.  She struck her right face against a door.  She has a small laceration where she struck.  She denies any.  She explicitly denies headache, neck or back pain.  Family is at bedside.  They report that she is at her baseline mental status.  She denies any changes in vision, nausea, numbness, tingling or focal loss of strength.  She is not anticoagulated.  Past Medical History:  Diagnosis Date  . Abdominal fibromatosis   . Allergic rhinitis   . CAD (coronary artery disease)   . Chronic systolic congestive heart failure ([place])   . Coronary artery disease    Cardiac cath 1999, 2007, 2010, Taxus stent x 2 to LAD 2007.   . Diabetes mellitus without complication ([place])   . Diverticulosis   . Hyperlipidemia   . Hypertension   . Iron deficiency anemia   . Peripheral neuropathy    Frequent fall  . PVD (peripheral vascular disease) ([place])    Subclavian stent May 2013 (subclavian steal)    Patient Active Problem List   Diagnosis Date Noted  . Dyspnea 05/04/2018  . Fatigue 05/04/2018  . Dyslipidemia 05/04/2018  . Syncope 09/22/2016  . Fall 09/22/2016  . Hyperkalemia 09/22/2016  . Atrial fibrillation ([place]) 09/22/2016  . Diabetes mellitus without complication ([place]) 98/33/8250  . Chronic systolic congestive heart failure ([place])   . Abdominal fibromatosis   . Hyperlipidemia   . Hypertension   . Essential hypertension   . Laceration of scalp without foreign body   . Syncope and collapse   . Dizziness 05/02/2015  . CAD (coronary artery disease) 05/02/2015  . PVD (peripheral vascular disease) ([place]) 05/02/2015    Past Surgical History:  Procedure  Laterality Date  . BACK SURGERY     x 3 or 4 lumbar, cervical x 2  . CEA    . CORONARY ARTERY BYPASS GRAFT  2011   x 3   . Coronary Stents x3       OB History   None      Home Medications    Prior to Admission medications   Medication Sig Start Date End Date Taking? Authorizing Provider  aspirin EC 81 MG tablet Take 1 tablet (81 mg total) by mouth daily. 10/13/16   Minus Breeding, MD  Ca Carbonate-Mag Hydroxide (ROLAIDS) 550-110 MG CHEW Chew 1 tablet by mouth as needed (for heart burn).    [provider]  clonazePAM (KLONOPIN) 0.5 MG tablet Take 0.5 mg by mouth 2 (two) times daily. Takes 1 tab in daytime as needed and 1 tab every bedtime    [provider]  CRANBERRY PO Take 4,200 mg by mouth daily.    [provider]  furosemide (LASIX) 20 MG tablet Take 1 tablet (20 mg total) by mouth daily. 05/13/18 08/11/18  Minus Breeding, MD  gabapentin (NEURONTIN) 300 MG capsule Take 300-600 mg by mouth See admin instructions. Take 1 capsule (300mg ) three times daily at 0800, 1200, and 1700 and then take 2 capsules (600mg ) at bedtime    [provider]  HYDROcodone-acetaminophen (NORCO) 7.5-325 MG per tablet Take 1 tablet by mouth  3 (three) times daily. At 0800, 1300, and 2000    [provider]  hydroxypropyl methylcellulose / hypromellose (ISOPTO TEARS / GONIOVISC) 2.5 % ophthalmic solution Place 1 drop into both eyes 3 (three) times daily as needed for dry eyes.    [provider]  lisinopril (PRINIVIL,ZESTRIL) 5 MG tablet Take 1 tablet (5 mg total) by mouth daily. 09/23/16   Mikhail, Velta Addison, DO  loratadine (CLARITIN) 10 MG tablet Take 10 mg by mouth daily as needed for allergies.     [provider]  Menthol, Topical Analgesic, (BIOFREEZE) 4 % GEL Apply 1 application topically 4 (four) times daily as needed (for shoulder and/or neck pain).    [provider]  metFORMIN (GLUCOPHAGE) 1000 MG tablet Take 1,000 mg by mouth  2 (two) times daily with a meal.    [provider]  methocarbamol (ROBAXIN) 750 MG tablet Take 750 mg by mouth every 6 (six) hours as needed for muscle spasms.     [provider]  Multiple Vitamin (MULTIVITAMIN WITH MINERALS) TABS tablet Take 1 tablet by mouth daily.    [provider]  PARoxetine (PAXIL) 30 MG tablet Take 30 mg by mouth daily.     [provider]  polyethylene glycol (MIRALAX / GLYCOLAX) packet Take 17 g by mouth daily as needed for mild constipation.    [provider]  psyllium (METAMUCIL) 58.6 % powder Take 1 packet by mouth daily.    [provider]  simvastatin (ZOCOR) 40 MG tablet Take 40 mg by mouth every evening.     [provider]  traMADol (ULTRAM) 50 MG tablet Take 50 mg by mouth every 8 (eight) hours as needed for moderate pain.    [provider]  zolpidem (AMBIEN) 5 MG tablet Take 1 tablet (5 mg total) by mouth at bedtime as needed for sleep. Patient taking differently: Take 5 mg by mouth at bedtime.  09/23/16   Cristal Ford, DO    Family History Family History  Problem Relation Age of Onset  . Hypertension Mother   . Heart disease Mother        No details.  Died at age 39  . Diabetes Father   . Heart attack Brother 58    Social History Social History   Tobacco Use  . Smoking status: Never Smoker  . Smokeless tobacco: Never Used  Substance Use Topics  . Alcohol use: No  . Drug use: No     Allergies   Sulfa antibiotics   Review of Systems Review of Systems  All systems reviewed and negative, other than as noted in HPI.  Physical Exam Updated Vital Signs BP (!) 145/70   Pulse 66   Temp 97.8 F (36.6 C) (Oral)   Resp 18   SpO2 100%   Physical Exam  Constitutional: She is oriented to person, place, and time. She appears well-developed and well-nourished. No distress.  HENT:  Head: Normocephalic.  1 cm vertically oriented laceration in the right temporal  region.  Small hematoma lateral to the right eye with mild localized tenderness.  No facial/scalp tenderness elsewhere.  Eyes: Pupils are equal, round, and reactive to light. Conjunctivae and EOM are normal. Right eye exhibits no discharge. Left eye exhibits no discharge.  Neck: Neck supple.  Cardiovascular: Normal rate, regular rhythm and normal heart sounds. Exam reveals no gallop and no friction rub.  No murmur heard. Pulmonary/Chest: Effort normal and breath sounds normal. No respiratory distress.  Abdominal: Soft.  She exhibits no distension. There is no tenderness.  Musculoskeletal: She exhibits no edema or tenderness.  No midline spinal tenderness.  Range of motion of large joints without apparent discomfort.  Neurological: She is alert and oriented to person, place, and time. No cranial nerve deficit. She exhibits normal muscle tone. Coordination normal.  Skin: Skin is warm and dry.  Psychiatric: She has a normal mood and affect. Her behavior is normal. Thought content normal.  Nursing note and vitals reviewed.    ED Treatments / Results  Labs (all labs ordered are listed, but only abnormal results are displayed) Labs Reviewed - No data to display  EKG None  Radiology No results found.  Procedures Procedures (including critical care time)  LACERATION REPAIR Performed by: Virgel Manifold Authorized by: Virgel Manifold Consent: Verbal consent obtained. Risks and benefits: risks, benefits and alternatives were discussed Consent given by: patient Patient identity confirmed: provided demographic data Prepped and Draped in normal sterile fashion Wound explored  Laceration Location: R temporal region  Laceration Length: 1 cm  No Foreign Bodies seen or palpated  Anesthesia: local infiltration  Local anesthetic: none  Anesthetic total: n/a  Irrigation method: syringe Amount of cleaning: standard  Skin closure: Staple (1)  Patient tolerance: Patient tolerated the  procedure well with no immediate complications.   Medications Ordered in ED Medications - No data to display   Initial Impression / Assessment and Plan / ED Course  I have reviewed the triage vital signs and the nursing notes.  Pertinent labs & imaging results that were available during my care of the patient were reviewed by me and considered in my medical decision making (see chart for details).     84 year old female with a small facial laceration after mechanical fall.  It was stapled.  Good hemostasis.  She denies any significant pain at all.  There is no loss of consciousness.  She is very lucid for age.  Family reports that she has her baseline mental status.  She has a nonfocal neurological examination.  She is not anticoagulated.  I very low suspicion for emergent traumatic injury.  Her tetanus is current.  Continued wound care.  Return precautions discussed.  Final Clinical Impressions(s) / ED Diagnoses   Final diagnoses:  Facial laceration, initial encounter  Contusion of face, initial encounter    ED Discharge Orders    None      Virgel Manifold, MD 08/11/18 1045

## 2018-08-16 DIAGNOSIS — I2510 Atherosclerotic heart disease of native coronary artery without angina pectoris: Secondary | ICD-10-CM | POA: Diagnosis not present

## 2018-08-16 DIAGNOSIS — E1122 Type 2 diabetes mellitus with diabetic chronic kidney disease: Secondary | ICD-10-CM | POA: Diagnosis not present

## 2018-08-16 DIAGNOSIS — I129 Hypertensive chronic kidney disease with stage 1 through stage 4 chronic kidney disease, or unspecified chronic kidney disease: Secondary | ICD-10-CM | POA: Diagnosis not present

## 2018-08-16 DIAGNOSIS — E1140 Type 2 diabetes mellitus with diabetic neuropathy, unspecified: Secondary | ICD-10-CM | POA: Diagnosis not present

## 2018-08-16 DIAGNOSIS — K5790 Diverticulosis of intestine, part unspecified, without perforation or abscess without bleeding: Secondary | ICD-10-CM | POA: Diagnosis not present

## 2018-08-16 DIAGNOSIS — N183 Chronic kidney disease, stage 3 (moderate): Secondary | ICD-10-CM | POA: Diagnosis not present

## 2018-08-18 DIAGNOSIS — E1140 Type 2 diabetes mellitus with diabetic neuropathy, unspecified: Secondary | ICD-10-CM | POA: Diagnosis not present

## 2018-08-18 DIAGNOSIS — E1122 Type 2 diabetes mellitus with diabetic chronic kidney disease: Secondary | ICD-10-CM | POA: Diagnosis not present

## 2018-08-18 DIAGNOSIS — K5790 Diverticulosis of intestine, part unspecified, without perforation or abscess without bleeding: Secondary | ICD-10-CM | POA: Diagnosis not present

## 2018-08-18 DIAGNOSIS — N183 Chronic kidney disease, stage 3 (moderate): Secondary | ICD-10-CM | POA: Diagnosis not present

## 2018-08-18 DIAGNOSIS — I2510 Atherosclerotic heart disease of native coronary artery without angina pectoris: Secondary | ICD-10-CM | POA: Diagnosis not present

## 2018-08-18 DIAGNOSIS — I129 Hypertensive chronic kidney disease with stage 1 through stage 4 chronic kidney disease, or unspecified chronic kidney disease: Secondary | ICD-10-CM | POA: Diagnosis not present

## 2018-08-20 DIAGNOSIS — I129 Hypertensive chronic kidney disease with stage 1 through stage 4 chronic kidney disease, or unspecified chronic kidney disease: Secondary | ICD-10-CM | POA: Diagnosis not present

## 2018-08-20 DIAGNOSIS — I2510 Atherosclerotic heart disease of native coronary artery without angina pectoris: Secondary | ICD-10-CM | POA: Diagnosis not present

## 2018-08-20 DIAGNOSIS — E1140 Type 2 diabetes mellitus with diabetic neuropathy, unspecified: Secondary | ICD-10-CM | POA: Diagnosis not present

## 2018-08-20 DIAGNOSIS — E1122 Type 2 diabetes mellitus with diabetic chronic kidney disease: Secondary | ICD-10-CM | POA: Diagnosis not present

## 2018-08-20 DIAGNOSIS — K5790 Diverticulosis of intestine, part unspecified, without perforation or abscess without bleeding: Secondary | ICD-10-CM | POA: Diagnosis not present

## 2018-08-20 DIAGNOSIS — N183 Chronic kidney disease, stage 3 (moderate): Secondary | ICD-10-CM | POA: Diagnosis not present

## 2018-08-23 DIAGNOSIS — K5790 Diverticulosis of intestine, part unspecified, without perforation or abscess without bleeding: Secondary | ICD-10-CM | POA: Diagnosis not present

## 2018-08-23 DIAGNOSIS — N183 Chronic kidney disease, stage 3 (moderate): Secondary | ICD-10-CM | POA: Diagnosis not present

## 2018-08-23 DIAGNOSIS — E1140 Type 2 diabetes mellitus with diabetic neuropathy, unspecified: Secondary | ICD-10-CM | POA: Diagnosis not present

## 2018-08-23 DIAGNOSIS — E1122 Type 2 diabetes mellitus with diabetic chronic kidney disease: Secondary | ICD-10-CM | POA: Diagnosis not present

## 2018-08-23 DIAGNOSIS — I129 Hypertensive chronic kidney disease with stage 1 through stage 4 chronic kidney disease, or unspecified chronic kidney disease: Secondary | ICD-10-CM | POA: Diagnosis not present

## 2018-08-23 DIAGNOSIS — I2510 Atherosclerotic heart disease of native coronary artery without angina pectoris: Secondary | ICD-10-CM | POA: Diagnosis not present

## 2018-08-25 DIAGNOSIS — K5790 Diverticulosis of intestine, part unspecified, without perforation or abscess without bleeding: Secondary | ICD-10-CM | POA: Diagnosis not present

## 2018-08-25 DIAGNOSIS — I129 Hypertensive chronic kidney disease with stage 1 through stage 4 chronic kidney disease, or unspecified chronic kidney disease: Secondary | ICD-10-CM | POA: Diagnosis not present

## 2018-08-25 DIAGNOSIS — E1140 Type 2 diabetes mellitus with diabetic neuropathy, unspecified: Secondary | ICD-10-CM | POA: Diagnosis not present

## 2018-08-25 DIAGNOSIS — N183 Chronic kidney disease, stage 3 (moderate): Secondary | ICD-10-CM | POA: Diagnosis not present

## 2018-08-25 DIAGNOSIS — E1122 Type 2 diabetes mellitus with diabetic chronic kidney disease: Secondary | ICD-10-CM | POA: Diagnosis not present

## 2018-08-25 DIAGNOSIS — I2510 Atherosclerotic heart disease of native coronary artery without angina pectoris: Secondary | ICD-10-CM | POA: Diagnosis not present

## 2018-08-26 DIAGNOSIS — F330 Major depressive disorder, recurrent, mild: Secondary | ICD-10-CM | POA: Diagnosis not present

## 2018-08-26 DIAGNOSIS — F064 Anxiety disorder due to known physiological condition: Secondary | ICD-10-CM | POA: Diagnosis not present

## 2018-08-26 DIAGNOSIS — F5105 Insomnia due to other mental disorder: Secondary | ICD-10-CM | POA: Diagnosis not present

## 2018-09-03 DIAGNOSIS — F419 Anxiety disorder, unspecified: Secondary | ICD-10-CM | POA: Diagnosis not present

## 2018-09-03 DIAGNOSIS — F330 Major depressive disorder, recurrent, mild: Secondary | ICD-10-CM | POA: Diagnosis not present

## 2018-09-07 DIAGNOSIS — I480 Paroxysmal atrial fibrillation: Secondary | ICD-10-CM | POA: Diagnosis not present

## 2018-09-07 DIAGNOSIS — N183 Chronic kidney disease, stage 3 (moderate): Secondary | ICD-10-CM | POA: Diagnosis not present

## 2018-09-07 DIAGNOSIS — E1140 Type 2 diabetes mellitus with diabetic neuropathy, unspecified: Secondary | ICD-10-CM | POA: Diagnosis not present

## 2018-09-07 DIAGNOSIS — I129 Hypertensive chronic kidney disease with stage 1 through stage 4 chronic kidney disease, or unspecified chronic kidney disease: Secondary | ICD-10-CM | POA: Diagnosis not present

## 2018-09-07 DIAGNOSIS — E1121 Type 2 diabetes mellitus with diabetic nephropathy: Secondary | ICD-10-CM | POA: Diagnosis not present

## 2018-09-07 DIAGNOSIS — R269 Unspecified abnormalities of gait and mobility: Secondary | ICD-10-CM | POA: Diagnosis not present

## 2018-09-07 DIAGNOSIS — Z23 Encounter for immunization: Secondary | ICD-10-CM | POA: Diagnosis not present

## 2018-09-19 ENCOUNTER — Emergency Department (HOSPITAL_COMMUNITY)
Admission: EM | Admit: 2018-09-19 | Discharge: 2018-09-19 | Disposition: A | Discharge status: Home / Self Care | Payer: Medicare Other | Attending: Emergency Medicine | Admitting: Emergency Medicine

## 2018-09-19 ENCOUNTER — Emergency Department (HOSPITAL_COMMUNITY): Payer: Medicare Other

## 2018-09-19 ENCOUNTER — Other Ambulatory Visit: Payer: Self-pay

## 2018-09-19 ENCOUNTER — Encounter (HOSPITAL_COMMUNITY): Payer: Self-pay | Admitting: Emergency Medicine

## 2018-09-19 DIAGNOSIS — I10 Essential (primary) hypertension: Secondary | ICD-10-CM | POA: Diagnosis not present

## 2018-09-19 DIAGNOSIS — W19XXXA Unspecified fall, initial encounter: Secondary | ICD-10-CM | POA: Diagnosis not present

## 2018-09-19 DIAGNOSIS — R0902 Hypoxemia: Secondary | ICD-10-CM | POA: Diagnosis not present

## 2018-09-19 DIAGNOSIS — Z7984 Long term (current) use of oral hypoglycemic drugs: Secondary | ICD-10-CM | POA: Insufficient documentation

## 2018-09-19 DIAGNOSIS — I2510 Atherosclerotic heart disease of native coronary artery without angina pectoris: Secondary | ICD-10-CM | POA: Diagnosis not present

## 2018-09-19 DIAGNOSIS — Y929 Unspecified place or not applicable: Secondary | ICD-10-CM | POA: Insufficient documentation

## 2018-09-19 DIAGNOSIS — E785 Hyperlipidemia, unspecified: Secondary | ICD-10-CM | POA: Insufficient documentation

## 2018-09-19 DIAGNOSIS — Y9389 Activity, other specified: Secondary | ICD-10-CM | POA: Insufficient documentation

## 2018-09-19 DIAGNOSIS — S0003XA Contusion of scalp, initial encounter: Secondary | ICD-10-CM | POA: Diagnosis not present

## 2018-09-19 DIAGNOSIS — I4891 Unspecified atrial fibrillation: Secondary | ICD-10-CM | POA: Diagnosis not present

## 2018-09-19 DIAGNOSIS — I110 Hypertensive heart disease with heart failure: Secondary | ICD-10-CM | POA: Insufficient documentation

## 2018-09-19 DIAGNOSIS — S0001XA Abrasion of scalp, initial encounter: Secondary | ICD-10-CM | POA: Diagnosis not present

## 2018-09-19 DIAGNOSIS — Y998 Other external cause status: Secondary | ICD-10-CM | POA: Diagnosis not present

## 2018-09-19 DIAGNOSIS — W010XXA Fall on same level from slipping, tripping and stumbling without subsequent striking against object, initial encounter: Secondary | ICD-10-CM | POA: Diagnosis not present

## 2018-09-19 DIAGNOSIS — E119 Type 2 diabetes mellitus without complications: Secondary | ICD-10-CM | POA: Diagnosis not present

## 2018-09-19 DIAGNOSIS — Z7982 Long term (current) use of aspirin: Secondary | ICD-10-CM | POA: Diagnosis not present

## 2018-09-19 DIAGNOSIS — I5022 Chronic systolic (congestive) heart failure: Secondary | ICD-10-CM | POA: Insufficient documentation

## 2018-09-19 DIAGNOSIS — Z79899 Other long term (current) drug therapy: Secondary | ICD-10-CM | POA: Insufficient documentation

## 2018-09-19 DIAGNOSIS — S0990XA Unspecified injury of head, initial encounter: Secondary | ICD-10-CM | POA: Diagnosis present

## 2018-09-19 DIAGNOSIS — S199XXA Unspecified injury of neck, initial encounter: Secondary | ICD-10-CM | POA: Diagnosis not present

## 2018-09-19 DIAGNOSIS — I959 Hypotension, unspecified: Secondary | ICD-10-CM | POA: Diagnosis not present

## 2018-09-19 NOTE — ED Provider Notes (Signed)
[facility] EMERGENCY DEPARTMENT Provider Note   CSN: [id number] Arrival date & time: 09/19/18  3382     History   Chief Complaint Chief Complaint  Patient presents with  . Fall    HPI Jessica King is a 84 y.o. female.  The history is provided by the patient, the EMS personnel and medical records. No language interpreter was used.  Fall    Jessica King is a 84 y.o. female who presents to the Emergency Department complaining of fall. Presents to the emergency department by EMS for evaluation of injuries following a mechanical fall. She was standing with her walker and trying to put on her coat when she overtly and to the side and fell backwards, striking her head. No loss of consciousness. She denies any pain to her head, neck, chest, abdomen. Denies any recent illnesses. She takes a baby aspirin daily. Symptoms are mild to moderate in nature. She resides at [facility] assisted living.  [place] updated 8/18 Past Medical History:  Diagnosis Date  . Abdominal fibromatosis   . Allergic rhinitis   . CAD (coronary artery disease)   . Chronic systolic congestive heart failure ([place])   . Coronary artery disease    Cardiac cath 1999, 2007, 2010, Taxus stent x 2 to LAD 2007.   . Diabetes mellitus without complication ([place])   . Diverticulosis   . Hyperlipidemia   . Hypertension   . Iron deficiency anemia   . Peripheral neuropathy    Frequent fall  . PVD (peripheral vascular disease) ([place])    Subclavian stent May 2013 (subclavian steal)    Patient Active Problem List   Diagnosis Date Noted  . Dyspnea 05/04/2018  . Fatigue 05/04/2018  . Dyslipidemia 05/04/2018  . Syncope 09/22/2016  . Fall 09/22/2016  . Hyperkalemia 09/22/2016  . Atrial fibrillation ([place]) 09/22/2016  . Diabetes mellitus without complication ([place]) 50/53/9767  . Chronic systolic congestive heart failure ([place])   . Abdominal fibromatosis   . Hyperlipidemia   . Hypertension   . Essential  hypertension   . Laceration of scalp without foreign body   . Syncope and collapse   . Dizziness 05/02/2015  . CAD (coronary artery disease) 05/02/2015  . PVD (peripheral vascular disease) ([place]) 05/02/2015    Past Surgical History:  Procedure Laterality Date  . BACK SURGERY     x 3 or 4 lumbar, cervical x 2  . CEA    . CORONARY ARTERY BYPASS GRAFT  2011   x 3   . Coronary Stents x3       OB History   None      Home Medications    Prior to Admission medications   Medication Sig Start Date End Date Taking? Authorizing Provider  aspirin EC 81 MG tablet Take 1 tablet (81 mg total) by mouth daily. 10/13/16   Minus Breeding, MD  Ca Carbonate-Mag Hydroxide (ROLAIDS) 550-110 MG CHEW Chew 1 tablet by mouth as needed (for heart burn).    [provider]  clonazePAM (KLONOPIN) 0.5 MG tablet Take 0.5 mg by mouth 2 (two) times daily. Takes 1 tab in daytime as needed and 1 tab every bedtime    [provider]  CRANBERRY PO Take 4,200 mg by mouth daily.    [provider]  furosemide (LASIX) 20 MG tablet Take 1 tablet (20 mg total) by mouth daily. 05/13/18 08/11/18  Minus Breeding, MD  gabapentin (NEURONTIN) 300 MG capsule Take 300-600 mg by mouth See admin instructions.  Take 1 capsule (300mg ) three times daily at 0800, 1200, and 1700 and then take 2 capsules (600mg ) at bedtime    [provider]  HYDROcodone-acetaminophen (NORCO) 7.5-325 MG per tablet Take 1 tablet by mouth 3 (three) times daily. At 0800, 1300, and 2000    [provider]  hydroxypropyl methylcellulose / hypromellose (ISOPTO TEARS / GONIOVISC) 2.5 % ophthalmic solution Place 1 drop into both eyes 3 (three) times daily as needed for dry eyes.    [provider]  lisinopril (PRINIVIL,ZESTRIL) 5 MG tablet Take 1 tablet (5 mg total) by mouth daily. 09/23/16   Mikhail, Velta Addison, DO  loratadine (CLARITIN) 10 MG tablet Take 10 mg by mouth daily as needed for allergies.      [provider]  Menthol, Topical Analgesic, (BIOFREEZE) 4 % GEL Apply 1 application topically 4 (four) times daily as needed (for shoulder and/or neck pain).    [provider]  metFORMIN (GLUCOPHAGE) 1000 MG tablet Take 1,000 mg by mouth 2 (two) times daily with a meal.    [provider]  methocarbamol (ROBAXIN) 750 MG tablet Take 750 mg by mouth every 6 (six) hours as needed for muscle spasms.     [provider]  Multiple Vitamin (MULTIVITAMIN WITH MINERALS) TABS tablet Take 1 tablet by mouth daily.    [provider]  PARoxetine (PAXIL) 30 MG tablet Take 30 mg by mouth daily.     [provider]  polyethylene glycol (MIRALAX / GLYCOLAX) packet Take 17 g by mouth daily as needed for mild constipation.    [provider]  psyllium (METAMUCIL) 58.6 % powder Take 1 packet by mouth daily.    [provider]  simvastatin (ZOCOR) 40 MG tablet Take 40 mg by mouth every evening.     [provider]  traMADol (ULTRAM) 50 MG tablet Take 50 mg by mouth every 8 (eight) hours as needed for moderate pain.    [provider]  zolpidem (AMBIEN) 5 MG tablet Take 1 tablet (5 mg total) by mouth at bedtime as needed for sleep. Patient taking differently: Take 5 mg by mouth at bedtime.  09/23/16   Cristal Ford, DO    Family History Family History  Problem Relation Age of Onset  . Hypertension Mother   . Heart disease Mother        No details.  Died at age 103  . Diabetes Father   . Heart attack Brother 61    Social History Social History   Tobacco Use  . Smoking status: Never Smoker  . Smokeless tobacco: Never Used  Substance Use Topics  . Alcohol use: No  . Drug use: No     Allergies   Sulfa antibiotics   Review of Systems Review of Systems  All other systems reviewed and are negative.    Physical Exam Updated Vital Signs BP 128/89   Pulse 81   Temp 97.9 F (36.6 C)   Resp 19   SpO2 97%     Physical Exam  Constitutional: She is oriented to person, place, and time. She appears well-developed and well-nourished.  HENT:  Head: Normocephalic.  Hematoma and abrasion to the right occipital scalp. Abrasion to the right temporal scalp.  Neck:  No midline cervical tenderness  Cardiovascular:  No murmur heard. irregularly irregular rhythm  Pulmonary/Chest: Effort normal and breath sounds normal. No respiratory distress.  Abdominal: Soft. There is no tenderness. There is no rebound and no guarding.  Musculoskeletal: She  exhibits no edema or tenderness.  2+ DP pulses bilaterally. There is a chronic appearing deformity to the left shoulder without tenderness. There is decreased range of motion in bilateral shoulders, patient states this is chronic. No hip tenderness to palpation.  Neurological: She is alert and oriented to person, place, and time.  Five out of five strength in all four extremities  Skin: Skin is warm and dry.  Psychiatric: She has a normal mood and affect. Her behavior is normal.  Nursing note and vitals reviewed.    ED Treatments / Results  Labs (all labs ordered are listed, but only abnormal results are displayed) Labs Reviewed - No data to display  EKG None  Radiology No results found.  Procedures Procedures (including critical care time)  Medications Ordered in ED Medications - No data to display   Initial Impression / Assessment and Plan / ED Course  I have reviewed the triage vital signs and the nursing notes.  Pertinent labs & imaging results that were available during my care of the patient were reviewed by me and considered in my medical decision making (see chart for details).     Should here for evaluation of injuries following a mechanical fall. She does have abrasions to her scalp. No evidence of serious intracranial injury based on imaging. CT C-spine does demonstrate progressive arthritis and spinal stenosis. She is neurologically  intact on examination. She does have difficulty with range of motion of bilateral upper extremities due to prior injuries but strength is intact. Plan to discharge home with close PCP as well as neurosurgery referral. Return precautions discussed.  Final Clinical Impressions(s) / ED Diagnoses   Final diagnoses:  None    ED Discharge Orders    None       Quintella Reichert, MD 09/19/18 1528

## 2018-09-19 NOTE — ED Notes (Signed)
Patient verbalizes understanding of discharge instructions. Opportunity for questioning and answers were provided. Armband removed by staff, pt discharged from ED.  

## 2018-09-19 NOTE — ED Triage Notes (Signed)
Per EMS- Pt from [facility] on [address]. the pt was putting on her coat and lost her balance, had a fall and hit her head. lacerta ion to posterior right head and lateral right head. She takes Asprin. EKG shows afib with controlled rate, hx of same. 20 G LFA. Pt has unsteady gait upon arrival, states she uses a walker normally. Alert and oriented.

## 2018-09-19 NOTE — Discharge Instructions (Addendum)
You had a CT scan of your neck performed today that showed worsening arthritis in your neck. Please follow-up with neurosurgery. Get rechecked immediately if you have any new numbness or weakness.

## 2018-09-22 DIAGNOSIS — N183 Chronic kidney disease, stage 3 (moderate): Secondary | ICD-10-CM | POA: Diagnosis not present

## 2018-09-22 DIAGNOSIS — M549 Dorsalgia, unspecified: Secondary | ICD-10-CM | POA: Diagnosis not present

## 2018-09-22 DIAGNOSIS — E1121 Type 2 diabetes mellitus with diabetic nephropathy: Secondary | ICD-10-CM | POA: Diagnosis not present

## 2018-09-22 DIAGNOSIS — G8929 Other chronic pain: Secondary | ICD-10-CM | POA: Diagnosis not present

## 2018-09-22 DIAGNOSIS — E7800 Pure hypercholesterolemia, unspecified: Secondary | ICD-10-CM | POA: Diagnosis not present

## 2018-09-22 DIAGNOSIS — D500 Iron deficiency anemia secondary to blood loss (chronic): Secondary | ICD-10-CM | POA: Diagnosis not present

## 2018-09-22 DIAGNOSIS — K5790 Diverticulosis of intestine, part unspecified, without perforation or abscess without bleeding: Secondary | ICD-10-CM | POA: Diagnosis not present

## 2018-09-22 DIAGNOSIS — I480 Paroxysmal atrial fibrillation: Secondary | ICD-10-CM | POA: Diagnosis not present

## 2018-09-22 DIAGNOSIS — I129 Hypertensive chronic kidney disease with stage 1 through stage 4 chronic kidney disease, or unspecified chronic kidney disease: Secondary | ICD-10-CM | POA: Diagnosis not present

## 2018-09-22 DIAGNOSIS — D509 Iron deficiency anemia, unspecified: Secondary | ICD-10-CM | POA: Diagnosis not present

## 2018-09-22 DIAGNOSIS — I2510 Atherosclerotic heart disease of native coronary artery without angina pectoris: Secondary | ICD-10-CM | POA: Diagnosis not present

## 2018-09-22 DIAGNOSIS — M542 Cervicalgia: Secondary | ICD-10-CM | POA: Diagnosis not present

## 2018-09-23 DIAGNOSIS — F5105 Insomnia due to other mental disorder: Secondary | ICD-10-CM | POA: Diagnosis not present

## 2018-09-23 DIAGNOSIS — F064 Anxiety disorder due to known physiological condition: Secondary | ICD-10-CM | POA: Diagnosis not present

## 2018-09-23 DIAGNOSIS — F330 Major depressive disorder, recurrent, mild: Secondary | ICD-10-CM | POA: Diagnosis not present

## 2018-09-28 DIAGNOSIS — N183 Chronic kidney disease, stage 3 (moderate): Secondary | ICD-10-CM | POA: Diagnosis not present

## 2018-09-28 DIAGNOSIS — I129 Hypertensive chronic kidney disease with stage 1 through stage 4 chronic kidney disease, or unspecified chronic kidney disease: Secondary | ICD-10-CM | POA: Diagnosis not present

## 2018-09-28 DIAGNOSIS — I480 Paroxysmal atrial fibrillation: Secondary | ICD-10-CM | POA: Diagnosis not present

## 2018-09-28 DIAGNOSIS — R269 Unspecified abnormalities of gait and mobility: Secondary | ICD-10-CM | POA: Diagnosis not present

## 2018-09-29 DIAGNOSIS — I2510 Atherosclerotic heart disease of native coronary artery without angina pectoris: Secondary | ICD-10-CM | POA: Diagnosis not present

## 2018-09-29 DIAGNOSIS — I480 Paroxysmal atrial fibrillation: Secondary | ICD-10-CM | POA: Diagnosis not present

## 2018-09-29 DIAGNOSIS — K5790 Diverticulosis of intestine, part unspecified, without perforation or abscess without bleeding: Secondary | ICD-10-CM | POA: Diagnosis not present

## 2018-09-29 DIAGNOSIS — I129 Hypertensive chronic kidney disease with stage 1 through stage 4 chronic kidney disease, or unspecified chronic kidney disease: Secondary | ICD-10-CM | POA: Diagnosis not present

## 2018-09-29 DIAGNOSIS — N183 Chronic kidney disease, stage 3 (moderate): Secondary | ICD-10-CM | POA: Diagnosis not present

## 2018-09-29 DIAGNOSIS — E1121 Type 2 diabetes mellitus with diabetic nephropathy: Secondary | ICD-10-CM | POA: Diagnosis not present

## 2018-09-30 ENCOUNTER — Other Ambulatory Visit: Payer: Self-pay | Admitting: Geriatric Medicine

## 2018-09-30 DIAGNOSIS — G4452 New daily persistent headache (NDPH): Secondary | ICD-10-CM

## 2018-09-30 DIAGNOSIS — F330 Major depressive disorder, recurrent, mild: Secondary | ICD-10-CM | POA: Diagnosis not present

## 2018-09-30 DIAGNOSIS — F064 Anxiety disorder due to known physiological condition: Secondary | ICD-10-CM | POA: Diagnosis not present

## 2018-10-01 ENCOUNTER — Ambulatory Visit
Admission: RE | Admit: 2018-10-01 | Discharge: 2018-10-01 | Disposition: A | Discharge status: Home / Self Care | Payer: Medicare Other | Source: Ambulatory Visit | Attending: Geriatric Medicine | Admitting: Geriatric Medicine

## 2018-10-01 DIAGNOSIS — G4452 New daily persistent headache (NDPH): Secondary | ICD-10-CM

## 2018-10-01 DIAGNOSIS — R269 Unspecified abnormalities of gait and mobility: Secondary | ICD-10-CM | POA: Diagnosis not present

## 2018-10-01 DIAGNOSIS — R51 Headache: Secondary | ICD-10-CM | POA: Diagnosis not present

## 2018-10-07 DIAGNOSIS — E1121 Type 2 diabetes mellitus with diabetic nephropathy: Secondary | ICD-10-CM | POA: Diagnosis not present

## 2018-10-07 DIAGNOSIS — N183 Chronic kidney disease, stage 3 (moderate): Secondary | ICD-10-CM | POA: Diagnosis not present

## 2018-10-07 DIAGNOSIS — I480 Paroxysmal atrial fibrillation: Secondary | ICD-10-CM | POA: Diagnosis not present

## 2018-10-07 DIAGNOSIS — I2510 Atherosclerotic heart disease of native coronary artery without angina pectoris: Secondary | ICD-10-CM | POA: Diagnosis not present

## 2018-10-07 DIAGNOSIS — I129 Hypertensive chronic kidney disease with stage 1 through stage 4 chronic kidney disease, or unspecified chronic kidney disease: Secondary | ICD-10-CM | POA: Diagnosis not present

## 2018-10-07 DIAGNOSIS — K5790 Diverticulosis of intestine, part unspecified, without perforation or abscess without bleeding: Secondary | ICD-10-CM | POA: Diagnosis not present

## 2018-10-15 DIAGNOSIS — I2510 Atherosclerotic heart disease of native coronary artery without angina pectoris: Secondary | ICD-10-CM | POA: Diagnosis not present

## 2018-10-15 DIAGNOSIS — E1121 Type 2 diabetes mellitus with diabetic nephropathy: Secondary | ICD-10-CM | POA: Diagnosis not present

## 2018-10-15 DIAGNOSIS — I480 Paroxysmal atrial fibrillation: Secondary | ICD-10-CM | POA: Diagnosis not present

## 2018-10-15 DIAGNOSIS — K5790 Diverticulosis of intestine, part unspecified, without perforation or abscess without bleeding: Secondary | ICD-10-CM | POA: Diagnosis not present

## 2018-10-15 DIAGNOSIS — I129 Hypertensive chronic kidney disease with stage 1 through stage 4 chronic kidney disease, or unspecified chronic kidney disease: Secondary | ICD-10-CM | POA: Diagnosis not present

## 2018-10-15 DIAGNOSIS — N183 Chronic kidney disease, stage 3 (moderate): Secondary | ICD-10-CM | POA: Diagnosis not present

## 2018-10-21 DIAGNOSIS — F5105 Insomnia due to other mental disorder: Secondary | ICD-10-CM | POA: Diagnosis not present

## 2018-10-21 DIAGNOSIS — F064 Anxiety disorder due to known physiological condition: Secondary | ICD-10-CM | POA: Diagnosis not present

## 2018-10-21 DIAGNOSIS — F330 Major depressive disorder, recurrent, mild: Secondary | ICD-10-CM | POA: Diagnosis not present

## 2018-10-22 DIAGNOSIS — N183 Chronic kidney disease, stage 3 (moderate): Secondary | ICD-10-CM | POA: Diagnosis not present

## 2018-10-22 DIAGNOSIS — I480 Paroxysmal atrial fibrillation: Secondary | ICD-10-CM | POA: Diagnosis not present

## 2018-10-22 DIAGNOSIS — R269 Unspecified abnormalities of gait and mobility: Secondary | ICD-10-CM | POA: Diagnosis not present

## 2018-10-22 DIAGNOSIS — E1121 Type 2 diabetes mellitus with diabetic nephropathy: Secondary | ICD-10-CM | POA: Diagnosis not present

## 2018-10-22 DIAGNOSIS — E7800 Pure hypercholesterolemia, unspecified: Secondary | ICD-10-CM | POA: Diagnosis not present

## 2018-10-22 DIAGNOSIS — I129 Hypertensive chronic kidney disease with stage 1 through stage 4 chronic kidney disease, or unspecified chronic kidney disease: Secondary | ICD-10-CM | POA: Diagnosis not present

## 2018-10-22 DIAGNOSIS — Z79899 Other long term (current) drug therapy: Secondary | ICD-10-CM | POA: Diagnosis not present

## 2018-10-22 DIAGNOSIS — E1140 Type 2 diabetes mellitus with diabetic neuropathy, unspecified: Secondary | ICD-10-CM | POA: Diagnosis not present

## 2018-10-30 ENCOUNTER — Emergency Department (HOSPITAL_COMMUNITY)
Admission: EM | Admit: 2018-10-30 | Discharge: 2018-10-30 | Disposition: A | Discharge status: Home / Self Care | Payer: Medicare Other | Attending: Emergency Medicine | Admitting: Emergency Medicine

## 2018-10-30 ENCOUNTER — Other Ambulatory Visit: Payer: Self-pay

## 2018-10-30 ENCOUNTER — Emergency Department (HOSPITAL_COMMUNITY): Payer: Medicare Other

## 2018-10-30 ENCOUNTER — Encounter (HOSPITAL_COMMUNITY): Payer: Self-pay | Admitting: *Deleted

## 2018-10-30 DIAGNOSIS — W050XXA Fall from non-moving wheelchair, initial encounter: Secondary | ICD-10-CM | POA: Insufficient documentation

## 2018-10-30 DIAGNOSIS — I110 Hypertensive heart disease with heart failure: Secondary | ICD-10-CM | POA: Diagnosis not present

## 2018-10-30 DIAGNOSIS — R220 Localized swelling, mass and lump, head: Secondary | ICD-10-CM | POA: Diagnosis not present

## 2018-10-30 DIAGNOSIS — I5022 Chronic systolic (congestive) heart failure: Secondary | ICD-10-CM | POA: Diagnosis not present

## 2018-10-30 DIAGNOSIS — S199XXA Unspecified injury of neck, initial encounter: Secondary | ICD-10-CM | POA: Diagnosis not present

## 2018-10-30 DIAGNOSIS — I2510 Atherosclerotic heart disease of native coronary artery without angina pectoris: Secondary | ICD-10-CM | POA: Diagnosis not present

## 2018-10-30 DIAGNOSIS — Y92199 Unspecified place in other specified residential institution as the place of occurrence of the external cause: Secondary | ICD-10-CM | POA: Insufficient documentation

## 2018-10-30 DIAGNOSIS — S0181XA Laceration without foreign body of other part of head, initial encounter: Secondary | ICD-10-CM | POA: Insufficient documentation

## 2018-10-30 DIAGNOSIS — Z7982 Long term (current) use of aspirin: Secondary | ICD-10-CM | POA: Insufficient documentation

## 2018-10-30 DIAGNOSIS — W19XXXA Unspecified fall, initial encounter: Secondary | ICD-10-CM | POA: Diagnosis not present

## 2018-10-30 DIAGNOSIS — Y999 Unspecified external cause status: Secondary | ICD-10-CM | POA: Insufficient documentation

## 2018-10-30 DIAGNOSIS — M542 Cervicalgia: Secondary | ICD-10-CM | POA: Diagnosis not present

## 2018-10-30 DIAGNOSIS — R0902 Hypoxemia: Secondary | ICD-10-CM | POA: Diagnosis not present

## 2018-10-30 DIAGNOSIS — Z7984 Long term (current) use of oral hypoglycemic drugs: Secondary | ICD-10-CM | POA: Diagnosis not present

## 2018-10-30 DIAGNOSIS — E119 Type 2 diabetes mellitus without complications: Secondary | ICD-10-CM | POA: Insufficient documentation

## 2018-10-30 DIAGNOSIS — S0990XA Unspecified injury of head, initial encounter: Secondary | ICD-10-CM | POA: Diagnosis not present

## 2018-10-30 DIAGNOSIS — Y9389 Activity, other specified: Secondary | ICD-10-CM | POA: Diagnosis not present

## 2018-10-30 DIAGNOSIS — I10 Essential (primary) hypertension: Secondary | ICD-10-CM | POA: Diagnosis not present

## 2018-10-30 DIAGNOSIS — Z79899 Other long term (current) drug therapy: Secondary | ICD-10-CM | POA: Insufficient documentation

## 2018-10-30 DIAGNOSIS — S161XXA Strain of muscle, fascia and tendon at neck level, initial encounter: Secondary | ICD-10-CM | POA: Diagnosis not present

## 2018-10-30 DIAGNOSIS — R51 Headache: Secondary | ICD-10-CM | POA: Diagnosis not present

## 2018-10-30 MED ORDER — LIDOCAINE HCL (PF) 1 % IJ SOLN
5.0000 mL | Freq: Once | INTRAMUSCULAR | Status: AC
  Administered 2018-10-30: 5 mL via INTRADERMAL
  Filled 2018-10-30: qty 30

## 2018-10-30 NOTE — ED Provider Notes (Addendum)
[facility] DEPT Provider Note   CSN: [id number] Arrival date & time: 10/30/18  1809     History   Chief Complaint No chief complaint on file.   HPI Jessica King is a 84 y.o. female.  Patient is a 84 year old female with history of coronary artery disease, CHF, hypertension, and peripheral vascular disease.  She presents today for evaluation of a fall.  She reports tripping at her standard care facility, falling forward, and striking her head on the.  She has swelling and a laceration to the right forehead.  She denies any loss of consciousness.  She does report headache and mild neck pain.  No other injuries.    The history is provided by the patient.    Past Medical History:  Diagnosis Date  . Abdominal fibromatosis   . Allergic rhinitis   . CAD (coronary artery disease)   . Chronic systolic congestive heart failure ([place])   . Coronary artery disease    Cardiac cath 1999, 2007, 2010, Taxus stent x 2 to LAD 2007.   . Diabetes mellitus without complication ([place])   . Diverticulosis   . Hyperlipidemia   . Hypertension   . Iron deficiency anemia   . Peripheral neuropathy    Frequent fall  . PVD (peripheral vascular disease) ([place])    Subclavian stent May 2013 (subclavian steal)    Patient Active Problem List   Diagnosis Date Noted  . Dyspnea 05/04/2018  . Fatigue 05/04/2018  . Dyslipidemia 05/04/2018  . Syncope 09/22/2016  . Fall 09/22/2016  . Hyperkalemia 09/22/2016  . Atrial fibrillation ([place]) 09/22/2016  . Diabetes mellitus without complication () 02/40/9735  . Chronic systolic congestive heart failure ([place])   . Abdominal fibromatosis   . Hyperlipidemia   . Hypertension   . Essential hypertension   . Laceration of scalp without foreign body   . Syncope and collapse   . Dizziness 05/02/2015  . CAD (coronary artery disease) 05/02/2015  . PVD (peripheral vascular disease) ([place]) 05/02/2015    Past Surgical History:    Procedure Laterality Date  . BACK SURGERY     x 3 or 4 lumbar, cervical x 2  . CEA    . CORONARY ARTERY BYPASS GRAFT  2011   x 3   . Coronary Stents x3       OB History   None      Home Medications    Prior to Admission medications   Medication Sig Start Date End Date Taking? Authorizing Provider  aspirin EC 81 MG tablet Take 1 tablet (81 mg total) by mouth daily. 10/13/16   Minus Breeding, MD  Ca Carbonate-Mag Hydroxide (ROLAIDS) 550-110 MG CHEW Chew 1 tablet by mouth as needed (for heart burn).    [provider]  clonazePAM (KLONOPIN) 0.5 MG tablet Take 0.5 mg by mouth 2 (two) times daily.     [provider]  CRANBERRY PO Take 4,200 mg by mouth daily.    [provider]  furosemide (LASIX) 20 MG tablet Take 1 tablet (20 mg total) by mouth daily. 05/13/18 09/19/18  Minus Breeding, MD  gabapentin (NEURONTIN) 300 MG capsule Take 300-600 mg by mouth See admin instructions. Take 300 mg in at 8am 12:pm and 17:00  Take 600 mg at 20:00    [provider]  HYDROcodone-acetaminophen (NORCO) 7.5-325 MG per tablet Take 1 tablet by mouth every 8 (eight) hours.     [provider]  lisinopril (PRINIVIL,ZESTRIL) 5 MG tablet Take  1 tablet (5 mg total) by mouth daily. 09/23/16   Mikhail, Velta Addison, DO  loratadine (CLARITIN) 10 MG tablet Take 10 mg by mouth daily as needed for allergies.     [provider]  metFORMIN (GLUCOPHAGE) 1000 MG tablet Take 1,000 mg by mouth daily with breakfast.     [provider]  methocarbamol (ROBAXIN) 750 MG tablet Take 750 mg by mouth every 6 (six) hours as needed for muscle spasms.     [provider]  Multiple Vitamin (MULTIVITAMIN WITH MINERALS) TABS tablet Take 1 tablet by mouth daily.    [provider]  PARoxetine (PAXIL) 30 MG tablet Take 30 mg by mouth daily.     [provider]  PRESCRIPTION MEDICATION 2 (two) times daily. Provide Ted Hoses in the morning and remove  at bedtime "Need Measurement to send"    [provider]  psyllium (METAMUCIL) 58.6 % powder Take 1 packet by mouth daily. Mix 1 Tablespoon  In suitable liquid and drink by mouth once daily as needed    [provider]  simvastatin (ZOCOR) 40 MG tablet Take 40 mg by mouth every evening.     [provider]  traMADol (ULTRAM) 50 MG tablet Take 50 mg by mouth every 8 (eight) hours as needed for moderate pain.    [provider]  zolpidem (AMBIEN) 5 MG tablet Take 1 tablet (5 mg total) by mouth at bedtime as needed for sleep. Patient taking differently: Take 5 mg by mouth at bedtime.  09/23/16   Cristal Ford, DO    Family History Family History  Problem Relation Age of Onset  . Hypertension Mother   . Heart disease Mother        No details.  Died at age 21  . Diabetes Father   . Heart attack Brother 66    Social History Social History   Tobacco Use  . Smoking status: Never Smoker  . Smokeless tobacco: Never Used  Substance Use Topics  . Alcohol use: No  . Drug use: No     Allergies   Sulfa antibiotics   Review of Systems Review of Systems  All other systems reviewed and are negative.    Physical Exam Updated Vital Signs BP (!) 158/79 (BP Location: Left Arm)   Pulse 76   Temp 98.2 F (36.8 C) (Oral)   Resp 15   SpO2 98%   Physical Exam  Constitutional: She is oriented to person, place, and time. She appears well-developed and well-nourished. No distress.  HENT:  Head: Normocephalic.  There is a contusion and laceration to the right upper forehead.  Laceration measures approximately 2.5 cm.  Bleeding is controlled.  Eyes: Pupils are equal, round, and reactive to light. EOM are normal.  Neck: Normal range of motion. Neck supple.  She has mild tenderness in the soft tissues of the cervical region.  There is no bony tenderness or step-off.  Cardiovascular: Normal rate and regular rhythm. Exam reveals no gallop and no friction  rub.  No murmur heard. Pulmonary/Chest: Effort normal and breath sounds normal. No respiratory distress. She has no wheezes.  Abdominal: Soft. Bowel sounds are normal. She exhibits no distension. There is no tenderness.  Musculoskeletal: Normal range of motion.  Neurological: She is alert and oriented to person, place, and time. No cranial nerve deficit. She exhibits normal muscle tone. Coordination normal.  Skin: Skin is warm and dry. She is not diaphoretic.  Nursing note and vitals reviewed.  ED Treatments / Results  Labs (all labs ordered are listed, but only abnormal results are displayed) Labs Reviewed - No data to display  EKG None  Radiology No results found.  Procedures Procedures (including critical care time)  Medications Ordered in ED Medications - No data to display   Initial Impression / Assessment and Plan / ED Course  I have reviewed the triage vital signs and the nursing notes.  Pertinent labs & imaging results that were available during my care of the patient were reviewed by me and considered in my medical decision making (see chart for details).  Patient presents here after falling at her extended care facility.  She landed on her head and has a laceration and swelling to the upper right forehead.  CT scan of the head and cervical spine are negative.  She had no loss of consciousness and this was a mechanical fall with no syncope.  She laceration was repaired and she will be discharged.  LACERATION REPAIR Performed by: Veryl Speak Authorized by: Veryl Speak Consent: Verbal consent obtained. Risks and benefits: risks, benefits and alternatives were discussed Consent given by: patient Patient identity confirmed: provided demographic data Prepped and Draped in normal sterile fashion Wound explored  Laceration Location: Right upper forehead  Laceration Length: 2.5 cm  No Foreign Bodies seen or palpated  Anesthesia: local infiltration  Local  anesthetic: lidocaine 1 % without epinephrine  Anesthetic total: 3 ml  Irrigation method: syringe Amount of cleaning: standard  Skin closure: 5-0 Prolene  Number of sutures: 4  Technique: Simple interrupted  Patient tolerance: Patient tolerated the procedure well with no immediate complications.   Final Clinical Impressions(s) / ED Diagnoses   Final diagnoses:  None    ED Discharge Orders    None       Veryl Speak, MD 10/31/18 1558    Veryl Speak, MD 10/31/18 1559

## 2018-10-30 NOTE — ED Notes (Signed)
Bed: WA08 Expected date:  Expected time:  Means of arrival:  Comments: Fall

## 2018-10-30 NOTE — ED Notes (Signed)
Set up and lido at bed side with cart

## 2018-10-30 NOTE — ED Triage Notes (Signed)
Patient from [facility]. Patient is wheelchair bound while attempting to transfer from chair to wheelchair patient states she fell and hit head on floor. Patient is not on any blood thinners and did not loose consciousness. Patient alert and oriented x4

## 2018-10-30 NOTE — Discharge Instructions (Addendum)
Local wound care with bacitracin and dressing changes twice daily.  Follow-up with your primary care doctor for suture removal in 1 week.  Return to the emergency department for severe headache, worsening pain, or other new and concerning symptoms.

## 2018-11-04 DIAGNOSIS — K5790 Diverticulosis of intestine, part unspecified, without perforation or abscess without bleeding: Secondary | ICD-10-CM | POA: Diagnosis not present

## 2018-11-04 DIAGNOSIS — I129 Hypertensive chronic kidney disease with stage 1 through stage 4 chronic kidney disease, or unspecified chronic kidney disease: Secondary | ICD-10-CM | POA: Diagnosis not present

## 2018-11-04 DIAGNOSIS — N183 Chronic kidney disease, stage 3 (moderate): Secondary | ICD-10-CM | POA: Diagnosis not present

## 2018-11-04 DIAGNOSIS — I2510 Atherosclerotic heart disease of native coronary artery without angina pectoris: Secondary | ICD-10-CM | POA: Diagnosis not present

## 2018-11-04 DIAGNOSIS — E1121 Type 2 diabetes mellitus with diabetic nephropathy: Secondary | ICD-10-CM | POA: Diagnosis not present

## 2018-11-04 DIAGNOSIS — I480 Paroxysmal atrial fibrillation: Secondary | ICD-10-CM | POA: Diagnosis not present

## 2018-11-11 DIAGNOSIS — I480 Paroxysmal atrial fibrillation: Secondary | ICD-10-CM | POA: Diagnosis not present

## 2018-11-11 DIAGNOSIS — K5790 Diverticulosis of intestine, part unspecified, without perforation or abscess without bleeding: Secondary | ICD-10-CM | POA: Diagnosis not present

## 2018-11-11 DIAGNOSIS — I2510 Atherosclerotic heart disease of native coronary artery without angina pectoris: Secondary | ICD-10-CM | POA: Diagnosis not present

## 2018-11-11 DIAGNOSIS — N183 Chronic kidney disease, stage 3 (moderate): Secondary | ICD-10-CM | POA: Diagnosis not present

## 2018-11-11 DIAGNOSIS — E1121 Type 2 diabetes mellitus with diabetic nephropathy: Secondary | ICD-10-CM | POA: Diagnosis not present

## 2018-11-11 DIAGNOSIS — I129 Hypertensive chronic kidney disease with stage 1 through stage 4 chronic kidney disease, or unspecified chronic kidney disease: Secondary | ICD-10-CM | POA: Diagnosis not present

## 2018-11-12 DIAGNOSIS — K5790 Diverticulosis of intestine, part unspecified, without perforation or abscess without bleeding: Secondary | ICD-10-CM | POA: Diagnosis not present

## 2018-11-12 DIAGNOSIS — I480 Paroxysmal atrial fibrillation: Secondary | ICD-10-CM | POA: Diagnosis not present

## 2018-11-12 DIAGNOSIS — I2510 Atherosclerotic heart disease of native coronary artery without angina pectoris: Secondary | ICD-10-CM | POA: Diagnosis not present

## 2018-11-12 DIAGNOSIS — I129 Hypertensive chronic kidney disease with stage 1 through stage 4 chronic kidney disease, or unspecified chronic kidney disease: Secondary | ICD-10-CM | POA: Diagnosis not present

## 2018-11-12 DIAGNOSIS — E1121 Type 2 diabetes mellitus with diabetic nephropathy: Secondary | ICD-10-CM | POA: Diagnosis not present

## 2018-11-12 DIAGNOSIS — N183 Chronic kidney disease, stage 3 (moderate): Secondary | ICD-10-CM | POA: Diagnosis not present

## 2018-11-16 DIAGNOSIS — N183 Chronic kidney disease, stage 3 (moderate): Secondary | ICD-10-CM | POA: Diagnosis not present

## 2018-11-16 DIAGNOSIS — I129 Hypertensive chronic kidney disease with stage 1 through stage 4 chronic kidney disease, or unspecified chronic kidney disease: Secondary | ICD-10-CM | POA: Diagnosis not present

## 2018-11-16 DIAGNOSIS — E1121 Type 2 diabetes mellitus with diabetic nephropathy: Secondary | ICD-10-CM | POA: Diagnosis not present

## 2018-11-16 DIAGNOSIS — I480 Paroxysmal atrial fibrillation: Secondary | ICD-10-CM | POA: Diagnosis not present

## 2018-11-16 DIAGNOSIS — K5790 Diverticulosis of intestine, part unspecified, without perforation or abscess without bleeding: Secondary | ICD-10-CM | POA: Diagnosis not present

## 2018-11-16 DIAGNOSIS — I2510 Atherosclerotic heart disease of native coronary artery without angina pectoris: Secondary | ICD-10-CM | POA: Diagnosis not present

## 2018-11-17 DIAGNOSIS — N183 Chronic kidney disease, stage 3 (moderate): Secondary | ICD-10-CM | POA: Diagnosis not present

## 2018-11-17 DIAGNOSIS — K5790 Diverticulosis of intestine, part unspecified, without perforation or abscess without bleeding: Secondary | ICD-10-CM | POA: Diagnosis not present

## 2018-11-17 DIAGNOSIS — I2510 Atherosclerotic heart disease of native coronary artery without angina pectoris: Secondary | ICD-10-CM | POA: Diagnosis not present

## 2018-11-17 DIAGNOSIS — E1121 Type 2 diabetes mellitus with diabetic nephropathy: Secondary | ICD-10-CM | POA: Diagnosis not present

## 2018-11-17 DIAGNOSIS — I129 Hypertensive chronic kidney disease with stage 1 through stage 4 chronic kidney disease, or unspecified chronic kidney disease: Secondary | ICD-10-CM | POA: Diagnosis not present

## 2018-11-17 DIAGNOSIS — I480 Paroxysmal atrial fibrillation: Secondary | ICD-10-CM | POA: Diagnosis not present

## 2018-11-18 DIAGNOSIS — F5105 Insomnia due to other mental disorder: Secondary | ICD-10-CM | POA: Diagnosis not present

## 2018-11-18 DIAGNOSIS — F064 Anxiety disorder due to known physiological condition: Secondary | ICD-10-CM | POA: Diagnosis not present

## 2018-11-18 DIAGNOSIS — F330 Major depressive disorder, recurrent, mild: Secondary | ICD-10-CM | POA: Diagnosis not present

## 2018-11-21 DIAGNOSIS — I129 Hypertensive chronic kidney disease with stage 1 through stage 4 chronic kidney disease, or unspecified chronic kidney disease: Secondary | ICD-10-CM | POA: Diagnosis not present

## 2018-11-21 DIAGNOSIS — I480 Paroxysmal atrial fibrillation: Secondary | ICD-10-CM | POA: Diagnosis not present

## 2018-11-21 DIAGNOSIS — N183 Chronic kidney disease, stage 3 (moderate): Secondary | ICD-10-CM | POA: Diagnosis not present

## 2018-11-21 DIAGNOSIS — E1121 Type 2 diabetes mellitus with diabetic nephropathy: Secondary | ICD-10-CM | POA: Diagnosis not present

## 2018-11-21 DIAGNOSIS — R2689 Other abnormalities of gait and mobility: Secondary | ICD-10-CM | POA: Diagnosis not present

## 2018-11-21 DIAGNOSIS — E1140 Type 2 diabetes mellitus with diabetic neuropathy, unspecified: Secondary | ICD-10-CM | POA: Diagnosis not present

## 2018-11-21 DIAGNOSIS — M542 Cervicalgia: Secondary | ICD-10-CM | POA: Diagnosis not present

## 2018-11-21 DIAGNOSIS — G8929 Other chronic pain: Secondary | ICD-10-CM | POA: Diagnosis not present

## 2018-11-21 DIAGNOSIS — K5790 Diverticulosis of intestine, part unspecified, without perforation or abscess without bleeding: Secondary | ICD-10-CM | POA: Diagnosis not present

## 2018-11-21 DIAGNOSIS — I2510 Atherosclerotic heart disease of native coronary artery without angina pectoris: Secondary | ICD-10-CM | POA: Diagnosis not present

## 2018-11-21 DIAGNOSIS — M549 Dorsalgia, unspecified: Secondary | ICD-10-CM | POA: Diagnosis not present

## 2018-11-21 DIAGNOSIS — Z7984 Long term (current) use of oral hypoglycemic drugs: Secondary | ICD-10-CM | POA: Diagnosis not present

## 2018-11-25 DIAGNOSIS — I129 Hypertensive chronic kidney disease with stage 1 through stage 4 chronic kidney disease, or unspecified chronic kidney disease: Secondary | ICD-10-CM | POA: Diagnosis not present

## 2018-11-25 DIAGNOSIS — N183 Chronic kidney disease, stage 3 (moderate): Secondary | ICD-10-CM | POA: Diagnosis not present

## 2018-11-25 DIAGNOSIS — I2510 Atherosclerotic heart disease of native coronary artery without angina pectoris: Secondary | ICD-10-CM | POA: Diagnosis not present

## 2018-11-25 DIAGNOSIS — K5790 Diverticulosis of intestine, part unspecified, without perforation or abscess without bleeding: Secondary | ICD-10-CM | POA: Diagnosis not present

## 2018-11-25 DIAGNOSIS — I480 Paroxysmal atrial fibrillation: Secondary | ICD-10-CM | POA: Diagnosis not present

## 2018-11-25 DIAGNOSIS — E1121 Type 2 diabetes mellitus with diabetic nephropathy: Secondary | ICD-10-CM | POA: Diagnosis not present

## 2018-11-29 DIAGNOSIS — L821 Other seborrheic keratosis: Secondary | ICD-10-CM | POA: Diagnosis not present

## 2018-11-29 DIAGNOSIS — L570 Actinic keratosis: Secondary | ICD-10-CM | POA: Diagnosis not present

## 2018-11-29 DIAGNOSIS — D2339 Other benign neoplasm of skin of other parts of face: Secondary | ICD-10-CM | POA: Diagnosis not present

## 2018-11-29 DIAGNOSIS — Z85828 Personal history of other malignant neoplasm of skin: Secondary | ICD-10-CM | POA: Diagnosis not present

## 2018-11-29 DIAGNOSIS — D485 Neoplasm of uncertain behavior of skin: Secondary | ICD-10-CM | POA: Diagnosis not present

## 2018-11-30 DIAGNOSIS — F330 Major depressive disorder, recurrent, mild: Secondary | ICD-10-CM | POA: Diagnosis not present

## 2018-11-30 DIAGNOSIS — F064 Anxiety disorder due to known physiological condition: Secondary | ICD-10-CM | POA: Diagnosis not present

## 2018-12-02 DIAGNOSIS — N183 Chronic kidney disease, stage 3 (moderate): Secondary | ICD-10-CM | POA: Diagnosis not present

## 2018-12-02 DIAGNOSIS — K5790 Diverticulosis of intestine, part unspecified, without perforation or abscess without bleeding: Secondary | ICD-10-CM | POA: Diagnosis not present

## 2018-12-02 DIAGNOSIS — I2510 Atherosclerotic heart disease of native coronary artery without angina pectoris: Secondary | ICD-10-CM | POA: Diagnosis not present

## 2018-12-02 DIAGNOSIS — E1121 Type 2 diabetes mellitus with diabetic nephropathy: Secondary | ICD-10-CM | POA: Diagnosis not present

## 2018-12-02 DIAGNOSIS — I129 Hypertensive chronic kidney disease with stage 1 through stage 4 chronic kidney disease, or unspecified chronic kidney disease: Secondary | ICD-10-CM | POA: Diagnosis not present

## 2018-12-02 DIAGNOSIS — I480 Paroxysmal atrial fibrillation: Secondary | ICD-10-CM | POA: Diagnosis not present

## 2018-12-06 DIAGNOSIS — Z111 Encounter for screening for respiratory tuberculosis: Secondary | ICD-10-CM | POA: Diagnosis not present

## 2018-12-06 DIAGNOSIS — I480 Paroxysmal atrial fibrillation: Secondary | ICD-10-CM | POA: Diagnosis not present

## 2018-12-06 DIAGNOSIS — E1121 Type 2 diabetes mellitus with diabetic nephropathy: Secondary | ICD-10-CM | POA: Diagnosis not present

## 2018-12-06 DIAGNOSIS — E1140 Type 2 diabetes mellitus with diabetic neuropathy, unspecified: Secondary | ICD-10-CM | POA: Diagnosis not present

## 2018-12-06 DIAGNOSIS — I129 Hypertensive chronic kidney disease with stage 1 through stage 4 chronic kidney disease, or unspecified chronic kidney disease: Secondary | ICD-10-CM | POA: Diagnosis not present

## 2018-12-06 DIAGNOSIS — Z7189 Other specified counseling: Secondary | ICD-10-CM | POA: Diagnosis not present

## 2018-12-06 DIAGNOSIS — N183 Chronic kidney disease, stage 3 (moderate): Secondary | ICD-10-CM | POA: Diagnosis not present

## 2018-12-08 DIAGNOSIS — I129 Hypertensive chronic kidney disease with stage 1 through stage 4 chronic kidney disease, or unspecified chronic kidney disease: Secondary | ICD-10-CM | POA: Diagnosis not present

## 2018-12-08 DIAGNOSIS — I480 Paroxysmal atrial fibrillation: Secondary | ICD-10-CM | POA: Diagnosis not present

## 2018-12-08 DIAGNOSIS — K5790 Diverticulosis of intestine, part unspecified, without perforation or abscess without bleeding: Secondary | ICD-10-CM | POA: Diagnosis not present

## 2018-12-08 DIAGNOSIS — N183 Chronic kidney disease, stage 3 (moderate): Secondary | ICD-10-CM | POA: Diagnosis not present

## 2018-12-08 DIAGNOSIS — E1121 Type 2 diabetes mellitus with diabetic nephropathy: Secondary | ICD-10-CM | POA: Diagnosis not present

## 2018-12-08 DIAGNOSIS — I2510 Atherosclerotic heart disease of native coronary artery without angina pectoris: Secondary | ICD-10-CM | POA: Diagnosis not present

## 2018-12-10 DIAGNOSIS — I129 Hypertensive chronic kidney disease with stage 1 through stage 4 chronic kidney disease, or unspecified chronic kidney disease: Secondary | ICD-10-CM | POA: Diagnosis not present

## 2018-12-10 DIAGNOSIS — E1121 Type 2 diabetes mellitus with diabetic nephropathy: Secondary | ICD-10-CM | POA: Diagnosis not present

## 2018-12-10 DIAGNOSIS — K5790 Diverticulosis of intestine, part unspecified, without perforation or abscess without bleeding: Secondary | ICD-10-CM | POA: Diagnosis not present

## 2018-12-10 DIAGNOSIS — I480 Paroxysmal atrial fibrillation: Secondary | ICD-10-CM | POA: Diagnosis not present

## 2018-12-10 DIAGNOSIS — N183 Chronic kidney disease, stage 3 (moderate): Secondary | ICD-10-CM | POA: Diagnosis not present

## 2018-12-10 DIAGNOSIS — I2510 Atherosclerotic heart disease of native coronary artery without angina pectoris: Secondary | ICD-10-CM | POA: Diagnosis not present

## 2018-12-13 DIAGNOSIS — I129 Hypertensive chronic kidney disease with stage 1 through stage 4 chronic kidney disease, or unspecified chronic kidney disease: Secondary | ICD-10-CM | POA: Diagnosis not present

## 2018-12-13 DIAGNOSIS — I480 Paroxysmal atrial fibrillation: Secondary | ICD-10-CM | POA: Diagnosis not present

## 2018-12-13 DIAGNOSIS — E1121 Type 2 diabetes mellitus with diabetic nephropathy: Secondary | ICD-10-CM | POA: Diagnosis not present

## 2018-12-13 DIAGNOSIS — N183 Chronic kidney disease, stage 3 (moderate): Secondary | ICD-10-CM | POA: Diagnosis not present

## 2018-12-13 DIAGNOSIS — K5790 Diverticulosis of intestine, part unspecified, without perforation or abscess without bleeding: Secondary | ICD-10-CM | POA: Diagnosis not present

## 2018-12-13 DIAGNOSIS — I2510 Atherosclerotic heart disease of native coronary artery without angina pectoris: Secondary | ICD-10-CM | POA: Diagnosis not present

## 2018-12-14 DIAGNOSIS — E119 Type 2 diabetes mellitus without complications: Secondary | ICD-10-CM | POA: Diagnosis not present

## 2018-12-16 DIAGNOSIS — F330 Major depressive disorder, recurrent, mild: Secondary | ICD-10-CM | POA: Diagnosis not present

## 2018-12-16 DIAGNOSIS — F5105 Insomnia due to other mental disorder: Secondary | ICD-10-CM | POA: Diagnosis not present

## 2018-12-16 DIAGNOSIS — F064 Anxiety disorder due to known physiological condition: Secondary | ICD-10-CM | POA: Diagnosis not present

## 2018-12-22 DIAGNOSIS — E559 Vitamin D deficiency, unspecified: Secondary | ICD-10-CM | POA: Diagnosis not present

## 2018-12-22 DIAGNOSIS — Z6827 Body mass index (BMI) 27.0-27.9, adult: Secondary | ICD-10-CM | POA: Diagnosis not present

## 2018-12-22 DIAGNOSIS — E1342 Other specified diabetes mellitus with diabetic polyneuropathy: Secondary | ICD-10-CM | POA: Diagnosis not present

## 2018-12-22 DIAGNOSIS — E7849 Other hyperlipidemia: Secondary | ICD-10-CM | POA: Diagnosis not present

## 2018-12-22 DIAGNOSIS — I25810 Atherosclerosis of coronary artery bypass graft(s) without angina pectoris: Secondary | ICD-10-CM | POA: Diagnosis not present

## 2018-12-22 DIAGNOSIS — M5431 Sciatica, right side: Secondary | ICD-10-CM | POA: Diagnosis not present

## 2018-12-22 DIAGNOSIS — M5432 Sciatica, left side: Secondary | ICD-10-CM | POA: Diagnosis not present

## 2018-12-22 DIAGNOSIS — G8929 Other chronic pain: Secondary | ICD-10-CM | POA: Diagnosis not present

## 2018-12-22 DIAGNOSIS — F324 Major depressive disorder, single episode, in partial remission: Secondary | ICD-10-CM | POA: Diagnosis not present

## 2018-12-22 DIAGNOSIS — I10 Essential (primary) hypertension: Secondary | ICD-10-CM | POA: Diagnosis not present

## 2018-12-22 DIAGNOSIS — D508 Other iron deficiency anemias: Secondary | ICD-10-CM | POA: Diagnosis not present

## 2018-12-24 DIAGNOSIS — M6281 Muscle weakness (generalized): Secondary | ICD-10-CM | POA: Diagnosis not present

## 2018-12-24 DIAGNOSIS — R2689 Other abnormalities of gait and mobility: Secondary | ICD-10-CM | POA: Diagnosis not present

## 2018-12-27 DIAGNOSIS — M6281 Muscle weakness (generalized): Secondary | ICD-10-CM | POA: Diagnosis not present

## 2018-12-27 DIAGNOSIS — R2689 Other abnormalities of gait and mobility: Secondary | ICD-10-CM | POA: Diagnosis not present

## 2018-12-29 DIAGNOSIS — R2689 Other abnormalities of gait and mobility: Secondary | ICD-10-CM | POA: Diagnosis not present

## 2018-12-29 DIAGNOSIS — M6281 Muscle weakness (generalized): Secondary | ICD-10-CM | POA: Diagnosis not present

## 2018-12-31 DIAGNOSIS — R2689 Other abnormalities of gait and mobility: Secondary | ICD-10-CM | POA: Diagnosis not present

## 2018-12-31 DIAGNOSIS — G8929 Other chronic pain: Secondary | ICD-10-CM | POA: Diagnosis not present

## 2018-12-31 DIAGNOSIS — F330 Major depressive disorder, recurrent, mild: Secondary | ICD-10-CM | POA: Diagnosis not present

## 2018-12-31 DIAGNOSIS — R9430 Abnormal result of cardiovascular function study, unspecified: Secondary | ICD-10-CM | POA: Diagnosis not present

## 2018-12-31 DIAGNOSIS — I10 Essential (primary) hypertension: Secondary | ICD-10-CM | POA: Diagnosis not present

## 2018-12-31 DIAGNOSIS — F064 Anxiety disorder due to known physiological condition: Secondary | ICD-10-CM | POA: Diagnosis not present

## 2018-12-31 DIAGNOSIS — E1139 Type 2 diabetes mellitus with other diabetic ophthalmic complication: Secondary | ICD-10-CM | POA: Diagnosis not present

## 2018-12-31 DIAGNOSIS — M6281 Muscle weakness (generalized): Secondary | ICD-10-CM | POA: Diagnosis not present

## 2019-01-03 DIAGNOSIS — R2689 Other abnormalities of gait and mobility: Secondary | ICD-10-CM | POA: Diagnosis not present

## 2019-01-03 DIAGNOSIS — M6281 Muscle weakness (generalized): Secondary | ICD-10-CM | POA: Diagnosis not present

## 2019-01-05 DIAGNOSIS — M6281 Muscle weakness (generalized): Secondary | ICD-10-CM | POA: Diagnosis not present

## 2019-01-05 DIAGNOSIS — R2689 Other abnormalities of gait and mobility: Secondary | ICD-10-CM | POA: Diagnosis not present

## 2019-01-06 DIAGNOSIS — S90852A Superficial foreign body, left foot, initial encounter: Secondary | ICD-10-CM | POA: Diagnosis not present

## 2019-01-06 DIAGNOSIS — G629 Polyneuropathy, unspecified: Secondary | ICD-10-CM | POA: Diagnosis not present

## 2019-01-07 DIAGNOSIS — M6281 Muscle weakness (generalized): Secondary | ICD-10-CM | POA: Diagnosis not present

## 2019-01-07 DIAGNOSIS — E0840 Diabetes mellitus due to underlying condition with diabetic neuropathy, unspecified: Secondary | ICD-10-CM | POA: Diagnosis not present

## 2019-01-07 DIAGNOSIS — R2689 Other abnormalities of gait and mobility: Secondary | ICD-10-CM | POA: Diagnosis not present

## 2019-01-12 DIAGNOSIS — I25810 Atherosclerosis of coronary artery bypass graft(s) without angina pectoris: Secondary | ICD-10-CM | POA: Diagnosis not present

## 2019-01-12 DIAGNOSIS — E559 Vitamin D deficiency, unspecified: Secondary | ICD-10-CM | POA: Diagnosis not present

## 2019-01-12 DIAGNOSIS — E7849 Other hyperlipidemia: Secondary | ICD-10-CM | POA: Diagnosis not present

## 2019-01-12 DIAGNOSIS — E1342 Other specified diabetes mellitus with diabetic polyneuropathy: Secondary | ICD-10-CM | POA: Diagnosis not present

## 2019-01-12 DIAGNOSIS — I10 Essential (primary) hypertension: Secondary | ICD-10-CM | POA: Diagnosis not present

## 2019-01-12 DIAGNOSIS — D508 Other iron deficiency anemias: Secondary | ICD-10-CM | POA: Diagnosis not present

## 2019-01-13 DIAGNOSIS — M6281 Muscle weakness (generalized): Secondary | ICD-10-CM | POA: Diagnosis not present

## 2019-01-13 DIAGNOSIS — R2689 Other abnormalities of gait and mobility: Secondary | ICD-10-CM | POA: Diagnosis not present

## 2019-01-14 DIAGNOSIS — R2689 Other abnormalities of gait and mobility: Secondary | ICD-10-CM | POA: Diagnosis not present

## 2019-01-14 DIAGNOSIS — M6281 Muscle weakness (generalized): Secondary | ICD-10-CM | POA: Diagnosis not present

## 2019-01-14 DIAGNOSIS — E0840 Diabetes mellitus due to underlying condition with diabetic neuropathy, unspecified: Secondary | ICD-10-CM | POA: Diagnosis not present

## 2019-01-17 DIAGNOSIS — E0840 Diabetes mellitus due to underlying condition with diabetic neuropathy, unspecified: Secondary | ICD-10-CM | POA: Diagnosis not present

## 2019-01-17 DIAGNOSIS — M6281 Muscle weakness (generalized): Secondary | ICD-10-CM | POA: Diagnosis not present

## 2019-01-17 DIAGNOSIS — R2689 Other abnormalities of gait and mobility: Secondary | ICD-10-CM | POA: Diagnosis not present

## 2019-01-20 DIAGNOSIS — G8929 Other chronic pain: Secondary | ICD-10-CM | POA: Diagnosis not present

## 2019-01-20 DIAGNOSIS — G2581 Restless legs syndrome: Secondary | ICD-10-CM | POA: Diagnosis not present

## 2019-01-20 DIAGNOSIS — M6281 Muscle weakness (generalized): Secondary | ICD-10-CM | POA: Diagnosis not present

## 2019-01-20 DIAGNOSIS — R2689 Other abnormalities of gait and mobility: Secondary | ICD-10-CM | POA: Diagnosis not present

## 2019-01-20 DIAGNOSIS — I10 Essential (primary) hypertension: Secondary | ICD-10-CM | POA: Diagnosis not present

## 2019-01-20 DIAGNOSIS — G629 Polyneuropathy, unspecified: Secondary | ICD-10-CM | POA: Diagnosis not present

## 2019-01-22 DIAGNOSIS — M6281 Muscle weakness (generalized): Secondary | ICD-10-CM | POA: Diagnosis not present

## 2019-01-22 DIAGNOSIS — R2689 Other abnormalities of gait and mobility: Secondary | ICD-10-CM | POA: Diagnosis not present

## 2019-01-25 DIAGNOSIS — M6281 Muscle weakness (generalized): Secondary | ICD-10-CM | POA: Diagnosis not present

## 2019-01-25 DIAGNOSIS — R2689 Other abnormalities of gait and mobility: Secondary | ICD-10-CM | POA: Diagnosis not present

## 2019-01-27 DIAGNOSIS — G2581 Restless legs syndrome: Secondary | ICD-10-CM | POA: Diagnosis not present

## 2019-01-27 DIAGNOSIS — G43009 Migraine without aura, not intractable, without status migrainosus: Secondary | ICD-10-CM | POA: Diagnosis not present

## 2019-01-27 DIAGNOSIS — I10 Essential (primary) hypertension: Secondary | ICD-10-CM | POA: Diagnosis not present

## 2019-01-27 DIAGNOSIS — G629 Polyneuropathy, unspecified: Secondary | ICD-10-CM | POA: Diagnosis not present

## 2019-01-28 DIAGNOSIS — M6281 Muscle weakness (generalized): Secondary | ICD-10-CM | POA: Diagnosis not present

## 2019-01-28 DIAGNOSIS — R2689 Other abnormalities of gait and mobility: Secondary | ICD-10-CM | POA: Diagnosis not present

## 2019-01-29 DIAGNOSIS — I10 Essential (primary) hypertension: Secondary | ICD-10-CM | POA: Diagnosis not present

## 2019-01-29 DIAGNOSIS — F324 Major depressive disorder, single episode, in partial remission: Secondary | ICD-10-CM | POA: Diagnosis not present

## 2019-01-29 DIAGNOSIS — G8929 Other chronic pain: Secondary | ICD-10-CM | POA: Diagnosis not present

## 2019-01-31 DIAGNOSIS — R2689 Other abnormalities of gait and mobility: Secondary | ICD-10-CM | POA: Diagnosis not present

## 2019-01-31 DIAGNOSIS — M6281 Muscle weakness (generalized): Secondary | ICD-10-CM | POA: Diagnosis not present

## 2019-01-31 DIAGNOSIS — E0840 Diabetes mellitus due to underlying condition with diabetic neuropathy, unspecified: Secondary | ICD-10-CM | POA: Diagnosis not present

## 2019-02-01 DIAGNOSIS — M6281 Muscle weakness (generalized): Secondary | ICD-10-CM | POA: Diagnosis not present

## 2019-02-01 DIAGNOSIS — R2689 Other abnormalities of gait and mobility: Secondary | ICD-10-CM | POA: Diagnosis not present

## 2019-02-02 DIAGNOSIS — E0840 Diabetes mellitus due to underlying condition with diabetic neuropathy, unspecified: Secondary | ICD-10-CM | POA: Diagnosis not present

## 2019-02-02 DIAGNOSIS — R2689 Other abnormalities of gait and mobility: Secondary | ICD-10-CM | POA: Diagnosis not present

## 2019-02-02 DIAGNOSIS — M6281 Muscle weakness (generalized): Secondary | ICD-10-CM | POA: Diagnosis not present

## 2019-02-03 DIAGNOSIS — F5101 Primary insomnia: Secondary | ICD-10-CM | POA: Diagnosis not present

## 2019-02-03 DIAGNOSIS — G4709 Other insomnia: Secondary | ICD-10-CM | POA: Diagnosis not present

## 2019-02-03 DIAGNOSIS — F3340 Major depressive disorder, recurrent, in remission, unspecified: Secondary | ICD-10-CM | POA: Diagnosis not present

## 2019-02-03 DIAGNOSIS — E1121 Type 2 diabetes mellitus with diabetic nephropathy: Secondary | ICD-10-CM | POA: Diagnosis not present

## 2019-02-03 DIAGNOSIS — E7800 Pure hypercholesterolemia, unspecified: Secondary | ICD-10-CM | POA: Diagnosis not present

## 2019-02-03 DIAGNOSIS — G894 Chronic pain syndrome: Secondary | ICD-10-CM | POA: Diagnosis not present

## 2019-02-03 DIAGNOSIS — G629 Polyneuropathy, unspecified: Secondary | ICD-10-CM | POA: Diagnosis not present

## 2019-02-03 DIAGNOSIS — R2689 Other abnormalities of gait and mobility: Secondary | ICD-10-CM | POA: Diagnosis not present

## 2019-02-03 DIAGNOSIS — M6281 Muscle weakness (generalized): Secondary | ICD-10-CM | POA: Diagnosis not present

## 2019-02-03 DIAGNOSIS — I10 Essential (primary) hypertension: Secondary | ICD-10-CM | POA: Diagnosis not present

## 2019-02-03 DIAGNOSIS — K5903 Drug induced constipation: Secondary | ICD-10-CM | POA: Diagnosis not present

## 2019-02-04 DIAGNOSIS — E0840 Diabetes mellitus due to underlying condition with diabetic neuropathy, unspecified: Secondary | ICD-10-CM | POA: Diagnosis not present

## 2019-02-04 DIAGNOSIS — R2689 Other abnormalities of gait and mobility: Secondary | ICD-10-CM | POA: Diagnosis not present

## 2019-02-04 DIAGNOSIS — M6281 Muscle weakness (generalized): Secondary | ICD-10-CM | POA: Diagnosis not present

## 2019-02-07 DIAGNOSIS — E0840 Diabetes mellitus due to underlying condition with diabetic neuropathy, unspecified: Secondary | ICD-10-CM | POA: Diagnosis not present

## 2019-02-07 DIAGNOSIS — M6281 Muscle weakness (generalized): Secondary | ICD-10-CM | POA: Diagnosis not present

## 2019-02-07 DIAGNOSIS — R2689 Other abnormalities of gait and mobility: Secondary | ICD-10-CM | POA: Diagnosis not present

## 2019-02-08 DIAGNOSIS — M6281 Muscle weakness (generalized): Secondary | ICD-10-CM | POA: Diagnosis not present

## 2019-02-08 DIAGNOSIS — R2689 Other abnormalities of gait and mobility: Secondary | ICD-10-CM | POA: Diagnosis not present

## 2019-02-09 DIAGNOSIS — M6281 Muscle weakness (generalized): Secondary | ICD-10-CM | POA: Diagnosis not present

## 2019-02-09 DIAGNOSIS — R2689 Other abnormalities of gait and mobility: Secondary | ICD-10-CM | POA: Diagnosis not present

## 2019-02-09 DIAGNOSIS — E0840 Diabetes mellitus due to underlying condition with diabetic neuropathy, unspecified: Secondary | ICD-10-CM | POA: Diagnosis not present

## 2019-02-10 DIAGNOSIS — R2689 Other abnormalities of gait and mobility: Secondary | ICD-10-CM | POA: Diagnosis not present

## 2019-02-10 DIAGNOSIS — G8929 Other chronic pain: Secondary | ICD-10-CM | POA: Diagnosis not present

## 2019-02-10 DIAGNOSIS — M6281 Muscle weakness (generalized): Secondary | ICD-10-CM | POA: Diagnosis not present

## 2019-02-10 DIAGNOSIS — I10 Essential (primary) hypertension: Secondary | ICD-10-CM | POA: Diagnosis not present

## 2019-02-10 DIAGNOSIS — G4709 Other insomnia: Secondary | ICD-10-CM | POA: Diagnosis not present

## 2019-02-10 DIAGNOSIS — K5903 Drug induced constipation: Secondary | ICD-10-CM | POA: Diagnosis not present

## 2019-02-14 DIAGNOSIS — M6281 Muscle weakness (generalized): Secondary | ICD-10-CM | POA: Diagnosis not present

## 2019-02-14 DIAGNOSIS — E0840 Diabetes mellitus due to underlying condition with diabetic neuropathy, unspecified: Secondary | ICD-10-CM | POA: Diagnosis not present

## 2019-02-14 DIAGNOSIS — R2689 Other abnormalities of gait and mobility: Secondary | ICD-10-CM | POA: Diagnosis not present

## 2019-02-17 DIAGNOSIS — G8929 Other chronic pain: Secondary | ICD-10-CM | POA: Diagnosis not present

## 2019-02-17 DIAGNOSIS — F324 Major depressive disorder, single episode, in partial remission: Secondary | ICD-10-CM | POA: Diagnosis not present

## 2019-02-17 DIAGNOSIS — I10 Essential (primary) hypertension: Secondary | ICD-10-CM | POA: Diagnosis not present

## 2019-02-17 DIAGNOSIS — K5903 Drug induced constipation: Secondary | ICD-10-CM | POA: Diagnosis not present

## 2019-03-03 DIAGNOSIS — E1121 Type 2 diabetes mellitus with diabetic nephropathy: Secondary | ICD-10-CM | POA: Diagnosis not present

## 2019-03-03 DIAGNOSIS — K219 Gastro-esophageal reflux disease without esophagitis: Secondary | ICD-10-CM | POA: Diagnosis not present

## 2019-03-03 DIAGNOSIS — F3340 Major depressive disorder, recurrent, in remission, unspecified: Secondary | ICD-10-CM | POA: Diagnosis not present

## 2019-03-03 DIAGNOSIS — G8929 Other chronic pain: Secondary | ICD-10-CM | POA: Diagnosis not present

## 2019-03-31 DIAGNOSIS — E1129 Type 2 diabetes mellitus with other diabetic kidney complication: Secondary | ICD-10-CM | POA: Diagnosis not present

## 2019-03-31 DIAGNOSIS — F324 Major depressive disorder, single episode, in partial remission: Secondary | ICD-10-CM | POA: Diagnosis not present

## 2019-03-31 DIAGNOSIS — R1310 Dysphagia, unspecified: Secondary | ICD-10-CM | POA: Diagnosis not present

## 2019-04-04 DIAGNOSIS — G629 Polyneuropathy, unspecified: Secondary | ICD-10-CM | POA: Diagnosis not present

## 2019-04-04 DIAGNOSIS — E1121 Type 2 diabetes mellitus with diabetic nephropathy: Secondary | ICD-10-CM | POA: Diagnosis not present

## 2019-04-06 DIAGNOSIS — M25572 Pain in left ankle and joints of left foot: Secondary | ICD-10-CM | POA: Diagnosis not present

## 2019-04-07 DIAGNOSIS — R58 Hemorrhage, not elsewhere classified: Secondary | ICD-10-CM | POA: Diagnosis not present

## 2019-04-07 DIAGNOSIS — I10 Essential (primary) hypertension: Secondary | ICD-10-CM | POA: Diagnosis not present

## 2019-04-07 DIAGNOSIS — G894 Chronic pain syndrome: Secondary | ICD-10-CM | POA: Diagnosis not present

## 2019-04-07 DIAGNOSIS — E1121 Type 2 diabetes mellitus with diabetic nephropathy: Secondary | ICD-10-CM | POA: Diagnosis not present

## 2019-04-29 DIAGNOSIS — K5903 Drug induced constipation: Secondary | ICD-10-CM | POA: Diagnosis not present

## 2019-04-29 DIAGNOSIS — G8929 Other chronic pain: Secondary | ICD-10-CM | POA: Diagnosis not present

## 2019-04-29 DIAGNOSIS — I10 Essential (primary) hypertension: Secondary | ICD-10-CM | POA: Diagnosis not present

## 2019-04-29 DIAGNOSIS — F3340 Major depressive disorder, recurrent, in remission, unspecified: Secondary | ICD-10-CM | POA: Diagnosis not present

## 2019-04-29 DIAGNOSIS — G629 Polyneuropathy, unspecified: Secondary | ICD-10-CM | POA: Diagnosis not present

## 2019-05-01 DIAGNOSIS — I509 Heart failure, unspecified: Secondary | ICD-10-CM | POA: Diagnosis not present

## 2019-05-01 DIAGNOSIS — F324 Major depressive disorder, single episode, in partial remission: Secondary | ICD-10-CM | POA: Diagnosis not present

## 2019-05-01 DIAGNOSIS — W19XXXA Unspecified fall, initial encounter: Secondary | ICD-10-CM | POA: Diagnosis not present

## 2019-05-01 DIAGNOSIS — G894 Chronic pain syndrome: Secondary | ICD-10-CM | POA: Diagnosis not present

## 2019-05-01 DIAGNOSIS — E039 Hypothyroidism, unspecified: Secondary | ICD-10-CM | POA: Diagnosis not present

## 2019-05-01 DIAGNOSIS — E1121 Type 2 diabetes mellitus with diabetic nephropathy: Secondary | ICD-10-CM | POA: Diagnosis not present

## 2019-05-17 ENCOUNTER — Telehealth: Payer: Self-pay | Admitting: *Deleted

## 2019-05-17 NOTE — Telephone Encounter (Signed)
A message was left, re: follow up visit. 

## 2019-05-31 DIAGNOSIS — G894 Chronic pain syndrome: Secondary | ICD-10-CM | POA: Diagnosis not present

## 2019-05-31 DIAGNOSIS — F3340 Major depressive disorder, recurrent, in remission, unspecified: Secondary | ICD-10-CM | POA: Diagnosis not present

## 2019-05-31 DIAGNOSIS — K219 Gastro-esophageal reflux disease without esophagitis: Secondary | ICD-10-CM | POA: Diagnosis not present

## 2019-05-31 DIAGNOSIS — I10 Essential (primary) hypertension: Secondary | ICD-10-CM | POA: Diagnosis not present

## 2019-06-07 DIAGNOSIS — G8929 Other chronic pain: Secondary | ICD-10-CM | POA: Diagnosis not present

## 2019-06-07 DIAGNOSIS — G894 Chronic pain syndrome: Secondary | ICD-10-CM | POA: Diagnosis not present

## 2019-06-07 DIAGNOSIS — I10 Essential (primary) hypertension: Secondary | ICD-10-CM | POA: Diagnosis not present

## 2019-06-07 DIAGNOSIS — M25552 Pain in left hip: Secondary | ICD-10-CM | POA: Diagnosis not present

## 2019-06-28 DIAGNOSIS — R29898 Other symptoms and signs involving the musculoskeletal system: Secondary | ICD-10-CM | POA: Diagnosis not present

## 2019-06-28 DIAGNOSIS — I10 Essential (primary) hypertension: Secondary | ICD-10-CM | POA: Diagnosis not present

## 2019-06-28 DIAGNOSIS — M25552 Pain in left hip: Secondary | ICD-10-CM | POA: Diagnosis not present

## 2019-06-28 DIAGNOSIS — G8929 Other chronic pain: Secondary | ICD-10-CM | POA: Diagnosis not present

## 2019-06-28 DIAGNOSIS — S8012XA Contusion of left lower leg, initial encounter: Secondary | ICD-10-CM | POA: Diagnosis not present

## 2019-07-01 DIAGNOSIS — N390 Urinary tract infection, site not specified: Secondary | ICD-10-CM | POA: Diagnosis not present

## 2019-07-01 DIAGNOSIS — E1129 Type 2 diabetes mellitus with other diabetic kidney complication: Secondary | ICD-10-CM | POA: Diagnosis not present

## 2019-07-01 DIAGNOSIS — F3289 Other specified depressive episodes: Secondary | ICD-10-CM | POA: Diagnosis not present

## 2019-07-01 DIAGNOSIS — I10 Essential (primary) hypertension: Secondary | ICD-10-CM | POA: Diagnosis not present

## 2019-07-01 DIAGNOSIS — I509 Heart failure, unspecified: Secondary | ICD-10-CM | POA: Diagnosis not present

## 2019-07-01 DIAGNOSIS — G8929 Other chronic pain: Secondary | ICD-10-CM | POA: Diagnosis not present

## 2019-08-01 DIAGNOSIS — I10 Essential (primary) hypertension: Secondary | ICD-10-CM | POA: Diagnosis not present

## 2019-08-01 DIAGNOSIS — F324 Major depressive disorder, single episode, in partial remission: Secondary | ICD-10-CM | POA: Diagnosis not present

## 2019-08-01 DIAGNOSIS — W19XXXA Unspecified fall, initial encounter: Secondary | ICD-10-CM | POA: Diagnosis not present

## 2019-08-01 DIAGNOSIS — E1129 Type 2 diabetes mellitus with other diabetic kidney complication: Secondary | ICD-10-CM | POA: Diagnosis not present

## 2019-08-01 DIAGNOSIS — G8929 Other chronic pain: Secondary | ICD-10-CM | POA: Diagnosis not present

## 2019-08-12 DIAGNOSIS — E1121 Type 2 diabetes mellitus with diabetic nephropathy: Secondary | ICD-10-CM | POA: Diagnosis not present

## 2019-08-12 DIAGNOSIS — G8929 Other chronic pain: Secondary | ICD-10-CM | POA: Diagnosis not present

## 2019-08-12 DIAGNOSIS — I10 Essential (primary) hypertension: Secondary | ICD-10-CM | POA: Diagnosis not present

## 2019-08-30 DIAGNOSIS — H04123 Dry eye syndrome of bilateral lacrimal glands: Secondary | ICD-10-CM | POA: Diagnosis not present

## 2019-08-30 DIAGNOSIS — H353132 Nonexudative age-related macular degeneration, bilateral, intermediate dry stage: Secondary | ICD-10-CM | POA: Diagnosis not present

## 2019-08-30 DIAGNOSIS — E119 Type 2 diabetes mellitus without complications: Secondary | ICD-10-CM | POA: Diagnosis not present

## 2019-08-30 DIAGNOSIS — H5213 Myopia, bilateral: Secondary | ICD-10-CM | POA: Diagnosis not present

## 2019-08-30 DIAGNOSIS — H5711 Ocular pain, right eye: Secondary | ICD-10-CM | POA: Diagnosis not present

## 2019-08-31 DIAGNOSIS — F329 Major depressive disorder, single episode, unspecified: Secondary | ICD-10-CM | POA: Diagnosis not present

## 2019-08-31 DIAGNOSIS — W19XXXA Unspecified fall, initial encounter: Secondary | ICD-10-CM | POA: Diagnosis not present

## 2019-08-31 DIAGNOSIS — G8929 Other chronic pain: Secondary | ICD-10-CM | POA: Diagnosis not present

## 2019-09-21 DIAGNOSIS — M545 Low back pain: Secondary | ICD-10-CM | POA: Diagnosis not present

## 2019-09-21 DIAGNOSIS — M6281 Muscle weakness (generalized): Secondary | ICD-10-CM | POA: Diagnosis not present

## 2019-09-27 DIAGNOSIS — E1121 Type 2 diabetes mellitus with diabetic nephropathy: Secondary | ICD-10-CM | POA: Diagnosis not present

## 2019-09-27 DIAGNOSIS — I10 Essential (primary) hypertension: Secondary | ICD-10-CM | POA: Diagnosis not present

## 2019-09-27 DIAGNOSIS — M545 Low back pain: Secondary | ICD-10-CM | POA: Diagnosis not present

## 2019-09-27 DIAGNOSIS — M6281 Muscle weakness (generalized): Secondary | ICD-10-CM | POA: Diagnosis not present

## 2019-09-27 DIAGNOSIS — R300 Dysuria: Secondary | ICD-10-CM | POA: Diagnosis not present

## 2019-09-28 DIAGNOSIS — R296 Repeated falls: Secondary | ICD-10-CM | POA: Diagnosis not present

## 2019-09-28 DIAGNOSIS — M6281 Muscle weakness (generalized): Secondary | ICD-10-CM | POA: Diagnosis not present

## 2019-09-29 DIAGNOSIS — M6281 Muscle weakness (generalized): Secondary | ICD-10-CM | POA: Diagnosis not present

## 2019-09-29 DIAGNOSIS — M545 Low back pain: Secondary | ICD-10-CM | POA: Diagnosis not present

## 2019-10-03 DIAGNOSIS — R296 Repeated falls: Secondary | ICD-10-CM | POA: Diagnosis not present

## 2019-10-03 DIAGNOSIS — E1121 Type 2 diabetes mellitus with diabetic nephropathy: Secondary | ICD-10-CM | POA: Diagnosis not present

## 2019-10-03 DIAGNOSIS — I10 Essential (primary) hypertension: Secondary | ICD-10-CM | POA: Diagnosis not present

## 2019-10-03 DIAGNOSIS — M6281 Muscle weakness (generalized): Secondary | ICD-10-CM | POA: Diagnosis not present

## 2019-10-04 DIAGNOSIS — M545 Low back pain: Secondary | ICD-10-CM | POA: Diagnosis not present

## 2019-10-04 DIAGNOSIS — M6281 Muscle weakness (generalized): Secondary | ICD-10-CM | POA: Diagnosis not present

## 2019-10-05 DIAGNOSIS — G629 Polyneuropathy, unspecified: Secondary | ICD-10-CM | POA: Diagnosis not present

## 2019-10-05 DIAGNOSIS — E1121 Type 2 diabetes mellitus with diabetic nephropathy: Secondary | ICD-10-CM | POA: Diagnosis not present

## 2019-10-05 DIAGNOSIS — I10 Essential (primary) hypertension: Secondary | ICD-10-CM | POA: Diagnosis not present

## 2019-10-07 DIAGNOSIS — M6281 Muscle weakness (generalized): Secondary | ICD-10-CM | POA: Diagnosis not present

## 2019-10-07 DIAGNOSIS — R296 Repeated falls: Secondary | ICD-10-CM | POA: Diagnosis not present

## 2019-10-08 DIAGNOSIS — D6869 Other thrombophilia: Secondary | ICD-10-CM | POA: Diagnosis not present

## 2019-10-08 DIAGNOSIS — E860 Dehydration: Secondary | ICD-10-CM | POA: Diagnosis present

## 2019-10-08 DIAGNOSIS — F324 Major depressive disorder, single episode, in partial remission: Secondary | ICD-10-CM | POA: Diagnosis present

## 2019-10-08 DIAGNOSIS — G9341 Metabolic encephalopathy: Secondary | ICD-10-CM | POA: Diagnosis not present

## 2019-10-08 DIAGNOSIS — Z66 Do not resuscitate: Secondary | ICD-10-CM | POA: Diagnosis present

## 2019-10-08 DIAGNOSIS — G92 Toxic encephalopathy: Secondary | ICD-10-CM | POA: Diagnosis not present

## 2019-10-08 DIAGNOSIS — E1121 Type 2 diabetes mellitus with diabetic nephropathy: Secondary | ICD-10-CM | POA: Diagnosis present

## 2019-10-08 DIAGNOSIS — D7389 Other diseases of spleen: Secondary | ICD-10-CM | POA: Diagnosis not present

## 2019-10-08 DIAGNOSIS — R9431 Abnormal electrocardiogram [ECG] [EKG]: Secondary | ICD-10-CM | POA: Diagnosis not present

## 2019-10-08 DIAGNOSIS — G2581 Restless legs syndrome: Secondary | ICD-10-CM | POA: Diagnosis present

## 2019-10-08 DIAGNOSIS — T424X5A Adverse effect of benzodiazepines, initial encounter: Secondary | ICD-10-CM | POA: Diagnosis present

## 2019-10-08 DIAGNOSIS — I10 Essential (primary) hypertension: Secondary | ICD-10-CM | POA: Diagnosis present

## 2019-10-08 DIAGNOSIS — I480 Paroxysmal atrial fibrillation: Secondary | ICD-10-CM | POA: Diagnosis present

## 2019-10-08 DIAGNOSIS — E785 Hyperlipidemia, unspecified: Secondary | ICD-10-CM | POA: Diagnosis present

## 2019-10-08 DIAGNOSIS — J9811 Atelectasis: Secondary | ICD-10-CM | POA: Diagnosis not present

## 2019-10-08 DIAGNOSIS — N179 Acute kidney failure, unspecified: Secondary | ICD-10-CM | POA: Diagnosis not present

## 2019-10-08 DIAGNOSIS — E1140 Type 2 diabetes mellitus with diabetic neuropathy, unspecified: Secondary | ICD-10-CM | POA: Diagnosis present

## 2019-10-08 DIAGNOSIS — E871 Hypo-osmolality and hyponatremia: Secondary | ICD-10-CM | POA: Diagnosis not present

## 2019-10-08 DIAGNOSIS — T426X5A Adverse effect of other antiepileptic and sedative-hypnotic drugs, initial encounter: Secondary | ICD-10-CM | POA: Diagnosis present

## 2019-10-08 DIAGNOSIS — I2510 Atherosclerotic heart disease of native coronary artery without angina pectoris: Secondary | ICD-10-CM | POA: Diagnosis present

## 2019-10-08 DIAGNOSIS — L89151 Pressure ulcer of sacral region, stage 1: Secondary | ICD-10-CM | POA: Diagnosis present

## 2019-10-08 DIAGNOSIS — N390 Urinary tract infection, site not specified: Secondary | ICD-10-CM | POA: Diagnosis not present

## 2019-10-08 DIAGNOSIS — A419 Sepsis, unspecified organism: Secondary | ICD-10-CM | POA: Diagnosis not present

## 2019-10-08 DIAGNOSIS — G8929 Other chronic pain: Secondary | ICD-10-CM | POA: Diagnosis present

## 2019-10-08 DIAGNOSIS — N3000 Acute cystitis without hematuria: Secondary | ICD-10-CM | POA: Diagnosis not present

## 2019-10-08 DIAGNOSIS — K219 Gastro-esophageal reflux disease without esophagitis: Secondary | ICD-10-CM | POA: Diagnosis present

## 2019-10-08 DIAGNOSIS — F5101 Primary insomnia: Secondary | ICD-10-CM | POA: Diagnosis present

## 2019-10-08 DIAGNOSIS — R6520 Severe sepsis without septic shock: Secondary | ICD-10-CM | POA: Diagnosis not present

## 2019-10-08 DIAGNOSIS — K753 Granulomatous hepatitis, not elsewhere classified: Secondary | ICD-10-CM | POA: Diagnosis not present

## 2019-10-13 DIAGNOSIS — I10 Essential (primary) hypertension: Secondary | ICD-10-CM | POA: Diagnosis not present

## 2019-10-13 DIAGNOSIS — N3000 Acute cystitis without hematuria: Secondary | ICD-10-CM | POA: Diagnosis not present

## 2019-10-13 DIAGNOSIS — A419 Sepsis, unspecified organism: Secondary | ICD-10-CM | POA: Diagnosis not present

## 2019-10-13 DIAGNOSIS — I480 Paroxysmal atrial fibrillation: Secondary | ICD-10-CM | POA: Diagnosis not present

## 2019-10-13 DIAGNOSIS — R6520 Severe sepsis without septic shock: Secondary | ICD-10-CM | POA: Diagnosis not present

## 2019-10-17 DIAGNOSIS — R296 Repeated falls: Secondary | ICD-10-CM | POA: Diagnosis not present

## 2019-10-17 DIAGNOSIS — M6281 Muscle weakness (generalized): Secondary | ICD-10-CM | POA: Diagnosis not present

## 2019-10-18 DIAGNOSIS — I69398 Other sequelae of cerebral infarction: Secondary | ICD-10-CM | POA: Diagnosis not present

## 2019-10-18 DIAGNOSIS — R269 Unspecified abnormalities of gait and mobility: Secondary | ICD-10-CM | POA: Diagnosis not present

## 2019-10-18 DIAGNOSIS — G894 Chronic pain syndrome: Secondary | ICD-10-CM | POA: Diagnosis not present

## 2019-10-18 DIAGNOSIS — I2510 Atherosclerotic heart disease of native coronary artery without angina pectoris: Secondary | ICD-10-CM | POA: Diagnosis present

## 2019-10-18 DIAGNOSIS — E1165 Type 2 diabetes mellitus with hyperglycemia: Secondary | ICD-10-CM | POA: Diagnosis not present

## 2019-10-18 DIAGNOSIS — R0602 Shortness of breath: Secondary | ICD-10-CM | POA: Diagnosis not present

## 2019-10-18 DIAGNOSIS — I63512 Cerebral infarction due to unspecified occlusion or stenosis of left middle cerebral artery: Secondary | ICD-10-CM | POA: Diagnosis not present

## 2019-10-18 DIAGNOSIS — R279 Unspecified lack of coordination: Secondary | ICD-10-CM | POA: Diagnosis not present

## 2019-10-18 DIAGNOSIS — N189 Chronic kidney disease, unspecified: Secondary | ICD-10-CM | POA: Diagnosis present

## 2019-10-18 DIAGNOSIS — E1169 Type 2 diabetes mellitus with other specified complication: Secondary | ICD-10-CM | POA: Diagnosis not present

## 2019-10-18 DIAGNOSIS — R404 Transient alteration of awareness: Secondary | ICD-10-CM | POA: Diagnosis not present

## 2019-10-18 DIAGNOSIS — E1122 Type 2 diabetes mellitus with diabetic chronic kidney disease: Secondary | ICD-10-CM | POA: Diagnosis not present

## 2019-10-18 DIAGNOSIS — G9340 Encephalopathy, unspecified: Secondary | ICD-10-CM | POA: Diagnosis not present

## 2019-10-18 DIAGNOSIS — W19XXXA Unspecified fall, initial encounter: Secondary | ICD-10-CM | POA: Diagnosis not present

## 2019-10-18 DIAGNOSIS — R4701 Aphasia: Secondary | ICD-10-CM | POA: Diagnosis present

## 2019-10-18 DIAGNOSIS — D649 Anemia, unspecified: Secondary | ICD-10-CM | POA: Diagnosis not present

## 2019-10-18 DIAGNOSIS — E872 Acidosis: Secondary | ICD-10-CM | POA: Diagnosis not present

## 2019-10-18 DIAGNOSIS — I1311 Hypertensive heart and chronic kidney disease without heart failure, with stage 5 chronic kidney disease, or end stage renal disease: Secondary | ICD-10-CM | POA: Diagnosis not present

## 2019-10-18 DIAGNOSIS — D631 Anemia in chronic kidney disease: Secondary | ICD-10-CM | POA: Diagnosis not present

## 2019-10-18 DIAGNOSIS — N170 Acute kidney failure with tubular necrosis: Secondary | ICD-10-CM | POA: Diagnosis not present

## 2019-10-18 DIAGNOSIS — N3000 Acute cystitis without hematuria: Secondary | ICD-10-CM | POA: Diagnosis not present

## 2019-10-18 DIAGNOSIS — G9349 Other encephalopathy: Secondary | ICD-10-CM | POA: Diagnosis not present

## 2019-10-18 DIAGNOSIS — I63412 Cerebral infarction due to embolism of left middle cerebral artery: Secondary | ICD-10-CM | POA: Diagnosis not present

## 2019-10-18 DIAGNOSIS — G9341 Metabolic encephalopathy: Secondary | ICD-10-CM | POA: Diagnosis not present

## 2019-10-18 DIAGNOSIS — E1139 Type 2 diabetes mellitus with other diabetic ophthalmic complication: Secondary | ICD-10-CM | POA: Diagnosis present

## 2019-10-18 DIAGNOSIS — D6869 Other thrombophilia: Secondary | ICD-10-CM | POA: Diagnosis not present

## 2019-10-18 DIAGNOSIS — N179 Acute kidney failure, unspecified: Secondary | ICD-10-CM | POA: Diagnosis not present

## 2019-10-18 DIAGNOSIS — I509 Heart failure, unspecified: Secondary | ICD-10-CM | POA: Diagnosis not present

## 2019-10-18 DIAGNOSIS — R0600 Dyspnea, unspecified: Secondary | ICD-10-CM | POA: Diagnosis not present

## 2019-10-18 DIAGNOSIS — I480 Paroxysmal atrial fibrillation: Secondary | ICD-10-CM | POA: Diagnosis present

## 2019-10-18 DIAGNOSIS — R471 Dysarthria and anarthria: Secondary | ICD-10-CM | POA: Diagnosis not present

## 2019-10-18 DIAGNOSIS — Z515 Encounter for palliative care: Secondary | ICD-10-CM | POA: Diagnosis not present

## 2019-10-18 DIAGNOSIS — M21371 Foot drop, right foot: Secondary | ICD-10-CM | POA: Diagnosis not present

## 2019-10-18 DIAGNOSIS — E871 Hypo-osmolality and hyponatremia: Secondary | ICD-10-CM | POA: Diagnosis not present

## 2019-10-18 DIAGNOSIS — R062 Wheezing: Secondary | ICD-10-CM | POA: Diagnosis not present

## 2019-10-18 DIAGNOSIS — R296 Repeated falls: Secondary | ICD-10-CM | POA: Diagnosis present

## 2019-10-18 DIAGNOSIS — E1140 Type 2 diabetes mellitus with diabetic neuropathy, unspecified: Secondary | ICD-10-CM | POA: Diagnosis not present

## 2019-10-18 DIAGNOSIS — Z8669 Personal history of other diseases of the nervous system and sense organs: Secondary | ICD-10-CM | POA: Diagnosis not present

## 2019-10-18 DIAGNOSIS — I158 Other secondary hypertension: Secondary | ICD-10-CM | POA: Diagnosis not present

## 2019-10-18 DIAGNOSIS — Z8679 Personal history of other diseases of the circulatory system: Secondary | ICD-10-CM | POA: Diagnosis not present

## 2019-10-18 DIAGNOSIS — Z20828 Contact with and (suspected) exposure to other viral communicable diseases: Secondary | ICD-10-CM | POA: Diagnosis present

## 2019-10-18 DIAGNOSIS — I130 Hypertensive heart and chronic kidney disease with heart failure and stage 1 through stage 4 chronic kidney disease, or unspecified chronic kidney disease: Secondary | ICD-10-CM | POA: Diagnosis not present

## 2019-10-18 DIAGNOSIS — I081 Rheumatic disorders of both mitral and tricuspid valves: Secondary | ICD-10-CM | POA: Diagnosis present

## 2019-10-18 DIAGNOSIS — R29818 Other symptoms and signs involving the nervous system: Secondary | ICD-10-CM | POA: Diagnosis not present

## 2019-10-18 DIAGNOSIS — I4891 Unspecified atrial fibrillation: Secondary | ICD-10-CM | POA: Diagnosis not present

## 2019-10-18 DIAGNOSIS — G464 Cerebellar stroke syndrome: Secondary | ICD-10-CM | POA: Diagnosis not present

## 2019-10-18 DIAGNOSIS — R278 Other lack of coordination: Secondary | ICD-10-CM | POA: Diagnosis not present

## 2019-10-18 DIAGNOSIS — I10 Essential (primary) hypertension: Secondary | ICD-10-CM | POA: Diagnosis not present

## 2019-10-18 DIAGNOSIS — G629 Polyneuropathy, unspecified: Secondary | ICD-10-CM | POA: Diagnosis not present

## 2019-10-18 DIAGNOSIS — E785 Hyperlipidemia, unspecified: Secondary | ICD-10-CM | POA: Diagnosis present

## 2019-10-18 DIAGNOSIS — E782 Mixed hyperlipidemia: Secondary | ICD-10-CM | POA: Diagnosis not present

## 2019-10-18 DIAGNOSIS — Z8639 Personal history of other endocrine, nutritional and metabolic disease: Secondary | ICD-10-CM | POA: Diagnosis not present

## 2019-10-18 DIAGNOSIS — E875 Hyperkalemia: Secondary | ICD-10-CM | POA: Diagnosis present

## 2019-10-18 DIAGNOSIS — J9601 Acute respiratory failure with hypoxia: Secondary | ICD-10-CM | POA: Diagnosis not present

## 2019-10-18 DIAGNOSIS — G2581 Restless legs syndrome: Secondary | ICD-10-CM | POA: Diagnosis present

## 2019-10-18 DIAGNOSIS — I639 Cerebral infarction, unspecified: Secondary | ICD-10-CM | POA: Diagnosis not present

## 2019-10-18 DIAGNOSIS — N185 Chronic kidney disease, stage 5: Secondary | ICD-10-CM | POA: Diagnosis not present

## 2019-10-18 DIAGNOSIS — M810 Age-related osteoporosis without current pathological fracture: Secondary | ICD-10-CM | POA: Diagnosis present

## 2019-10-29 DIAGNOSIS — I2510 Atherosclerotic heart disease of native coronary artery without angina pectoris: Secondary | ICD-10-CM | POA: Diagnosis not present

## 2019-10-29 DIAGNOSIS — G464 Cerebellar stroke syndrome: Secondary | ICD-10-CM | POA: Diagnosis not present

## 2019-10-29 DIAGNOSIS — E871 Hypo-osmolality and hyponatremia: Secondary | ICD-10-CM | POA: Diagnosis not present

## 2019-10-29 DIAGNOSIS — G9341 Metabolic encephalopathy: Secondary | ICD-10-CM | POA: Diagnosis not present

## 2019-10-29 DIAGNOSIS — E1140 Type 2 diabetes mellitus with diabetic neuropathy, unspecified: Secondary | ICD-10-CM | POA: Diagnosis not present

## 2019-10-29 DIAGNOSIS — I1311 Hypertensive heart and chronic kidney disease without heart failure, with stage 5 chronic kidney disease, or end stage renal disease: Secondary | ICD-10-CM | POA: Diagnosis not present

## 2019-10-29 DIAGNOSIS — N185 Chronic kidney disease, stage 5: Secondary | ICD-10-CM | POA: Diagnosis not present

## 2019-11-01 DIAGNOSIS — G8929 Other chronic pain: Secondary | ICD-10-CM | POA: Diagnosis not present

## 2019-11-01 DIAGNOSIS — I10 Essential (primary) hypertension: Secondary | ICD-10-CM | POA: Diagnosis not present

## 2019-11-01 DIAGNOSIS — N179 Acute kidney failure, unspecified: Secondary | ICD-10-CM | POA: Diagnosis not present

## 2019-11-01 DIAGNOSIS — E1121 Type 2 diabetes mellitus with diabetic nephropathy: Secondary | ICD-10-CM | POA: Diagnosis not present

## 2019-12-02 DEATH — deceased

## 2020-03-01 IMAGING — CT CT HEAD W/O CM
3 series · 14 of 46 positions shown, 16 images · non-contrast
Comparison: Head and cervical spine CT 12/06/2017 and earlier.

CLINICAL DATA: 85-year-old female status post fall out of bed.
Forehead hematoma. On anti coagulation.

EXAM:
CT HEAD WITHOUT CONTRAST
TECHNIQUE: Contiguous axial images were obtained from the base of the skull
through the vertex without intravenous contrast.

[Series 2: head wo · axial · 0.47mm/px · z∈[-131,-11]mm · 8 of 29 slices shown, 10 images]
[im 3/29  brain]
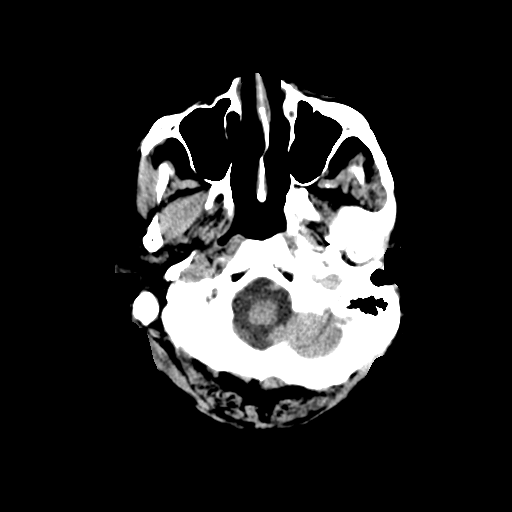
[im 3/29  bone]
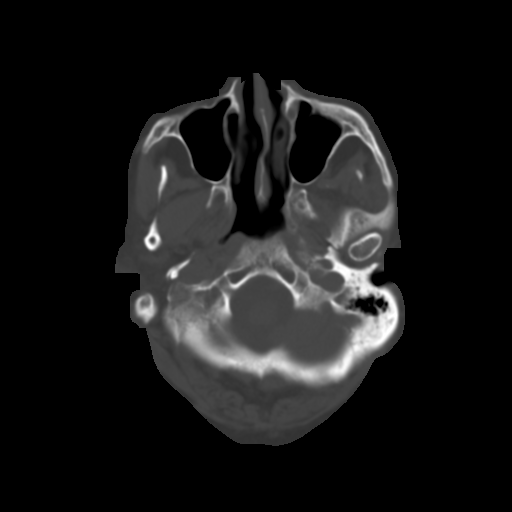
[im 7/29  brain]
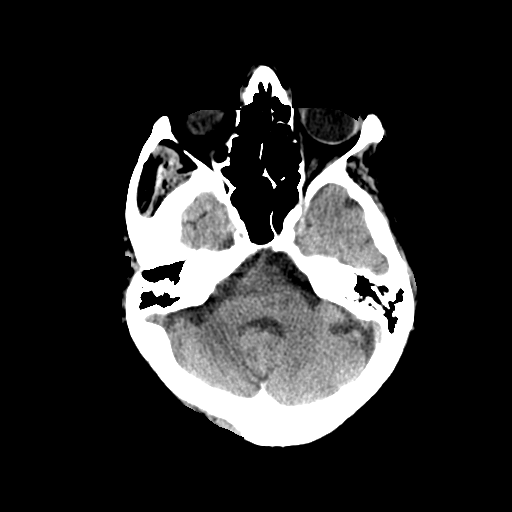
[im 10/29  brain]
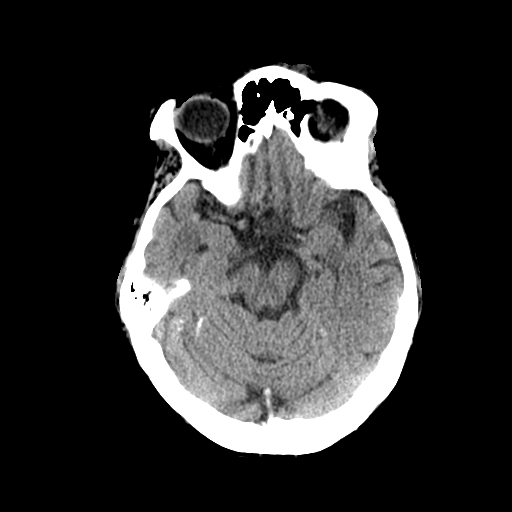
[im 13/29  brain]
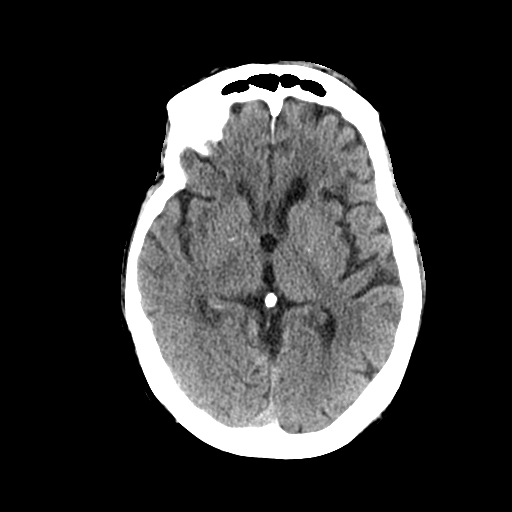
[im 17/29  brain]
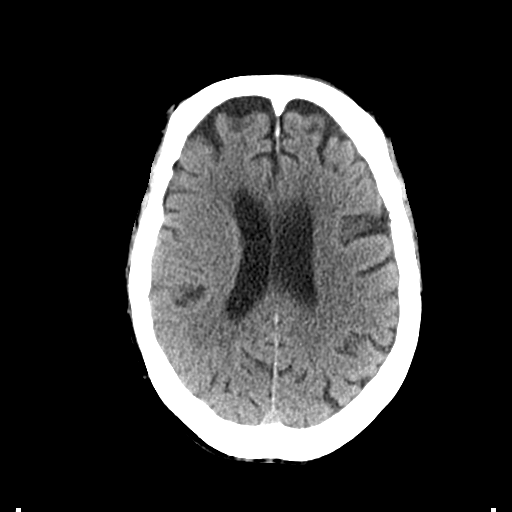
[im 17/29  bone]
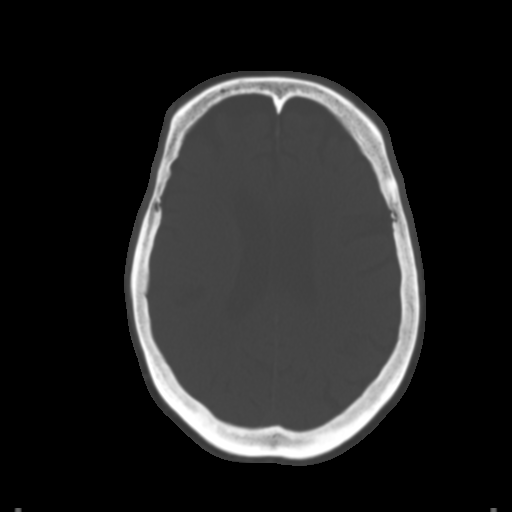
[im 20/29  brain]
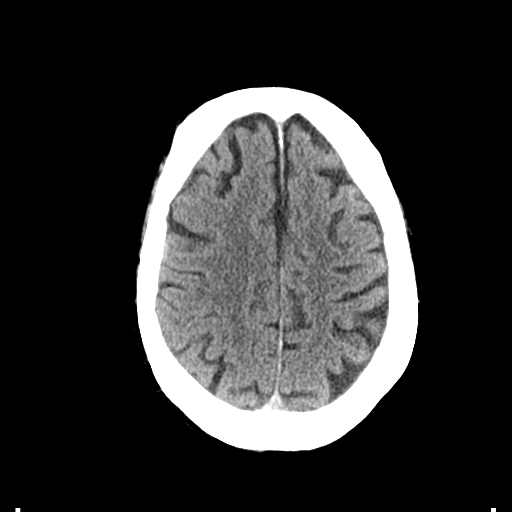
[im 23/29  brain]
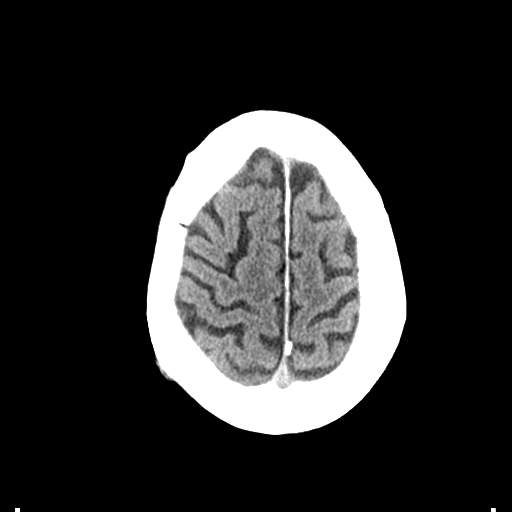
[im 27/29  brain]
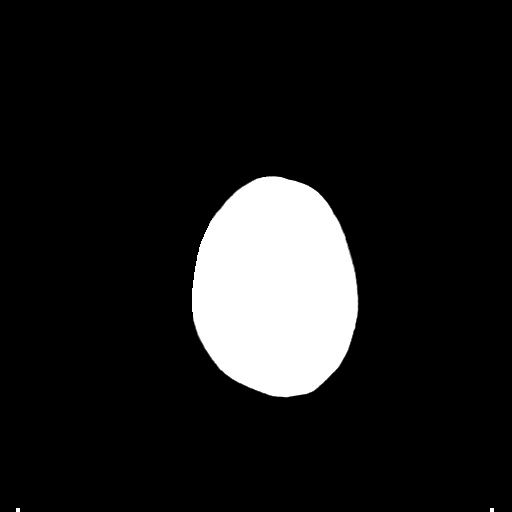

[Series 5: coronal soft tissue · coronal · 0.30mm/px · 3 of 84 slices shown]
[im 28/84  brain]
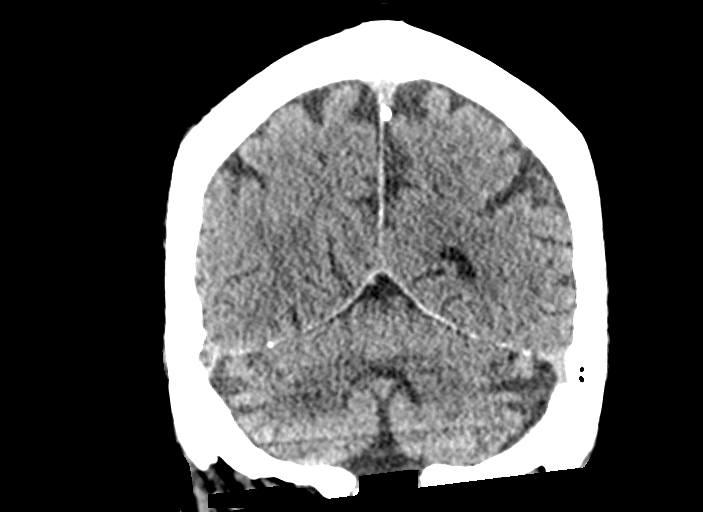
[im 37/84  brain]
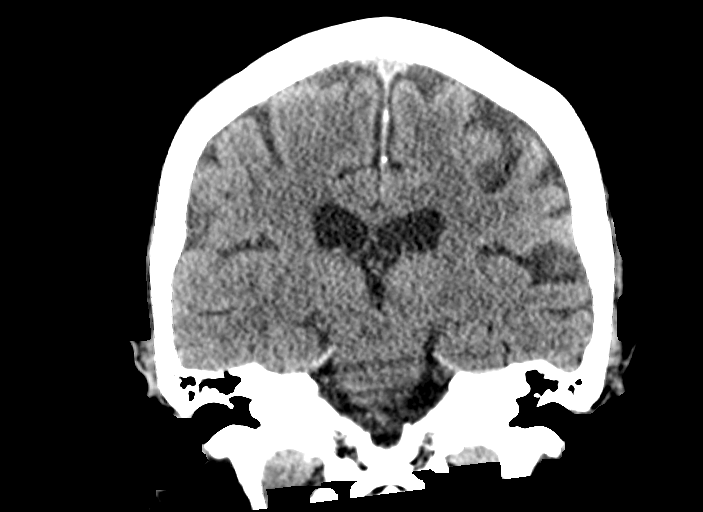
[im 47/84  brain]
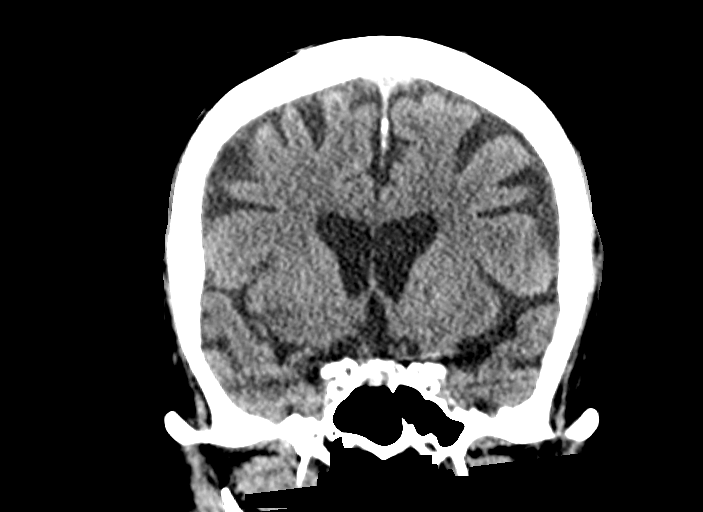

[Series 6: sagittal soft tissue · sagittal · 0.31mm/px · 3 of 71 slices shown]
[im 24/71  brain]
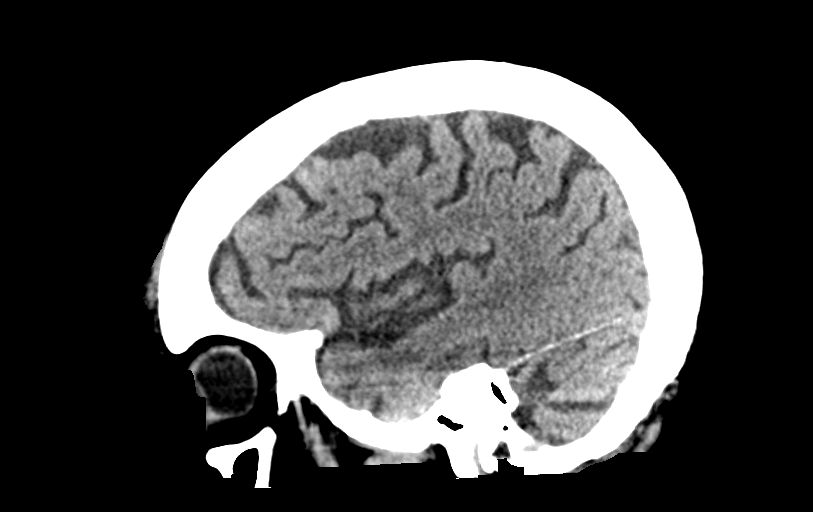
[im 36/71  brain]
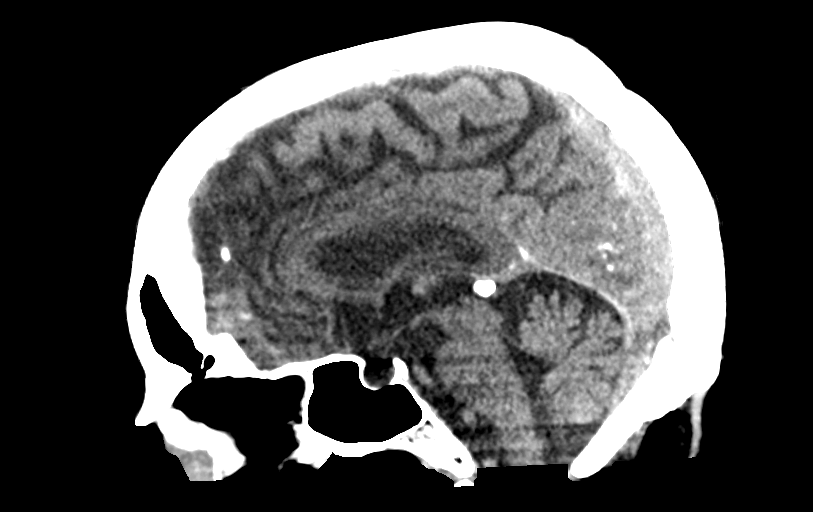
[im 47/71  brain]
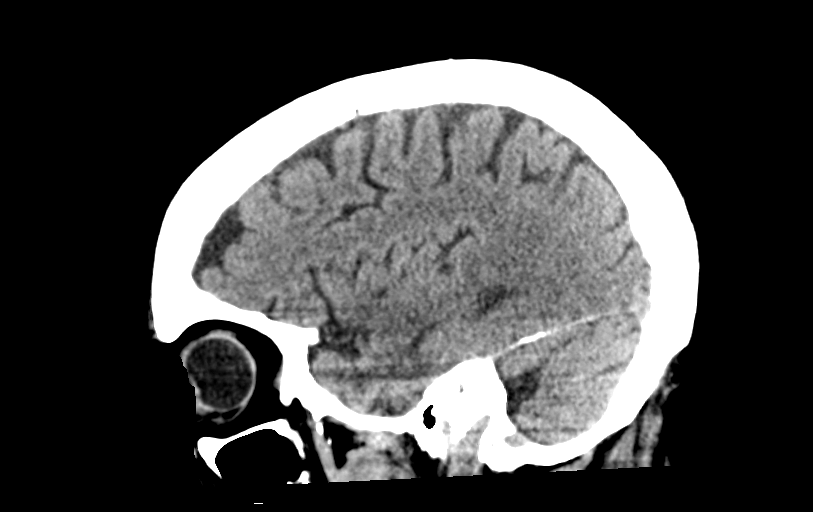

[14 of 46 positions shown; findings below may reference images not displayed]

FINDINGS: Brain: No midline shift, ventriculomegaly, mass effect, evidence of
mass lesion, intracranial hemorrhage or evidence of cortically based
acute infarction. Gray-white matter differentiation is within normal
limits throughout the brain. Stable cerebral volume. No cortical
encephalomalacia identified.

Vascular: Calcified atherosclerosis at the skull base. No suspicious
intracranial vascular hyperdensity.

Skull: Stable. No skull fracture identified. Left nasal bone
fracture re-demonstrated.

Sinuses/Orbits: Visualized paranasal sinuses and mastoids are stable
and well pneumatized.

Other: Regression of the left periorbital and premalar space
hematoma since 12/06/2017 (small residual on series 3, image 1). New
left forehead scalp hematoma measuring up to 8 millimeters in
thickness. Underlying left frontal bone is intact.

Other scalp soft tissues remain within normal limits. Bilateral
orbits soft tissues appear stable and within normal limits.
IMPRESSION: 1. Left forehead scalp hematoma without underlying fracture.
2. Stable and negative for age noncontrast CT appearance of the
brain.
3. Regressed but not completely resolved left face hematoma since
12/06/2017.
